# Patient Record
Sex: Male | Born: 1960 | Race: Black or African American | Hispanic: No | Marital: Married | Smoking: Never smoker
Health system: Southern US, Community
[De-identification: ages and names within clinical notes are randomized; demographics above are authoritative.]

## PROBLEM LIST (undated history)

## (undated) DIAGNOSIS — E739 Lactose intolerance, unspecified: Secondary | ICD-10-CM

## (undated) DIAGNOSIS — R079 Chest pain, unspecified: Secondary | ICD-10-CM

## (undated) DIAGNOSIS — S86019A Strain of unspecified Achilles tendon, initial encounter: Secondary | ICD-10-CM

## (undated) DIAGNOSIS — M25519 Pain in unspecified shoulder: Secondary | ICD-10-CM

## (undated) DIAGNOSIS — G5600 Carpal tunnel syndrome, unspecified upper limb: Secondary | ICD-10-CM

## (undated) DIAGNOSIS — R569 Unspecified convulsions: Secondary | ICD-10-CM

## (undated) DIAGNOSIS — K219 Gastro-esophageal reflux disease without esophagitis: Secondary | ICD-10-CM

## (undated) DIAGNOSIS — E669 Obesity, unspecified: Secondary | ICD-10-CM

## (undated) DIAGNOSIS — M2550 Pain in unspecified joint: Secondary | ICD-10-CM

## (undated) DIAGNOSIS — R42 Dizziness and giddiness: Secondary | ICD-10-CM

## (undated) DIAGNOSIS — M549 Dorsalgia, unspecified: Secondary | ICD-10-CM

## (undated) DIAGNOSIS — K5900 Constipation, unspecified: Secondary | ICD-10-CM

## (undated) DIAGNOSIS — R0602 Shortness of breath: Secondary | ICD-10-CM

## (undated) DIAGNOSIS — R200 Anesthesia of skin: Secondary | ICD-10-CM

## (undated) DIAGNOSIS — M722 Plantar fascial fibromatosis: Secondary | ICD-10-CM

## (undated) HISTORY — PX: VASECTOMY: SHX75

## (undated) HISTORY — DX: Shortness of breath: R06.02

## (undated) HISTORY — DX: Anesthesia of skin: R20.0

## (undated) HISTORY — DX: Obesity, unspecified: E66.9

## (undated) HISTORY — DX: Pain in unspecified joint: M25.50

## (undated) HISTORY — DX: Carpal tunnel syndrome, unspecified upper limb: G56.00

## (undated) HISTORY — DX: Pain in unspecified shoulder: M25.519

## (undated) HISTORY — DX: Gastro-esophageal reflux disease without esophagitis: K21.9

## (undated) HISTORY — DX: Unspecified convulsions: R56.9

## (undated) HISTORY — DX: Lactose intolerance, unspecified: E73.9

## (undated) HISTORY — PX: OTHER SURGICAL HISTORY: SHX169

## (undated) HISTORY — PX: HERNIA REPAIR: SHX51

## (undated) HISTORY — DX: Dizziness and giddiness: R42

## (undated) HISTORY — DX: Chest pain, unspecified: R07.9

## (undated) HISTORY — PX: ACHILLES TENDON REPAIR: SUR1153

## (undated) HISTORY — DX: Plantar fascial fibromatosis: M72.2

## (undated) HISTORY — DX: Strain of unspecified achilles tendon, initial encounter: S86.019A

## (undated) HISTORY — DX: Dorsalgia, unspecified: M54.9

## (undated) HISTORY — DX: Constipation, unspecified: K59.00

---

## 2001-12-29 ENCOUNTER — Encounter (INDEPENDENT_AMBULATORY_CARE_PROVIDER_SITE_OTHER): Payer: Self-pay | Admitting: Specialist

## 2001-12-29 ENCOUNTER — Ambulatory Visit (HOSPITAL_COMMUNITY): Admission: RE | Admit: 2001-12-29 | Discharge: 2001-12-29 | Payer: Self-pay | Admitting: Gastroenterology

## 2003-10-10 ENCOUNTER — Encounter: Admission: RE | Admit: 2003-10-10 | Discharge: 2004-01-08 | Payer: Self-pay | Admitting: Family Medicine

## 2004-12-03 ENCOUNTER — Encounter: Admission: RE | Admit: 2004-12-03 | Discharge: 2004-12-03 | Payer: Self-pay | Admitting: Family Medicine

## 2009-02-20 ENCOUNTER — Ambulatory Visit: Payer: Self-pay | Admitting: Family Medicine

## 2009-02-20 DIAGNOSIS — D492 Neoplasm of unspecified behavior of bone, soft tissue, and skin: Secondary | ICD-10-CM

## 2009-04-05 ENCOUNTER — Ambulatory Visit: Payer: Self-pay | Admitting: Family Medicine

## 2009-04-05 DIAGNOSIS — R599 Enlarged lymph nodes, unspecified: Secondary | ICD-10-CM | POA: Insufficient documentation

## 2009-04-08 ENCOUNTER — Telehealth (INDEPENDENT_AMBULATORY_CARE_PROVIDER_SITE_OTHER): Payer: Self-pay | Admitting: *Deleted

## 2009-04-08 LAB — CONVERTED CEMR LAB
ALT: 50 units/L (ref 0–53)
AST: 91 units/L — ABNORMAL HIGH (ref 0–37)
Albumin: 3.5 g/dL (ref 3.5–5.2)
Alkaline Phosphatase: 53 units/L (ref 39–117)
BUN: 14 mg/dL (ref 6–23)
Basophils Absolute: 0 10*3/uL (ref 0.0–0.1)
Basophils Relative: 0 % (ref 0.0–3.0)
Bilirubin, Direct: 0 mg/dL (ref 0.0–0.3)
CO2: 33 meq/L — ABNORMAL HIGH (ref 19–32)
Calcium: 8.9 mg/dL (ref 8.4–10.5)
Chloride: 102 meq/L (ref 96–112)
Cholesterol: 186 mg/dL (ref 0–200)
Creatinine, Ser: 1 mg/dL (ref 0.4–1.5)
Eosinophils Absolute: 0.2 10*3/uL (ref 0.0–0.7)
Eosinophils Relative: 2.6 % (ref 0.0–5.0)
GFR calc non Af Amer: 103.4 mL/min (ref >60)
Glucose, Bld: 80 mg/dL (ref 70–99)
HCT: 46.4 % (ref 39.0–52.0)
HDL: 38.7 mg/dL — ABNORMAL LOW (ref >39.00)
Hemoglobin: 15.6 g/dL (ref 13.0–17.0)
LDL Cholesterol: 134 mg/dL — ABNORMAL HIGH (ref 0–99)
Lymphocytes Relative: 28.9 % (ref 12.0–46.0)
Lymphs Abs: 1.8 10*3/uL (ref 0.7–4.0)
MCHC: 33.6 g/dL (ref 30.0–36.0)
MCV: 88.1 fL (ref 78.0–100.0)
Monocytes Absolute: 0.4 10*3/uL (ref 0.1–1.0)
Monocytes Relative: 6.8 % (ref 3.0–12.0)
Neutro Abs: 3.7 10*3/uL (ref 1.4–7.7)
Neutrophils Relative %: 61.7 % (ref 43.0–77.0)
Platelets: 224 10*3/uL (ref 150.0–400.0)
Potassium: 3.3 meq/L — ABNORMAL LOW (ref 3.5–5.1)
RBC: 5.27 M/uL (ref 4.22–5.81)
RDW: 12.4 % (ref 11.5–14.6)
Sodium: 141 meq/L (ref 135–145)
TSH: 1.23 microintl units/mL (ref 0.35–5.50)
Total Bilirubin: 1.1 mg/dL (ref 0.3–1.2)
Total CHOL/HDL Ratio: 5
Total Protein: 7.1 g/dL (ref 6.0–8.3)
Triglycerides: 67 mg/dL (ref 0.0–149.0)
VLDL: 13.4 mg/dL (ref 0.0–40.0)
WBC: 6.1 10*3/uL (ref 4.5–10.5)

## 2009-04-26 ENCOUNTER — Ambulatory Visit: Payer: Self-pay | Admitting: Family Medicine

## 2009-04-30 ENCOUNTER — Encounter (INDEPENDENT_AMBULATORY_CARE_PROVIDER_SITE_OTHER): Payer: Self-pay | Admitting: *Deleted

## 2009-04-30 LAB — CONVERTED CEMR LAB
ALT: 53 units/L (ref 0–53)
AST: 38 units/L — ABNORMAL HIGH (ref 0–37)
Albumin: 3.9 g/dL (ref 3.5–5.2)
Indirect Bilirubin: 0.4 mg/dL (ref 0.0–0.9)
Total Bilirubin: 0.5 mg/dL (ref 0.3–1.2)
Total Protein: 7.4 g/dL (ref 6.0–8.3)

## 2009-11-08 ENCOUNTER — Encounter: Payer: Self-pay | Admitting: Internal Medicine

## 2010-10-17 ENCOUNTER — Ambulatory Visit (HOSPITAL_BASED_OUTPATIENT_CLINIC_OR_DEPARTMENT_OTHER): Admission: RE | Admit: 2010-10-17 | Discharge: 2010-10-17 | Payer: Self-pay | Admitting: General Surgery

## 2011-03-11 LAB — DIFFERENTIAL
Basophils Absolute: 0 10*3/uL (ref 0.0–0.1)
Basophils Relative: 1 % (ref 0–1)
Eosinophils Absolute: 0.2 10*3/uL (ref 0.0–0.7)
Eosinophils Relative: 2 % (ref 0–5)
Lymphocytes Relative: 35 % (ref 12–46)
Lymphs Abs: 2.4 10*3/uL (ref 0.7–4.0)
Monocytes Absolute: 0.3 10*3/uL (ref 0.1–1.0)
Monocytes Relative: 5 % (ref 3–12)
Neutro Abs: 3.9 10*3/uL (ref 1.7–7.7)
Neutrophils Relative %: 57 % (ref 43–77)

## 2011-03-11 LAB — BASIC METABOLIC PANEL
BUN: 23 mg/dL (ref 6–23)
CO2: 26 mEq/L (ref 19–32)
Calcium: 8.7 mg/dL (ref 8.4–10.5)
Chloride: 102 mEq/L (ref 96–112)
Creatinine, Ser: 0.92 mg/dL (ref 0.4–1.5)
GFR calc Af Amer: >60 mL/min (ref >60)
GFR calc non Af Amer: >60 mL/min (ref >60)
Glucose, Bld: 89 mg/dL (ref 70–99)
Potassium: 3 mEq/L — ABNORMAL LOW (ref 3.5–5.1)
Sodium: 135 mEq/L (ref 135–145)

## 2011-03-11 LAB — CBC
HCT: 45.5 % (ref 39.0–52.0)
Hemoglobin: 15.4 g/dL (ref 13.0–17.0)
MCH: 28.9 pg (ref 26.0–34.0)
MCHC: 33.8 g/dL (ref 30.0–36.0)
MCV: 85.5 fL (ref 78.0–100.0)
Platelets: 243 10*3/uL (ref 150–400)
RBC: 5.32 MIL/uL (ref 4.22–5.81)
RDW: 12.8 % (ref 11.5–15.5)
WBC: 6.8 10*3/uL (ref 4.0–10.5)

## 2011-05-15 NOTE — Op Note (Signed)
[facility]  Patient:    Bryan Lee, Bryan Lee. Visit Number: [id number] MRN: [id number]          Service Type: Attending:  Everardo All. Madilyn Fireman, M.D. Dictated by:   Everardo All Madilyn Fireman, M.D. Proc. Date: 12/29/01   CC:         Leanne Chang, M.D.   Operative Report  PROCEDURE:  Colonoscopy with polypectomy.  INDICATIONS:  Polyps seen on flexible sigmoidoscopy.  DESCRIPTION OF PROCEDURE:  The patient was placed in the left lateral decubitus position and placed on the pulse monitor with continuous low flow oxygen delivered by nasal cannula. He was sedated with 40 mg IV Demerol and 5 mg IV Versed. The Olympus video colonoscope was inserted into the rectum and advanced to cecum, confirmed by transillumination of McBurneys point and visualization of the ileocecal valve and appendiceal orifice. The prep was good. The cecum, ascending, transverse, descending, and sigmoid colon all appeared normal with no masses, polyps, diverticula, or other mucosal abnormalities. Within the rectum was seen a 1.2 cm friable appearing polyp on a short stalk at approximately 10 cm. This was removed by snare in one piece and retrieved through the anus. No other abnormalities in the rectum were noted. The colonoscope was then withdrawn and the patient was returned to the recovery room in stable condition. He tolerated the procedure well and there were no immediate complications.  IMPRESSION:  Large rectal polyp, otherwise normal colonoscopy.  PLAN:  Await histology to rule out malignancy and to determine interval for future colon screening. Dictated by:   Everardo All Madilyn Fireman, M.D. Attending:  Everardo All. Madilyn Fireman, M.D. DD:  12/29/01 TD:  12/29/01 Job: [id number]

## 2011-06-20 ENCOUNTER — Emergency Department (HOSPITAL_COMMUNITY)
Admission: EM | Admit: 2011-06-20 | Discharge: 2011-06-20 | Disposition: A | Discharge status: Home / Self Care | Payer: PRIVATE HEALTH INSURANCE | Attending: Emergency Medicine | Admitting: Emergency Medicine

## 2011-06-20 ENCOUNTER — Emergency Department (HOSPITAL_COMMUNITY): Payer: PRIVATE HEALTH INSURANCE

## 2011-06-20 DIAGNOSIS — W1809XA Striking against other object with subsequent fall, initial encounter: Secondary | ICD-10-CM | POA: Insufficient documentation

## 2011-06-20 DIAGNOSIS — R079 Chest pain, unspecified: Secondary | ICD-10-CM | POA: Insufficient documentation

## 2011-06-20 DIAGNOSIS — M25519 Pain in unspecified shoulder: Secondary | ICD-10-CM | POA: Insufficient documentation

## 2011-06-20 DIAGNOSIS — IMO0002 Reserved for concepts with insufficient information to code with codable children: Secondary | ICD-10-CM | POA: Insufficient documentation

## 2011-06-20 DIAGNOSIS — S20219A Contusion of unspecified front wall of thorax, initial encounter: Secondary | ICD-10-CM | POA: Insufficient documentation

## 2011-06-20 DIAGNOSIS — Y9355 Activity, bike riding: Secondary | ICD-10-CM | POA: Insufficient documentation

## 2011-07-06 ENCOUNTER — Encounter: Payer: Self-pay | Admitting: Family Medicine

## 2011-07-06 ENCOUNTER — Ambulatory Visit (INDEPENDENT_AMBULATORY_CARE_PROVIDER_SITE_OTHER): Payer: PRIVATE HEALTH INSURANCE | Admitting: Family Medicine

## 2011-07-06 DIAGNOSIS — N644 Mastodynia: Secondary | ICD-10-CM | POA: Insufficient documentation

## 2011-07-06 DIAGNOSIS — R0789 Other chest pain: Secondary | ICD-10-CM

## 2011-07-06 DIAGNOSIS — R071 Chest pain on breathing: Secondary | ICD-10-CM

## 2011-07-06 DIAGNOSIS — T148XXA Other injury of unspecified body region, initial encounter: Secondary | ICD-10-CM

## 2011-07-06 MED ORDER — NAPROXEN 500 MG PO TABS: 500.0000 mg | ORAL_TABLET | Freq: Two times a day (BID) | ORAL | Status: AC

## 2011-07-06 NOTE — Patient Instructions (Signed)
We will call you with your ortho appt Apply heat to the lump on your bum Take the Naproxen twice a day for the next 10 days- w/ food to avoid upset stomach Use heat or ice on the shoulder Call with any questions or concerns Hang in there!!!
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
## 2011-07-06 NOTE — Assessment & Plan Note (Signed)
Not actual shoulder pain but more soft tissue, tendon pain.  ? Teres major/lat strain/partial tear.  Start scheduled NSAIDs.  Refer to ortho.  Reviewed supportive care and red flags that should prompt return.  Pt expressed understanding and is in agreement w/ plan.

## 2011-07-06 NOTE — Assessment & Plan Note (Signed)
Soft tissue mass in R superior buttock consistent w/ hematoma.  Start heat, scheduled NSAIDs. Offered [name], pt declined.  Reviewed supportive care and red flags that should prompt return.  Pt expressed understanding and is in agreement w/ plan.
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
## 2013-09-08 ENCOUNTER — Ambulatory Visit (INDEPENDENT_AMBULATORY_CARE_PROVIDER_SITE_OTHER): Payer: PRIVATE HEALTH INSURANCE | Admitting: Family Medicine

## 2013-09-08 ENCOUNTER — Encounter: Payer: Self-pay | Admitting: Family Medicine

## 2013-09-08 VITALS — BP 118/72 | HR 90 | Temp 99.1°F | Ht 66.0 in | Wt 283.2 lb

## 2013-09-08 DIAGNOSIS — Z0000 Encounter for general adult medical examination without abnormal findings: Secondary | ICD-10-CM

## 2013-09-08 DIAGNOSIS — Z1211 Encounter for screening for malignant neoplasm of colon: Secondary | ICD-10-CM

## 2013-09-08 LAB — CBC WITH DIFFERENTIAL/PLATELET
Basophils Absolute: 0 10*3/uL (ref 0.0–0.1)
Basophils Relative: 0 % (ref 0–1)
Eosinophils Absolute: 0.3 10*3/uL (ref 0.0–0.7)
Eosinophils Relative: 3 % (ref 0–5)
HCT: 43.2 % (ref 39.0–52.0)
Hemoglobin: 14.3 g/dL (ref 13.0–17.0)
Lymphocytes Relative: 30 % (ref 12–46)
Lymphs Abs: 2.8 10*3/uL (ref 0.7–4.0)
MCH: 27.6 pg (ref 26.0–34.0)
MCHC: 33.1 g/dL (ref 30.0–36.0)
MCV: 83.2 fL (ref 78.0–100.0)
Monocytes Absolute: 0.6 10*3/uL (ref 0.1–1.0)
Monocytes Relative: 6 % (ref 3–12)
Neutro Abs: 5.6 10*3/uL (ref 1.7–7.7)
Neutrophils Relative %: 61 % (ref 43–77)
Platelets: 305 10*3/uL (ref 150–400)
RBC: 5.19 MIL/uL (ref 4.22–5.81)
RDW: 14 % (ref 11.5–15.5)
WBC: 9.4 10*3/uL (ref 4.0–10.5)

## 2013-09-08 LAB — LIPID PANEL
Cholesterol: 223 mg/dL — ABNORMAL HIGH (ref 0–200)
HDL: 40 mg/dL (ref >39)
LDL Cholesterol: 142 mg/dL — ABNORMAL HIGH (ref 0–99)
Total CHOL/HDL Ratio: 5.6 Ratio
Triglycerides: 205 mg/dL — ABNORMAL HIGH (ref <150)
VLDL: 41 mg/dL — ABNORMAL HIGH (ref 0–40)

## 2013-09-08 LAB — BASIC METABOLIC PANEL
BUN: 16 mg/dL (ref 6–23)
CO2: 30 mEq/L (ref 19–32)
Calcium: 9.6 mg/dL (ref 8.4–10.5)
Chloride: 98 mEq/L (ref 96–112)
Creat: 0.89 mg/dL (ref 0.50–1.35)
Glucose, Bld: 95 mg/dL (ref 70–99)
Potassium: 3.5 mEq/L (ref 3.5–5.3)
Sodium: 136 mEq/L (ref 135–145)

## 2013-09-08 LAB — HEPATIC FUNCTION PANEL
ALT: 35 U/L (ref 0–53)
AST: 29 U/L (ref 0–37)
Albumin: 4 g/dL (ref 3.5–5.2)
Alkaline Phosphatase: 50 U/L (ref 39–117)
Bilirubin, Direct: 0.1 mg/dL (ref 0.0–0.3)
Indirect Bilirubin: 0.3 mg/dL (ref 0.0–0.9)
Total Bilirubin: 0.4 mg/dL (ref 0.3–1.2)
Total Protein: 7.4 g/dL (ref 6.0–8.3)

## 2013-09-08 MED ORDER — TRAMADOL HCL 50 MG PO TABS: 50.0000 mg | ORAL_TABLET | Freq: Three times a day (TID) | ORAL | Status: DC | PRN

## 2013-09-08 NOTE — Assessment & Plan Note (Signed)
Pt's PE WNL w/ exception of L shoulder pain and obesity.  Check labs.  Pt due for colonoscopy- will refer to GI.  EKG done- see document for interpretation.  Anticipatory guidance provided.
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
## 2013-09-08 NOTE — Patient Instructions (Addendum)
We'll notify you of your lab results and make any changes if needed Go see Ortho about your shoulder Use the Tramadol at night Call with any questions or concerns Hang in there!!

## 2013-09-09 LAB — PSA: PSA: 1.66 ng/mL (ref <=4.00)

## 2013-09-09 LAB — TSH: TSH: 0.769 u[IU]/mL (ref 0.350–4.500)

## 2013-09-13 ENCOUNTER — Encounter: Payer: Self-pay | Admitting: General Practice

## 2013-09-13 ENCOUNTER — Other Ambulatory Visit: Payer: Self-pay | Admitting: General Practice

## 2013-09-13 MED ORDER — ATORVASTATIN CALCIUM 20 MG PO TABS: 20.0000 mg | ORAL_TABLET | Freq: Every day | ORAL | Status: DC

## 2013-09-18 ENCOUNTER — Encounter: Payer: Self-pay | Admitting: Family Medicine

## 2013-10-09 ENCOUNTER — Other Ambulatory Visit: Payer: Self-pay | Admitting: Family Medicine

## 2013-10-09 NOTE — Telephone Encounter (Signed)
Last filled-09/08/2013  Last visit-09/08/2013  Please advise SW

## 2013-10-10 ENCOUNTER — Other Ambulatory Visit: Payer: Self-pay | Admitting: Family Medicine

## 2013-10-10 NOTE — Telephone Encounter (Signed)
This medication was faxed and called in on 10/13,

## 2013-11-02 ENCOUNTER — Other Ambulatory Visit: Payer: Self-pay

## 2014-03-20 ENCOUNTER — Encounter: Payer: Self-pay | Admitting: Family Medicine

## 2014-04-09 ENCOUNTER — Encounter: Payer: Self-pay | Admitting: Family Medicine

## 2014-04-09 ENCOUNTER — Ambulatory Visit (INDEPENDENT_AMBULATORY_CARE_PROVIDER_SITE_OTHER): Payer: PRIVATE HEALTH INSURANCE | Admitting: Family Medicine

## 2014-04-09 VITALS — BP 124/90 | HR 74 | Temp 98.1°F | Resp 16 | Wt 269.2 lb

## 2014-04-09 DIAGNOSIS — M25511 Pain in right shoulder: Secondary | ICD-10-CM

## 2014-04-09 DIAGNOSIS — M25519 Pain in unspecified shoulder: Secondary | ICD-10-CM

## 2014-04-09 DIAGNOSIS — M25512 Pain in left shoulder: Secondary | ICD-10-CM | POA: Insufficient documentation

## 2014-04-09 MED ORDER — TRAMADOL HCL 50 MG PO TABS: ORAL_TABLET | ORAL | Status: DC

## 2014-04-09 MED ORDER — MELOXICAM 15 MG PO TABS: 15.0000 mg | ORAL_TABLET | Freq: Every day | ORAL | Status: DC

## 2014-04-09 NOTE — Progress Notes (Signed)
Pre visit review using our clinic review tool, if applicable. No additional management support is needed unless otherwise documented below in the visit note. 

## 2014-04-09 NOTE — Assessment & Plan Note (Signed)
New.  Concern for shoulder arthropathy.  Start daily NSAIDs, tramadol for night pain.  Refer to ortho.  Reviewed supportive care and red flags that should prompt return.  Pt expressed understanding and is in agreement w/ plan.

## 2014-04-09 NOTE — Patient Instructions (Signed)
Follow up in 1-2 months to recheck cholesterol We'll call you with your ortho appt Start the Mobic daily for inflammation Use the Tramadol as needed for pain ICE! Call with any questions or concerns Hang in there!

## 2014-04-09 NOTE — Progress Notes (Signed)
   Subjective:    Patient ID: Bryan Lee, male    DOB: 05/17/1961, 53 y.o.   MRN: [id number]  HPI R shoulder pain- unable to sleep due to severity of pain.  sxs started worsening 1 month ago.  Pain doesn't worsen w/ motion it 'just hurts'.  Not taking any medication for pain.  No known injury.  R hand dominant.  No change in activity recently.  + numbness radiating down arm and into hand (known hx of carpal tunnel).  Pain is constant at a 5 but will worsen to 10 at night.   Review of Systems For ROS see HPI     Objective:   Physical Exam  Vitals reviewed. Constitutional: He appears well-developed and well-nourished. No distress.  Cardiovascular: Intact distal pulses.   Musculoskeletal: He exhibits no edema.  R shoulder pain w/o focal TTP over clavicle, scapula, head of humerus Normal ROM of R shoulder, mild pain w/ impingement signs but pt reports the pain is actually unchanged from baseline  Neurological: He has normal reflexes. No cranial nerve deficit. Coordination normal.          Assessment & Plan:

## 2014-04-10 ENCOUNTER — Encounter: Payer: Self-pay | Admitting: Family Medicine

## 2014-04-10 MED ORDER — NONFORMULARY OR COMPOUNDED ITEM: Status: DC

## 2015-11-02 ENCOUNTER — Encounter: Payer: Self-pay | Admitting: Family Medicine

## 2015-11-02 ENCOUNTER — Ambulatory Visit (INDEPENDENT_AMBULATORY_CARE_PROVIDER_SITE_OTHER): Payer: 59 | Admitting: Family Medicine

## 2015-11-02 VITALS — BP 120/64 | HR 74 | Temp 98.4°F | Ht 66.0 in | Wt 289.5 lb

## 2015-11-02 DIAGNOSIS — R202 Paresthesia of skin: Secondary | ICD-10-CM | POA: Diagnosis not present

## 2015-11-02 NOTE — Progress Notes (Signed)
Subjective:   Patient ID: Bryan Lee, male    DOB: 01/05/1961, 54 y.o.   MRN: [id number]  Bryan Lee is a pleasant 54 y.o. year old male pt pt of Dr. Birdie Riddle, new to me, who presents to weekend clinic today with Numbness  and several other complaints on 11/02/2015  HPI:  Right leg numbness- extends from lateral thigh down to his lower leg.  Has been ongoing for 6 or more months.  No known injury.  Nothing seems to make it better or worse.  Does not wake him up at night.  Feels like "pins and needles" and "hot and cold." No back pain. No dysuria.  No blood in stool.  Does not take any medication regularly other than flomax for BPH (followed by [facility]).  Obesity- says he wants to see his PCP and he loves her but since she is changing offices, he cannot get in with her until 2017.  Interested in discussing weight loss options.     Current Outpatient Prescriptions on File Prior to Visit  Medication Sig Dispense Refill  . NONFORMULARY OR COMPOUNDED ITEM Therapeutic Massage as needed for neck pain 1 each 5   No current facility-administered medications on file prior to visit.    No Known Allergies  Past Medical History  Diagnosis Date  . Lipoma     Past Surgical History  Procedure Laterality Date  . Hernia repair    . Achilles tendon repair      left  . Vasectomy      Family History  Problem Relation Age of Onset  . Coronary artery disease Father   . Heart failure Father   . Diabetes Mother   . Diabetes      grandmother  . Hypertension Father     Social History   Social History  . Marital Status: Married    Spouse Name: N/A  . Number of Children: N/A  . Years of Education: N/A   Occupational History  . Not on file.   Social History Main Topics  . Smoking status: Never Smoker   . Smokeless tobacco: Not on file  . Alcohol Use: Yes     Comment: social  . Drug Use: No  . Sexual Activity: Not on file   Other Topics Concern  . Not on file     Social History Narrative   The PMH, PSH, Social History, Family History, Medications, and allergies have been reviewed in [facility], and have been updated if relevant.   Review of Systems  Constitutional: Negative.   HENT: Negative.   Respiratory: Negative.   Cardiovascular: Negative.   Gastrointestinal: Negative.   Genitourinary: Negative.   Musculoskeletal: Negative for back pain and gait problem.  Neurological: Positive for numbness. Negative for dizziness, tremors, seizures, syncope, facial asymmetry, speech difficulty, weakness, light-headedness and headaches.  Hematological: Negative.   Psychiatric/Behavioral: Negative.   All other systems reviewed and are negative.      Objective:    BP 120/64 mmHg  Pulse 74  Temp(Src) 98.4 F (36.9 C) (Oral)  Ht 5\' 6"  (1.676 m)  Wt 289 lb 8 oz (131.316 kg)  BMI 46.75 kg/m2  SpO2 93%   Physical Exam  Constitutional: He is oriented to person, place, and time. He appears well-developed and well-nourished. No distress.  obese  HENT:  Head: Normocephalic.  Eyes: Conjunctivae are normal.  Cardiovascular: Normal rate.   Pulmonary/Chest: Effort normal.  Musculoskeletal:  SLR positive right, mildly positive Faber's right Sensation of  right leg and thigh in tact Reflexes symmetrical No TTP over spine   Neurological: He is alert and oriented to person, place, and time. No cranial nerve deficit.  Skin: Skin is warm and dry.  Psychiatric: He has a normal mood and affect. His behavior is normal. Judgment and thought content normal.  Nursing note and vitals reviewed.         Assessment & Plan:   Paresthesia of right leg  Morbid obesity, unspecified obesity type ([place]) No Follow-up on file.

## 2015-11-02 NOTE — Patient Instructions (Signed)
Great to see you. Please call my office on Monday- [phone] and let them know that I said you could establish with me this week.

## 2015-11-02 NOTE — Assessment & Plan Note (Signed)
Chronic issue-  >25 minutes spent in face to face time with patient, >50% spent in counselling or coordination of care discussing paresthesias and weight loss. Pinched/inflammed nerve likely coming from lower LS spine.  Exam reassuring otherwise and he is aware that his weight is playing a roll. Explained that I will not prescribe weight loss drugs since I am not his PCP.  He asked if he could transfer care to me, which needs to be cleared by his PCP first.   See AVS.  He does not want imaging or rx (prednsione, elavil) for his parethesia at this time. I did discuss red flag symptoms.

## 2015-11-07 ENCOUNTER — Telehealth: Payer: Self-pay | Admitting: *Deleted

## 2015-11-07 ENCOUNTER — Other Ambulatory Visit: Payer: Self-pay | Admitting: General Practice

## 2015-11-07 ENCOUNTER — Ambulatory Visit (INDEPENDENT_AMBULATORY_CARE_PROVIDER_SITE_OTHER): Payer: 59 | Admitting: Family Medicine

## 2015-11-07 ENCOUNTER — Encounter: Payer: Self-pay | Admitting: Family Medicine

## 2015-11-07 DIAGNOSIS — R202 Paresthesia of skin: Secondary | ICD-10-CM

## 2015-11-07 DIAGNOSIS — E876 Hypokalemia: Secondary | ICD-10-CM

## 2015-11-07 LAB — CBC WITH DIFFERENTIAL/PLATELET
BASOS ABS: 0 10*3/uL (ref 0.0–0.1)
Basophils Relative: 0.2 % (ref 0.0–3.0)
EOS ABS: 0.2 10*3/uL (ref 0.0–0.7)
Eosinophils Relative: 2.7 % (ref 0.0–5.0)
HCT: 46.1 % (ref 39.0–52.0)
HEMOGLOBIN: 15.3 g/dL (ref 13.0–17.0)
LYMPHS ABS: 1.9 10*3/uL (ref 0.7–4.0)
Lymphocytes Relative: 21.1 % (ref 12.0–46.0)
MCHC: 33.1 g/dL (ref 30.0–36.0)
MCV: 83.3 fl (ref 78.0–100.0)
MONO ABS: 0.5 10*3/uL (ref 0.1–1.0)
Monocytes Relative: 5.2 % (ref 3.0–12.0)
NEUTROS PCT: 70.8 % (ref 43.0–77.0)
Neutro Abs: 6.2 10*3/uL (ref 1.4–7.7)
Platelets: 329 10*3/uL (ref 150.0–400.0)
RBC: 5.54 Mil/uL (ref 4.22–5.81)
RDW: 14 % (ref 11.5–15.5)
WBC: 8.8 10*3/uL (ref 4.0–10.5)

## 2015-11-07 LAB — LIPID PANEL
CHOL/HDL RATIO: 6
CHOLESTEROL: 219 mg/dL — AB (ref 0–200)
HDL: 34.5 mg/dL — AB (ref >39.00)
LDL Cholesterol: 148 mg/dL — ABNORMAL HIGH (ref 0–99)
NonHDL: 184.53
TRIGLYCERIDES: 183 mg/dL — AB (ref 0.0–149.0)
VLDL: 36.6 mg/dL (ref 0.0–40.0)

## 2015-11-07 LAB — HEPATIC FUNCTION PANEL
ALBUMIN: 3.8 g/dL (ref 3.5–5.2)
ALK PHOS: 54 U/L (ref 39–117)
ALT: 31 U/L (ref 0–53)
AST: 27 U/L (ref 0–37)
Bilirubin, Direct: 0.1 mg/dL (ref 0.0–0.3)
TOTAL PROTEIN: 7.8 g/dL (ref 6.0–8.3)
Total Bilirubin: 0.5 mg/dL (ref 0.2–1.2)

## 2015-11-07 LAB — BASIC METABOLIC PANEL
BUN: 10 mg/dL (ref 6–23)
CHLORIDE: 95 meq/L — AB (ref 96–112)
CO2: 35 mEq/L — ABNORMAL HIGH (ref 19–32)
CREATININE: 0.88 mg/dL (ref 0.40–1.50)
Calcium: 9.7 mg/dL (ref 8.4–10.5)
GFR: 116.63 mL/min (ref >60.00)
GLUCOSE: 123 mg/dL — AB (ref 70–99)
POTASSIUM: 2.7 meq/L — AB (ref 3.5–5.1)
Sodium: 139 mEq/L (ref 135–145)

## 2015-11-07 LAB — TSH: TSH: 0.96 u[IU]/mL (ref 0.35–4.50)

## 2015-11-07 LAB — HEMOGLOBIN A1C: Hgb A1c MFr Bld: 5.8 % (ref 4.6–6.5)

## 2015-11-07 MED ORDER — PHENTERMINE-TOPIRAMATE ER 3.75-23 MG PO CP24: ORAL_CAPSULE | ORAL | Status: DC

## 2015-11-07 MED ORDER — POTASSIUM CHLORIDE CRYS ER 20 MEQ PO TBCR: 20.0000 meq | EXTENDED_RELEASE_TABLET | Freq: Every day | ORAL | Status: DC

## 2015-11-07 NOTE — Telephone Encounter (Signed)
elam lab reporting critical -- potassium @ 2.7

## 2015-11-07 NOTE — Progress Notes (Signed)
   Subjective:    Patient ID: Bryan Lee, male    DOB: Aug 16, 1961, 54 y.o.   MRN: [id number]  HPI Leg pain/numbness- pt was seen on 11/5 at Saturday clinic for R leg numbness.  Pt was having intermittent leg pain/numbness while undergoing PT and PT recommended he see MD.  Was told that he needs to lose weight to take pressure off L spine.  No interest in pred taper, gabapentin.  'i know what I need to do, I need to lose weight'.  Obesity- chronic problem, pt has done research into weight loss options and is asking for medication to assist w/ this.  Pt is particularly interested in Qsymia.  Not interested in bariatric surgery.  Pt is having difficulty exercising due to weight.  Pt is not interested in nutrition at this time.  Denies CP, SOB, HAs, visual changes, edema.   Review of Systems For ROS see HPI     Objective:   Physical Exam  Constitutional: He is oriented to person, place, and time. He appears well-developed and well-nourished. No distress.  obese  HENT:  Head: Normocephalic and atraumatic.  Eyes: Conjunctivae and EOM are normal. Pupils are equal, round, and reactive to light.  Neck: Normal range of motion. Neck supple. No thyromegaly present.  Cardiovascular: Normal rate, regular rhythm, normal heart sounds and intact distal pulses.   Pulmonary/Chest: Effort normal and breath sounds normal. No respiratory distress. He has no wheezes. He has no rales.  Lymphadenopathy:    He has no cervical adenopathy.  Neurological: He is alert and oriented to person, place, and time. He has normal reflexes. No cranial nerve deficit. Coordination normal.  Skin: Skin is warm and dry.  Psychiatric: He has a normal mood and affect. His behavior is normal. Thought content normal.  Vitals reviewed.         Assessment & Plan:

## 2015-11-07 NOTE — Progress Notes (Signed)
Pre visit review using our clinic review tool, if applicable. No additional management support is needed unless otherwise documented below in the visit note. 

## 2015-11-07 NOTE — Patient Instructions (Signed)
Follow up in 6-8 weeks to recheck weight loss progress We'll notify you of your lab results and make any changes if needed Start the Qsymia as directed- 1 capsule daily x2 weeks and then increase to 2 capsules daily Continue to work on healthy diet and regular exercise Call with any questions or concerns If you want to join [name] at the new [place] office, any scheduled appointments will automatically transfer and we will see you at [address] [name] [address] Bryan Lee [place], [place] [address]  Happy Holidays!!!

## 2015-11-10 NOTE — Assessment & Plan Note (Signed)
New to provider, ongoing for pt.  He is not interested in Prednisone or gabapentin.  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  Reviewed supportive care and red flags that should prompt return.  Pt expressed understanding and is in agreement w/ plan.

## 2015-11-10 NOTE — Assessment & Plan Note (Signed)
Ongoing struggle for pt.  He had previously lost quite a bit of weight but has regained most of it.  Check labs to risk stratify.  Discussed medication options- he wants to try Qsymia.  Prescription given.  Offered nutrition referral- pt not interested at this time.  Stressed need for healthy diet and regular exercise.  Will continue to follow.

## 2016-06-01 ENCOUNTER — Encounter: Payer: Self-pay | Admitting: Family Medicine

## 2016-07-08 ENCOUNTER — Telehealth: Payer: Self-pay | Admitting: General Practice

## 2016-07-08 MED ORDER — PHENTERMINE-TOPIRAMATE ER 3.75-23 MG PO CP24: ORAL_CAPSULE | ORAL | Status: DC

## 2016-07-08 NOTE — Telephone Encounter (Signed)
[place] for #30, needs OV

## 2016-07-08 NOTE — Telephone Encounter (Signed)
Medication filled to pharmacy as requested.   

## 2016-07-08 NOTE — Telephone Encounter (Signed)
Last OV 11/07/15 qsymia 11/07/15 #60 with 3  No follow up from first round of weight loss medications

## 2016-09-03 ENCOUNTER — Other Ambulatory Visit: Payer: Self-pay | Admitting: Family Medicine

## 2016-09-03 MED ORDER — PHENTERMINE-TOPIRAMATE ER 3.75-23 MG PO CP24: ORAL_CAPSULE | ORAL | 0 refills | Status: DC

## 2016-09-03 NOTE — Telephone Encounter (Signed)
[place] for #60, no refill w/o appt

## 2016-09-03 NOTE — Telephone Encounter (Signed)
Last OV 11/07/15 (no upcoming appts and pt was not seen for 6-8 week follow up) Qsymia last filled 07/08/16 #60 with 0   Not on med to make appt for weight loss follow up.   Please advise.

## 2017-01-21 ENCOUNTER — Other Ambulatory Visit: Payer: Self-pay | Admitting: Family Medicine

## 2017-02-01 ENCOUNTER — Ambulatory Visit (INDEPENDENT_AMBULATORY_CARE_PROVIDER_SITE_OTHER): Payer: Managed Care, Other (non HMO) | Admitting: Physician Assistant

## 2017-02-01 VITALS — BP 124/82 | HR 75 | Temp 98.6°F | Resp 18 | Ht 66.0 in | Wt 303.0 lb

## 2017-02-01 DIAGNOSIS — N61 Mastitis without abscess: Secondary | ICD-10-CM | POA: Diagnosis not present

## 2017-02-01 MED ORDER — AMOXICILLIN-POT CLAVULANATE 875-125 MG PO TABS: 1.0000 | ORAL_TABLET | Freq: Two times a day (BID) | ORAL | 0 refills | Status: DC
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
## 2017-02-01 NOTE — Patient Instructions (Addendum)
Take the antibiotic with food to reduce the risk of GI upset.  Contact your insurance plan to see what the criteria are to qualify for their coverage of breast reduction surgery.    IF you received an x-ray today, you will receive an invoice from [facility]. Please contact [facility] at [phone] with questions or concerns regarding your invoice.   IF you received labwork today, you will receive an invoice from [facility]. Please contact [facility] at [phone] with questions or concerns regarding your invoice.   Our billing staff will not be able to assist you with questions regarding bills from these companies.  You will be contacted with the lab results as soon as they are available. The fastest way to get your results is to activate your My Chart account. Instructions are located on the last page of this paperwork. If you have not heard from [name] regarding the results in 2 weeks, please contact this office.

## 2017-02-02 ENCOUNTER — Encounter: Payer: Self-pay | Admitting: Physician Assistant

## 2017-02-12 ENCOUNTER — Ambulatory Visit (INDEPENDENT_AMBULATORY_CARE_PROVIDER_SITE_OTHER): Payer: Managed Care, Other (non HMO) | Admitting: Family Medicine

## 2017-02-12 VITALS — BP 112/70 | HR 73 | Temp 98.7°F | Resp 16 | Ht 66.0 in | Wt 302.0 lb

## 2017-02-12 DIAGNOSIS — H6123 Impacted cerumen, bilateral: Secondary | ICD-10-CM | POA: Diagnosis not present

## 2017-02-12 NOTE — Patient Instructions (Addendum)
Return for care as needed.   IF you received an x-ray today, you will receive an invoice from [facility]. Please contact [facility] at [phone] with questions or concerns regarding your invoice.   IF you received labwork today, you will receive an invoice from [place] [facility]. Please contact [facility] at [phone] with questions or concerns regarding your invoice.   Our billing staff will not be able to assist you with questions regarding bills from these companies.  You will be contacted with the lab results as soon as they are available. The fastest way to get your results is to activate your My Chart account. Instructions are located on the last page of this paperwork. If you have not heard from [name] regarding the results in 2 weeks, please contact this office.      Earwax Buildup Your ears make a substance called earwax. It may also be called cerumen. Sometimes, too much earwax builds up in your ear canal. This can cause ear pain and make it harder for you to hear. CAUSES This condition is caused by too much earwax production or buildup. RISK FACTORS The following factors may make you more likely to develop this condition:  Cleaning your ears often with swabs.  Having narrow ear canals.  Having earwax that is overly thick or sticky.  Having eczema.  Being dehydrated. SYMPTOMS Symptoms of this condition include:  Reduced hearing.  Ear drainage.  Ear pain.  Ear itch.  A feeling of fullness in the ear or feeling that the ear is plugged.  Ringing in the ear.  Coughing. DIAGNOSIS Your health care provider can diagnose this condition based on your symptoms and medical history. Your health care provider will also do an ear exam to look inside your ear with a scope (otoscope). You may also have a hearing test. TREATMENT Treatment for this condition includes:  Over-the-counter or prescription ear drops to soften the earwax.  Earwax removal by a health  care provider. This may be done:  By flushing the ear with body-temperature water.  With a medical instrument that has a loop at the end (earwax curette).  With a suction device. HOME CARE INSTRUCTIONS  Take over-the-counter and prescription medicines only as told by your health care provider.  Do not put any objects, including an ear swab, into your ear. You can clean the opening of your ear canal with a washcloth.  Drink enough water to keep your urine clear or pale yellow.  If you have frequent earwax buildup or you use hearing aids, consider seeing your health care provider every 6-12 months for routine preventive ear cleanings. Keep all follow-up visits as told by your health care provider. SEEK MEDICAL CARE IF:  You have ear pain.  Your condition does not improve with treatment.  You have hearing loss.  You have blood, pus, or other fluid coming from your ear. This information is not intended to replace advice given to you by your health care provider. Make sure you discuss any questions you have with your health care provider. Document Released: 01/21/2005 Document Revised: 04/06/2016 Document Reviewed: 07/31/2015 Elsevier Interactive Patient Education  2017 [employer].

## 2017-02-12 NOTE — Progress Notes (Signed)
Patient ID: Bryan Lee, male    DOB: Jul 29, 1961, 56 y.o.   MRN: [id number]  PCP: Annye Asa, MD  Chief Complaint  Patient presents with  . Ear Fullness    Bilateral     Subjective:  HPI  56 year old male presents for evaluation of bilateral ear impaction. He reports diminished hearing bilateral ears. Left ear feels more occluded compared to right. Reports previous episodes of ear impactions. Denies dizziness or ringing in the ear. He has tried peroxide otic drops without improvement of ear wax removal.  Social History   Social History  . Marital status: Married    Spouse name: Rosemarie Ax  . Number of children: 2  . Years of education: Master's Degree   Occupational History  . VP Operations     [employer]   Social History Main Topics  . Smoking status: Never Smoker  . Smokeless tobacco: Never Used  . Alcohol use Yes     Comment: social  . Drug use: No  . Sexual activity: Not on file   Other Topics Concern  . Not on file   Social History Narrative   Lives with his wife and children.    Family History  Problem Relation Age of Onset  . Coronary artery disease Father   . Heart failure Father   . Hypertension Father   . Arthritis Mother   . Diabetes      grandmother  . Cancer Sister     GYN-type cancer  . GER disease Daughter   . GER disease Son   . Diabetes Maternal Grandmother   . Cancer Maternal Grandmother     ovarian  . Cancer Paternal Grandmother     lung cancer, + tobacco  . Seizures Sister    Review of Systems See HPI Patient Active Problem List   Diagnosis Date Noted  . Paresthesia of right leg 11/02/2015  . Morbid obesity ([place]) 11/02/2015  . Shoulder pain, right 04/09/2014  . Routine general medical examination at a health care facility 09/08/2013  . Hematoma 07/06/2011  . Chest wall pain 07/06/2011  . LYMPH NODE-ENLARGED 04/05/2009  . NEOPLASMS UNSPEC NATURE BONE SOFT TISSUE&SKIN 02/20/2009    No Known Allergies  Prior  to Admission medications   Medication Sig Start Date End Date Taking? Authorizing Provider  amoxicillin-clavulanate (AUGMENTIN) 875-125 MG tablet Take 1 tablet by mouth 2 (two) times daily. 02/01/17  Yes Chelle Jeffery, PA-C  KLOR-CON M20 20 MEQ tablet TAKE ONE TABLET BY MOUTH ONCE DAILY Patient not taking: Reported on 02/12/2017 01/21/17   Midge Minium, MD  Phentermine-Topiramate 3.75-23 MG CP24 1 capsule daily x2 weeks and then increase to 2 capsules daily Patient not taking: Reported on 02/12/2017 09/03/16   Midge Minium, MD  tamsulosin (FLOMAX) 0.4 MG CAPS capsule Take 0.4 mg by mouth.    Historical Provider, MD    Past Medical, Surgical Family and Social History reviewed and updated.    Objective:   Today's Vitals   02/12/17 1208  BP: 112/70  Pulse: 73  Resp: 16  Temp: 98.7 F (37.1 C)  TempSrc: Oral  SpO2: 94%  Weight: (!) 302 lb (137 kg)  Height: 5\' 6"  (1.676 m)    Wt Readings from Last 3 Encounters:  02/12/17 (!) 302 lb (137 kg)  02/01/17 (!) 303 lb (137.4 kg)  11/07/15 289 lb 6 oz (131.3 kg)    Physical Exam  Constitutional: He is oriented to person, place, and time. He appears  well-developed and well-nourished.  HENT:  Head: Normocephalic and atraumatic.  Bilateral thick brownish/yellowish cerumen in ears.  Eyes: Conjunctivae and EOM are normal. Pupils are equal, round, and reactive to light.  Neck: Normal range of motion.  Cardiovascular: Normal rate.   Pulmonary/Chest: Effort normal.  Musculoskeletal: Normal range of motion.  Neurological: He is alert and oriented to person, place, and time.  Skin: Skin is warm and dry.  Psychiatric: He has a normal mood and affect. His behavior is normal. Judgment and thought content normal.      Assessment & Plan:  1. Hearing loss of both ears due to cerumen impaction -Patient tolerated with improvement of hearing bilateral ears.    Carroll Sage. Kenton Kingfisher, MSN, FNP-C [facility] at [place]

## 2017-02-18 ENCOUNTER — Encounter (INDEPENDENT_AMBULATORY_CARE_PROVIDER_SITE_OTHER): Payer: Managed Care, Other (non HMO) | Admitting: Family Medicine

## 2017-02-19 ENCOUNTER — Ambulatory Visit (INDEPENDENT_AMBULATORY_CARE_PROVIDER_SITE_OTHER): Payer: Managed Care, Other (non HMO) | Admitting: Family Medicine

## 2017-02-19 VITALS — BP 120/78 | HR 74 | Temp 98.8°F | Resp 16 | Ht 65.0 in | Wt 298.9 lb

## 2017-02-19 DIAGNOSIS — Z1212 Encounter for screening for malignant neoplasm of rectum: Secondary | ICD-10-CM

## 2017-02-19 DIAGNOSIS — Z23 Encounter for immunization: Secondary | ICD-10-CM

## 2017-02-19 DIAGNOSIS — Z1211 Encounter for screening for malignant neoplasm of colon: Secondary | ICD-10-CM | POA: Diagnosis not present

## 2017-02-19 DIAGNOSIS — Z113 Encounter for screening for infections with a predominantly sexual mode of transmission: Secondary | ICD-10-CM | POA: Diagnosis not present

## 2017-02-19 NOTE — Progress Notes (Signed)
Subjective:  By signing my name below, I, [name], attest that this documentation has been prepared under the direction and in the presence of Delman Cheadle, MD Electronically Signed: Ladene Artist, ED Scribe 02/19/2017 at 4:29 PM.   Patient ID: Bryan Lee, male    DOB: [birth year], 56 y.o.   MRN: [id number]  Chief Complaint  Patient presents with  . Bloodwork   HPI Bryan Lee is a 56 y.o. male who presents to Primary Care at [facility] for blood work. Pt states that he logged into MyChart which showed that he was overdue for a tetanus and hep screen. He is unsure when his last tetanus was. Pt's Health Maintenance also shows that he is overdue for a colonoscopy but he believes he had one before the age of 59. Per chart review, pt last had a colonoscopy done in 2003 that showed a rectal polyp. He is not fasting at this visit but has an upcoming appointment next week.   PCP: Annye Asa, MD  Past Medical History:  Diagnosis Date  . Lipoma    Current Outpatient Prescriptions on File Prior to Visit  Medication Sig Dispense Refill  . KLOR-CON M20 20 MEQ tablet TAKE ONE TABLET BY MOUTH ONCE DAILY (Patient not taking: Reported on 02/12/2017) 30 tablet 0  . Phentermine-Topiramate 3.75-23 MG CP24 1 capsule daily x2 weeks and then increase to 2 capsules daily (Patient not taking: Reported on 02/12/2017) 60 capsule 0  . tamsulosin (FLOMAX) 0.4 MG CAPS capsule Take 0.4 mg by mouth.     No current facility-administered medications on file prior to visit.    No Known Allergies  Past Surgical History:  Procedure Laterality Date  . ACHILLES TENDON REPAIR     left  . HERNIA REPAIR    . lump removal    . VASECTOMY     Family History  Problem Relation Age of Onset  . Coronary artery disease Father   . Heart failure Father   . Hypertension Father   . Sleep apnea Father   . Heart disease Father   . Arthritis Mother   . Obesity Mother   . Diabetes      grandmother  . Cancer  Sister     GYN-type cancer  . GER disease Daughter   . GER disease Son   . Diabetes Maternal Grandmother   . Cancer Maternal Grandmother     ovarian  . Cancer Paternal Grandmother     lung cancer, + tobacco  . Seizures Sister    Social History   Social History  . Marital status: Married    Spouse name: Rosemarie Ax  . Number of children: 2  . Years of education: Master's Degree   Occupational History  . VP Operations     [employer]   Social History Main Topics  . Smoking status: Never Smoker  . Smokeless tobacco: Never Used  . Alcohol use Yes     Comment: social  . Drug use: No  . Sexual activity: Yes    Partners: Female    Birth control/ protection: Surgical   Other Topics Concern  . None   Social History Narrative   Lives with his wife and children.    Review of Systems  Constitutional: Negative for activity change, appetite change, chills, diaphoresis, fatigue, fever and unexpected weight change.  Gastrointestinal: Negative for abdominal distention, abdominal pain, anal bleeding, blood in stool, constipation, diarrhea, nausea, rectal pain and vomiting.  Skin: Negative for color change,  rash and wound.  Allergic/Immunologic: Negative for immunocompromised state.  Hematological: Negative for adenopathy.      Objective:   Physical Exam  Constitutional: He is oriented to person, place, and time. He appears well-developed and well-nourished. No distress.  HENT:  Head: Normocephalic and atraumatic.  Eyes: Conjunctivae and EOM are normal.  Neck: Neck supple. No tracheal deviation present.  Cardiovascular: Normal rate.   Pulmonary/Chest: Effort normal. No respiratory distress.  Musculoskeletal: Normal range of motion.  Neurological: He is alert and oriented to person, place, and time.  Skin: Skin is warm and dry.  Psychiatric: He has a normal mood and affect. His behavior is normal.  Nursing note and vitals reviewed.  BP 120/78   Pulse 74   Temp 98.8 F (37.1  C) (Oral)   Resp 16   Ht 5\' 5"  (1.651 m)   Wt 298 lb 14.4 oz (135.6 kg)   SpO2 94%   BMI 49.74 kg/m     Assessment & Plan:   1. Need for diphtheria-tetanus-pertussis (Tdap) vaccine   2. Screening for colorectal cancer   3. Routine screening for STI (sexually transmitted infection)   Pt received one of the routine updates over MyChart that he had outstanding health maintenance items and so is here today to get those completed.  Orders Placed This Encounter  Procedures  . Tdap vaccine greater than or equal to 7yo IM  . HIV antibody  . HCV Ab w/Rflx to Verification  . Interpretation:  . Ambulatory referral to Gastroenterology    Referral Priority:   Routine    Referral Type:   Consultation    Referral Reason:   Specialty Services Required    Number of Visits Requested:   1  . Care order/instruction:    Scheduling Instructions:     Complete orders, AVS and go.     I personally performed the services described in this documentation, which was scribed in my presence. The recorded information has been reviewed and considered, and addended by me as needed.   Delman Cheadle, M.D.  Primary Care at [facility] [address] [place], [place] [address] [phone] phone [phone] fax  03/25/17 11:04 PM

## 2017-02-19 NOTE — Patient Instructions (Signed)
     IF you received an x-ray today, you will receive an invoice from [facility]. Please contact [facility] at [phone] with questions or concerns regarding your invoice.   IF you received labwork today, you will receive an invoice from [facility]. Please contact [facility] at [phone] with questions or concerns regarding your invoice.   Our billing staff will not be able to assist you with questions regarding bills from these companies.  You will be contacted with the lab results as soon as they are available. The fastest way to get your results is to activate your My Chart account. Instructions are located on the last page of this paperwork. If you have not heard from us regarding the results in 2 weeks, please contact this office.     

## 2017-02-20 LAB — HIV ANTIBODY (ROUTINE TESTING W REFLEX): HIV Screen 4th Generation wRfx: NONREACTIVE

## 2017-02-20 LAB — HCV AB W/RFLX TO VERIFICATION: HCV Ab: <0.1 s/co ratio (ref 0.0–0.9)

## 2017-02-20 LAB — HCV INTERPRETATION

## 2017-02-24 ENCOUNTER — Encounter (INDEPENDENT_AMBULATORY_CARE_PROVIDER_SITE_OTHER): Payer: Self-pay | Admitting: Family Medicine

## 2017-03-02 NOTE — Telephone Encounter (Signed)
Please help.

## 2017-03-04 ENCOUNTER — Encounter (INDEPENDENT_AMBULATORY_CARE_PROVIDER_SITE_OTHER): Payer: Self-pay | Admitting: Family Medicine

## 2017-03-04 ENCOUNTER — Ambulatory Visit (INDEPENDENT_AMBULATORY_CARE_PROVIDER_SITE_OTHER): Payer: Managed Care, Other (non HMO) | Admitting: Family Medicine

## 2017-03-04 VITALS — BP 117/73 | HR 71 | Temp 98.6°F | Resp 18 | Ht 65.0 in | Wt 294.0 lb

## 2017-03-04 DIAGNOSIS — R0602 Shortness of breath: Secondary | ICD-10-CM

## 2017-03-04 DIAGNOSIS — R5383 Other fatigue: Secondary | ICD-10-CM

## 2017-03-04 DIAGNOSIS — E876 Hypokalemia: Secondary | ICD-10-CM | POA: Diagnosis not present

## 2017-03-04 DIAGNOSIS — Z1389 Encounter for screening for other disorder: Secondary | ICD-10-CM | POA: Diagnosis not present

## 2017-03-04 DIAGNOSIS — Z9189 Other specified personal risk factors, not elsewhere classified: Secondary | ICD-10-CM | POA: Diagnosis not present

## 2017-03-04 DIAGNOSIS — Z1331 Encounter for screening for depression: Secondary | ICD-10-CM

## 2017-03-04 DIAGNOSIS — Z0289 Encounter for other administrative examinations: Secondary | ICD-10-CM

## 2017-03-04 NOTE — Progress Notes (Signed)
Office: [phone]  /  Fax: [phone]   HPI:   Chief Complaint: OBESITY  Bryan Lee (MR# [id number]) is a 56 y.o. male who presents on 03/04/2017 for obesity evaluation and treatment. Current BMI is Body mass index is 48.92 kg/m.Marland Kitchen Bryan Lee has struggled with obesity for years and has been unsuccessful in either losing weight or maintaining long term weight loss. Bryan Lee attended our information session and states he is currently in the action stage of change and ready to dedicate time achieving and maintaining a healthier weight.  Bryan Lee states his family eats meals together he thinks his family will eat healthier with  him he struggles with family and or coworkers weight loss sabotage his desired weight loss is 103 lbs he started gaining weight at age 46 his heaviest weight ever was 300 lbs. he frequently makes poor food choices he frequently eats larger portions than normal  he has binge eating behaviors   Fatigue Bryan Lee feels his energy is lower than it should be. This has worsened with weight gain and has not worsened recently. Bryan Lee admits to daytime somnolence and  admits to waking up still tired. Patient is at risk for obstructive sleep apnea. Patent has a history of symptoms of daytime fatigue and Epworth sleepiness scale. Patient generally gets 5 hours of sleep per night, and states they generally have restless. Snoring is present. Apneic episodes are present. Epworth Sleepiness Score is 10  Dyspnea on exertion Bryan Lee notes increasing shortness of breath with exercising and seems to be worsening over time with weight gain. He notes getting out of breath sooner with activity than he used to. This has not gotten worse recently. Bryan Lee denies orthopnea.  Hypokalemia Bryan Lee is on potassium currently and has been 2.7 in the past. He has no current muscle cramping.  At risk for cardiovascular disease Bryan Lee is at a higher than average risk for cardiovascular disease  due to obesity and hypokalemia. He currently denies any chest pain.  Depression Screen Bryan Lee Food and Mood (modified PHQ-9) score was  Depression screen PHQ 2/9 03/04/2017  Decreased Interest 2  Down, Depressed, Hopeless 1  PHQ - 2 Score 3  Altered sleeping 3  Tired, decreased energy 3  Change in appetite 0  Feeling bad or failure about yourself  0  Trouble concentrating 0  Moving slowly or fidgety/restless 0  Suicidal thoughts 0  PHQ-9 Score 9    ALLERGIES: Allergies  Allergen Reactions  . Lactose Intolerance (Gi) Other (See Comments)    Cramping and gas pain    MEDICATIONS: No current outpatient prescriptions on file prior to visit.   No current facility-administered medications on file prior to visit.     PAST MEDICAL HISTORY: Past Medical History:  Diagnosis Date  . Achilles rupture   . Acid reflux   . Back pain   . Carpal tunnel syndrome   . Chest pain   . Constipation   . Dizzy spells   . Joint pain   . Lactose intolerance   . Lipoma   . Numbness    legs, hands  . Obesity   . Plantar fasciitis   . Shoulder pain    rotator cuff  . SOB (shortness of breath) on exertion     PAST SURGICAL HISTORY: Past Surgical History:  Procedure Laterality Date  . ACHILLES TENDON REPAIR     left  . HERNIA REPAIR    . lump removal    . VASECTOMY      SOCIAL HISTORY:  Social History  Substance Use Topics  . Smoking status: Never Smoker  . Smokeless tobacco: Never Used  . Alcohol use Yes     Comment: social    FAMILY HISTORY: Family History  Problem Relation Age of Onset  . Coronary artery disease Father   . Heart failure Father   . Hypertension Father   . Sleep apnea Father   . Heart disease Father   . Arthritis Mother   . Obesity Mother   . Diabetes      grandmother  . Cancer Sister     GYN-type cancer  . GER disease Daughter   . GER disease Son   . Diabetes Maternal Grandmother   . Cancer Maternal Grandmother     ovarian  . Cancer  Paternal Grandmother     lung cancer, + tobacco  . Seizures Sister     ROS: Review of Systems  Constitutional: Positive for malaise/fatigue.  Respiratory: Positive for shortness of breath (on exertion).   Cardiovascular: Negative for chest pain, orthopnea and claudication.    PHYSICAL EXAM: Blood pressure 117/73, pulse 71, temperature 98.6 F (37 C), temperature source Oral, resp. rate 18, height 5' 5"  (1.651 m), weight 294 lb (133.4 kg), SpO2 93 %. Body mass index is 48.92 kg/m. Physical Exam  Constitutional: He is oriented to person, place, and time. He appears well-developed and well-nourished.  Cardiovascular: Normal rate.   Pulmonary/Chest: Effort normal.  Musculoskeletal: Normal range of motion.  Neurological: He is oriented to person, place, and time.  Skin: Skin is warm and dry.  Psychiatric: He has a normal mood and affect. His behavior is normal.  Vitals reviewed.   RECENT LABS AND TESTS: BMET    Component Value Date/Time   NA 139 11/07/2015 0901   K 2.7 (LL) 11/07/2015 0901   CL 95 (L) 11/07/2015 0901   CO2 35 (H) 11/07/2015 0901   GLUCOSE 123 (H) 11/07/2015 0901   BUN 10 11/07/2015 0901   CREATININE 0.88 11/07/2015 0901   CREATININE 0.89 09/08/2013 1631   CALCIUM 9.7 11/07/2015 0901   GFRNONAA >60 10/17/2010 1312   GFRAA  10/17/2010 1312    >60        The eGFR has been calculated using the MDRD equation. This calculation has not been validated in all clinical situations. eGFR's persistently <60 mL/min signify possible Chronic Kidney Disease.   Lab Results  Component Value Date   HGBA1C 5.8 11/07/2015   No results found for: INSULIN CBC    Component Value Date/Time   WBC 8.8 11/07/2015 0901   RBC 5.54 11/07/2015 0901   HGB 15.3 11/07/2015 0901   HCT 46.1 11/07/2015 0901   PLT 329.0 11/07/2015 0901   MCV 83.3 11/07/2015 0901   MCH 27.6 09/08/2013 1631   MCHC 33.1 11/07/2015 0901   RDW 14.0 11/07/2015 0901   LYMPHSABS 1.9 11/07/2015 0901     MONOABS 0.5 11/07/2015 0901   EOSABS 0.2 11/07/2015 0901   BASOSABS 0.0 11/07/2015 0901   Iron/TIBC/Ferritin/ %Sat No results found for: IRON, TIBC, FERRITIN, IRONPCTSAT Lipid Panel     Component Value Date/Time   CHOL 219 (H) 11/07/2015 0901   TRIG 183.0 (H) 11/07/2015 0901   HDL 34.50 (L) 11/07/2015 0901   CHOLHDL 6 11/07/2015 0901   VLDL 36.6 11/07/2015 0901   LDLCALC 148 (H) 11/07/2015 0901   Hepatic Function Panel     Component Value Date/Time   PROT 7.8 11/07/2015 0901   ALBUMIN 3.8 11/07/2015 0901  AST 27 11/07/2015 0901   ALT 31 11/07/2015 0901   ALKPHOS 54 11/07/2015 0901   BILITOT 0.5 11/07/2015 0901   BILIDIR 0.1 11/07/2015 0901   IBILI 0.3 09/08/2013 1631      Component Value Date/Time   TSH 0.96 11/07/2015 0901   TSH 0.769 09/08/2013 1631   TSH 1.23 04/05/2009 0831    ECG  shows NSR with a rate of 69 BPM INDIRECT CALORIMETER done today shows a VO2 of 220 and a REE of 1530.    ASSESSMENT AND PLAN: Other fatigue - Plan: EKG 12-Lead, Comprehensive metabolic panel, CBC with Differential/Platelet, Hemoglobin A1c, Insulin, random, Lipid Panel With LDL/HDL Ratio, VITAMIN D 25 Hydroxy (Vit-D Deficiency, Fractures), Vitamin B12, Folate, TSH, T4, free, T3  SOB (shortness of breath) on exertion  Hypokalemia  Depression screening  At risk for heart disease  Morbid obesity ([place])  PLAN:  Fatigue Bryan Lee was informed that his fatigue may be related to obesity, depression or many other causes. Labs will be ordered, and in the meanwhile Bryan Lee has agreed to work on diet, exercise and weight loss to help with fatigue. Proper sleep hygiene was discussed including the need for 7-8 hours of quality sleep each night. A sleep study was not ordered based on symptoms and Epworth score.  Dyspnea on exertion Hyder's shortness of breath appears to be obesity related and exercise induced. He has agreed to work on weight loss and gradually increase exercise to treat  his exercise induced shortness of breath. If Bryan Lee follows our instructions and loses weight without improvement of his shortness of breath, we will plan to refer to pulmonology. We will monitor this condition regularly. Bryan Lee agrees to this plan.  Hypokalemia We will check labs and Bryan Lee agrees to follow up in our office in 2 weeks.  Cardiovascular risk counselling Bryan Lee was given extended (at least 30 minutes) coronary artery disease prevention counseling today. He is 56 y.o. male and has risk factors for heart disease including obesity. We discussed intensive lifestyle modifications today with an emphasis on specific weight loss instructions and strategies. Pt was also informed of the importance of increasing exercise and decreasing saturated fats to help prevent heart disease.  Depression Screen Bryan Lee had a mildly positive depression screening. Depression is commonly associated with obesity and often results in emotional eating behaviors. We will monitor this closely and work on CBT to help improve the non-hunger eating patterns. Referral to Psychology may be required if no improvement is seen as he continues in our clinic.  Obesity Bryan Lee is currently in the action stage of change and his goal is to continue with weight loss efforts He has agreed to follow the Category 3 plan Bryan Lee has been instructed to work up to a goal of 150 minutes of combined cardio and strengthening exercise per week for weight loss and overall health benefits. We discussed the following Behavioral Modification Stratagies today: increasing lean protein intake, decreasing simple carbohydrates  and increasing vegetables  Bryan Lee has agreed to follow up with our clinic in 2 weeks. He was informed of the importance of frequent follow up visits to maximize his success with intensive lifestyle modifications for his multiple health conditions. He was informed we would discuss his lab results at his next visit  unless there is a critical issue that needs to be addressed sooner. Bryan Lee agreed to keep his next visit at the agreed upon time to discuss these results.  Corey Skains, am acting as scribe for Dennard Nip, MD  I have reviewed the above documentation for accuracy and completeness, and I agree with the above. -Dennard Nip, MD

## 2017-03-05 LAB — COMPREHENSIVE METABOLIC PANEL
ALBUMIN: 4 g/dL (ref 3.5–5.5)
ALT: 34 IU/L (ref 0–44)
AST: 27 IU/L (ref 0–40)
Albumin/Globulin Ratio: 1.1 — ABNORMAL LOW (ref 1.2–2.2)
Alkaline Phosphatase: 57 IU/L (ref 39–117)
BUN / CREAT RATIO: 12 (ref 9–20)
BUN: 10 mg/dL (ref 6–24)
Bilirubin Total: 0.4 mg/dL (ref 0.0–1.2)
CALCIUM: 9.5 mg/dL (ref 8.7–10.2)
CO2: 33 mmol/L — AB (ref 18–29)
CREATININE: 0.84 mg/dL (ref 0.76–1.27)
Chloride: 93 mmol/L — ABNORMAL LOW (ref 96–106)
GFR calc non Af Amer: 99 mL/min/{1.73_m2} (ref >59)
GFR, EST AFRICAN AMERICAN: 115 mL/min/{1.73_m2} (ref >59)
GLUCOSE: 89 mg/dL (ref 65–99)
Globulin, Total: 3.7 g/dL (ref 1.5–4.5)
Potassium: 3.5 mmol/L (ref 3.5–5.2)
Sodium: 139 mmol/L (ref 134–144)
TOTAL PROTEIN: 7.7 g/dL (ref 6.0–8.5)

## 2017-03-05 LAB — CBC WITH DIFFERENTIAL/PLATELET
Basophils Absolute: 0 10*3/uL (ref 0.0–0.2)
Basos: 0 %
EOS (ABSOLUTE): 0.2 10*3/uL (ref 0.0–0.4)
EOS: 3 %
HEMATOCRIT: 42.9 % (ref 37.5–51.0)
HEMOGLOBIN: 14.3 g/dL (ref 13.0–17.7)
Immature Grans (Abs): 0 10*3/uL (ref 0.0–0.1)
Immature Granulocytes: 0 %
LYMPHS ABS: 2.4 10*3/uL (ref 0.7–3.1)
Lymphs: 28 %
MCH: 27.6 pg (ref 26.6–33.0)
MCHC: 33.3 g/dL (ref 31.5–35.7)
MCV: 83 fL (ref 79–97)
MONOCYTES: 5 %
Monocytes Absolute: 0.4 10*3/uL (ref 0.1–0.9)
Neutrophils Absolute: 5.5 10*3/uL (ref 1.4–7.0)
Neutrophils: 64 %
Platelets: 348 10*3/uL (ref 150–379)
RBC: 5.18 x10E6/uL (ref 4.14–5.80)
RDW: 14.1 % (ref 12.3–15.4)
WBC: 8.5 10*3/uL (ref 3.4–10.8)

## 2017-03-05 LAB — LIPID PANEL WITH LDL/HDL RATIO
Cholesterol, Total: 224 mg/dL — ABNORMAL HIGH (ref 100–199)
HDL: 32 mg/dL — ABNORMAL LOW (ref >39)
LDL Calculated: 166 mg/dL — ABNORMAL HIGH (ref 0–99)
LDL/HDL RATIO: 5.2 ratio — AB (ref 0.0–3.6)
TRIGLYCERIDES: 131 mg/dL (ref 0–149)
VLDL Cholesterol Cal: 26 mg/dL (ref 5–40)

## 2017-03-05 LAB — HEMOGLOBIN A1C
ESTIMATED AVERAGE GLUCOSE: 123 mg/dL
Hgb A1c MFr Bld: 5.9 % — ABNORMAL HIGH (ref 4.8–5.6)

## 2017-03-05 LAB — VITAMIN D 25 HYDROXY (VIT D DEFICIENCY, FRACTURES): Vit D, 25-Hydroxy: 14.6 ng/mL — ABNORMAL LOW (ref 30.0–100.0)

## 2017-03-05 LAB — TSH: TSH: 0.735 u[IU]/mL (ref 0.450–4.500)

## 2017-03-05 LAB — T4, FREE: FREE T4: 1.41 ng/dL (ref 0.82–1.77)

## 2017-03-05 LAB — VITAMIN B12: Vitamin B-12: 732 pg/mL (ref 232–1245)

## 2017-03-05 LAB — INSULIN, RANDOM: INSULIN: 16.2 u[IU]/mL (ref 2.6–24.9)

## 2017-03-05 LAB — T3: T3 TOTAL: 100 ng/dL (ref 71–180)

## 2017-03-05 LAB — FOLATE: Folate: 10 ng/mL (ref >3.0)

## 2017-03-16 ENCOUNTER — Encounter (INDEPENDENT_AMBULATORY_CARE_PROVIDER_SITE_OTHER): Payer: Self-pay | Admitting: Family Medicine

## 2017-03-16 NOTE — Telephone Encounter (Signed)
Please advise 

## 2017-03-18 ENCOUNTER — Encounter (INDEPENDENT_AMBULATORY_CARE_PROVIDER_SITE_OTHER): Payer: Managed Care, Other (non HMO) | Admitting: Family Medicine

## 2017-03-18 ENCOUNTER — Ambulatory Visit (INDEPENDENT_AMBULATORY_CARE_PROVIDER_SITE_OTHER): Payer: Managed Care, Other (non HMO) | Admitting: Family Medicine

## 2017-03-18 VITALS — BP 111/72 | HR 74 | Temp 98.6°F | Ht 65.0 in | Wt 286.0 lb

## 2017-03-18 DIAGNOSIS — E559 Vitamin D deficiency, unspecified: Secondary | ICD-10-CM | POA: Insufficient documentation

## 2017-03-18 DIAGNOSIS — R7303 Prediabetes: Secondary | ICD-10-CM | POA: Diagnosis not present

## 2017-03-18 DIAGNOSIS — Z9189 Other specified personal risk factors, not elsewhere classified: Secondary | ICD-10-CM | POA: Diagnosis not present

## 2017-03-18 MED ORDER — METFORMIN HCL 500 MG PO TABS: 500.0000 mg | ORAL_TABLET | Freq: Every day | ORAL | 0 refills | Status: DC

## 2017-03-18 MED ORDER — VITAMIN D (ERGOCALCIFEROL) 1.25 MG (50000 UNIT) PO CAPS: 50000.0000 [IU] | ORAL_CAPSULE | ORAL | 0 refills | Status: DC

## 2017-03-18 MED FILL — VIT D2 1.25 MG (50,000 UNIT: 1.25 MG | 28 days supply | Qty: 4 | Fill #0

## 2017-03-18 MED FILL — metFORMIN HCL 500 MG TABS: 500 | 30 days supply | Qty: 30 | Fill #0

## 2017-03-18 NOTE — Progress Notes (Signed)
Office: [phone]  /  Fax: [phone]   HPI:   Chief Complaint: OBESITY Bryan Lee is here to discuss his progress with his obesity treatment plan. He is following his eating plan approximately 100 % of the time and states he is exercising 0 minutes 0 times per week. Bryan Lee did well with weight loss but noted significant in-between meals. He missed using sauces His weight is 286 lb (129.7 kg) today and has had a weight loss of 8 pounds over a period of 2 weeks since his last visit. He has lost 8 lbs since starting treatment with [name].  Vitamin D deficiency Bryan Lee has a diagnosis of vitamin D deficiency. He is not currently taking vit D and denies nausea, vomiting or muscle weakness.  Pre-Diabetes Bryan Lee has a diagnosis of prediabetes based on his elevated Hgb A1c at 5.8 and was informed this puts him at greater risk of developing diabetes. He is not taking metformin currently and continues to work on diet and exercise to decrease risk of diabetes. He denies nausea or hypoglycemia.  At risk for diabetes Bryan Lee is at higher than average risk for developing diabetes due to his obesity. He currently denies polyuria or polydipsia.   Wt Readings from Last 500 Encounters:  03/18/17 286 lb (129.7 kg)  03/04/17 294 lb (133.4 kg)  02/19/17 298 lb 14.4 oz (135.6 kg)  02/12/17 (!) 302 lb (137 kg)  02/01/17 (!) 303 lb (137.4 kg)  11/07/15 289 lb 6 oz (131.3 kg)  11/02/15 289 lb 8 oz (131.3 kg)  04/09/14 269 lb 4 oz (122.1 kg)  09/08/13 283 lb 3.2 oz (128.5 kg)  07/06/11 244 lb 6.4 oz (110.9 kg)  11/08/09 (!) 226 lb (102.5 kg)  04/05/09 204 lb 12.8 oz (92.9 kg)  02/20/09 206 lb 12.8 oz (93.8 kg)     ALLERGIES: Allergies  Allergen Reactions  . Lactose Intolerance (Gi) Other (See Comments)    Cramping and gas pain    MEDICATIONS: Current Outpatient Prescriptions on File Prior to Visit  Medication Sig Dispense Refill  . Naproxen Sodium (ALEVE) 220 MG CAPS Take by mouth as  needed.    . potassium chloride SA (K-DUR,KLOR-CON) 20 MEQ tablet Take 20 mEq by mouth daily.     No current facility-administered medications on file prior to visit.     PAST MEDICAL HISTORY: Past Medical History:  Diagnosis Date  . Achilles rupture   . Acid reflux   . Back pain   . Carpal tunnel syndrome   . Chest pain   . Constipation   . Dizzy spells   . Joint pain   . Lactose intolerance   . Lipoma   . Numbness    legs, hands  . Obesity   . Plantar fasciitis   . Shoulder pain    rotator cuff  . SOB (shortness of breath) on exertion     PAST SURGICAL HISTORY: Past Surgical History:  Procedure Laterality Date  . ACHILLES TENDON REPAIR     left  . HERNIA REPAIR    . lump removal    . VASECTOMY      SOCIAL HISTORY: Social History  Substance Use Topics  . Smoking status: Never Smoker  . Smokeless tobacco: Never Used  . Alcohol use Yes     Comment: social    FAMILY HISTORY: Family History  Problem Relation Age of Onset  . Coronary artery disease Father   . Heart failure Father   . Hypertension Father   .  Sleep apnea Father   . Heart disease Father   . Arthritis Mother   . Obesity Mother   . Diabetes      grandmother  . Cancer Sister     GYN-type cancer  . GER disease Daughter   . GER disease Son   . Diabetes Maternal Grandmother   . Cancer Maternal Grandmother     ovarian  . Cancer Paternal Grandmother     lung cancer, + tobacco  . Seizures Sister     ROS: Review of Systems  Constitutional: Positive for malaise/fatigue.  Gastrointestinal: Negative for nausea and vomiting.  Genitourinary: Negative for frequency.  Musculoskeletal:       Negative muscle weakness  Endo/Heme/Allergies: Negative for polydipsia.       Negative hypoglycemia    PHYSICAL EXAM: Blood pressure 111/72, pulse 74, temperature 98.6 F (37 C), temperature source Oral, height 5\' 5"  (1.651 m), weight 286 lb (129.7 kg), SpO2 92 %. Body mass index is 47.59  kg/m. Physical Exam  Constitutional: He is oriented to person, place, and time. He appears well-developed and well-nourished.  Cardiovascular: Normal rate.   Pulmonary/Chest: Effort normal.  Musculoskeletal: Normal range of motion.  Neurological: He is oriented to person, place, and time.  Skin: Skin is warm and dry.  Psychiatric: He has a normal mood and affect. His behavior is normal.  Vitals reviewed.   RECENT LABS AND TESTS: BMET    Component Value Date/Time   NA 139 03/04/2017 1145   K 3.5 03/04/2017 1145   CL 93 (L) 03/04/2017 1145   CO2 33 (H) 03/04/2017 1145   GLUCOSE 89 03/04/2017 1145   GLUCOSE 123 (H) 11/07/2015 0901   BUN 10 03/04/2017 1145   CREATININE 0.84 03/04/2017 1145   CREATININE 0.89 09/08/2013 1631   CALCIUM 9.5 03/04/2017 1145   GFRNONAA 99 03/04/2017 1145   GFRAA 115 03/04/2017 1145   Lab Results  Component Value Date   HGBA1C 5.9 (H) 03/04/2017   HGBA1C 5.8 11/07/2015   Lab Results  Component Value Date   INSULIN 16.2 03/04/2017   CBC    Component Value Date/Time   WBC 8.5 03/04/2017 1145   WBC 8.8 11/07/2015 0901   RBC 5.18 03/04/2017 1145   RBC 5.54 11/07/2015 0901   HGB 15.3 11/07/2015 0901   HCT 42.9 03/04/2017 1145   PLT 348 03/04/2017 1145   MCV 83 03/04/2017 1145   MCH 27.6 03/04/2017 1145   MCH 27.6 09/08/2013 1631   MCHC 33.3 03/04/2017 1145   MCHC 33.1 11/07/2015 0901   RDW 14.1 03/04/2017 1145   LYMPHSABS 2.4 03/04/2017 1145   MONOABS 0.5 11/07/2015 0901   EOSABS 0.2 03/04/2017 1145   BASOSABS 0.0 03/04/2017 1145   Iron/TIBC/Ferritin/ %Sat No results found for: IRON, TIBC, FERRITIN, IRONPCTSAT Lipid Panel     Component Value Date/Time   CHOL 224 (H) 03/04/2017 1145   TRIG 131 03/04/2017 1145   HDL 32 (L) 03/04/2017 1145   CHOLHDL 6 11/07/2015 0901   VLDL 36.6 11/07/2015 0901   LDLCALC 166 (H) 03/04/2017 1145   Hepatic Function Panel     Component Value Date/Time   PROT 7.7 03/04/2017 1145   ALBUMIN 4.0  03/04/2017 1145   AST 27 03/04/2017 1145   ALT 34 03/04/2017 1145   ALKPHOS 57 03/04/2017 1145   BILITOT 0.4 03/04/2017 1145   BILIDIR 0.1 11/07/2015 0901   IBILI 0.3 09/08/2013 1631      Component Value Date/Time   TSH 0.735  03/04/2017 1145   TSH 0.96 11/07/2015 0901   TSH 0.769 09/08/2013 1631    ASSESSMENT AND PLAN: Vitamin D deficiency - Plan: Vitamin D, Ergocalciferol, (DRISDOL) 50000 units CAPS capsule  Prediabetes - Plan: metFORMIN (GLUCOPHAGE) 500 MG tablet  At risk for diabetes mellitus  Morbid obesity ([place])  PLAN:  Vitamin D Deficiency Bryan Lee was informed that low vitamin D levels contributes to fatigue and are associated with obesity, breast, and colon cancer. He agrees to start to take prescription Vit D @50 ,000 IU every week #4 with no refills and will follow up for routine testing of vitamin D, at least 2-3 times per year. He was informed of the risk of over-replacement of vitamin D and agrees to not increase his dose unless he discusses this with [name] first. Bryan Lee agrees to follow up with our clinic in 2 weeks.  Pre-Diabetes Bryan Lee will continue to work on weight loss, exercise, and decreasing simple carbohydrates in his diet to help decrease the risk of diabetes. We dicussed metformin including benefits and risks. He was informed that eating too many simple carbohydrates or too many calories at one sitting increases the likelihood of GI side effects. Bryan Lee requested metformin for now and a prescription was written today for Metformin 500 mg every morning #30 with no refills. Bryan Lee agreed to follow up with [name] as directed to monitor his progress.  Diabetes risk counselling Bryan Lee was given extended (at least 15 minutes) diabetes prevention counseling today. He is 56 y.o. male and has risk factors for diabetes including obesity. We discussed intensive lifestyle modifications today with an emphasis on weight loss as well as increasing exercise and decreasing simple  carbohydrates in his diet.  Obesity Bryan Lee is currently in the action stage of change. As such, his goal is to continue with weight loss efforts He has agreed to follow the Category 3 plan Bryan Lee has been instructed to work up to a goal of 150 minutes of combined cardio and strengthening exercise per week for weight loss and overall health benefits. We discussed the following Behavioral Modification Stratagies today: increasing lean protein intake, decreasing simple carbohydrates , increasing vegetables, decrease eating out and decrease junk food  Bryan Lee has agreed to follow up with our clinic in 2 weeks. He was informed of the importance of frequent follow up visits to maximize his success with intensive lifestyle modifications for his multiple health conditions.  I, Doreene Nest, am acting as scribe for Dennard Nip, MD  I have reviewed the above documentation for accuracy and completeness, and I agree with the above. -Dennard Nip, MD

## 2017-03-19 ENCOUNTER — Encounter: Payer: Self-pay | Admitting: Family Medicine

## 2017-03-19 ENCOUNTER — Encounter (INDEPENDENT_AMBULATORY_CARE_PROVIDER_SITE_OTHER): Payer: Self-pay | Admitting: Family Medicine

## 2017-03-19 NOTE — Telephone Encounter (Signed)
Referral states that [facility] has LM for pt to call and schedule appt. Called pt and he states he never received a call from them. I gave the pt their phone number to reach out to them for scheduling.

## 2017-04-01 ENCOUNTER — Ambulatory Visit (INDEPENDENT_AMBULATORY_CARE_PROVIDER_SITE_OTHER): Payer: Managed Care, Other (non HMO) | Admitting: Family Medicine

## 2017-04-01 VITALS — BP 138/84 | HR 64 | Temp 98.7°F | Ht 65.0 in | Wt 281.0 lb

## 2017-04-01 DIAGNOSIS — R7303 Prediabetes: Secondary | ICD-10-CM | POA: Diagnosis not present

## 2017-04-01 DIAGNOSIS — E559 Vitamin D deficiency, unspecified: Secondary | ICD-10-CM | POA: Diagnosis not present

## 2017-04-01 MED ORDER — VITAMIN D (ERGOCALCIFEROL) 1.25 MG (50000 UNIT) PO CAPS: 50000.0000 [IU] | ORAL_CAPSULE | ORAL | 0 refills | Status: DC

## 2017-04-01 MED ORDER — METFORMIN HCL 500 MG PO TABS: 500.0000 mg | ORAL_TABLET | Freq: Every day | ORAL | 0 refills | Status: DC

## 2017-04-01 NOTE — Progress Notes (Signed)
Office: [phone]  /  Fax: [phone]   HPI:   Chief Complaint: OBESITY Bryan Lee is here to discuss his progress with his obesity treatment plan. He is following his eating plan approximately 100 % of the time and states he is exercising 0 minutes 0 times per week. Bryan Lee continues to do well with weight loss, he is getting bored with breakfast and would like additional options. He would like to start lifting weights. His weight is 281 lb (127.5 kg) today and has had a weight loss of 5 pounds over a period of 2 weeks since his last visit. He has lost 13 lbs since starting treatment with [name].  Pre-Diabetes Bryan Lee has a diagnosis of prediabetes based on his elevated HgA1c and was informed this puts him at greater risk of developing diabetes. He started  taking metformin and had GI upset but no longer. He continues to work on diet and exercise to decrease risk of diabetes. He denies nausea or hypoglycemia.  Vitamin D deficiency Bryan Lee has a diagnosis of vitamin D deficiency. He is currently stable on vit D and denies nausea, vomiting or muscle weakness.  Wt Readings from Last 500 Encounters:  04/01/17 281 lb (127.5 kg)  03/18/17 286 lb (129.7 kg)  03/04/17 294 lb (133.4 kg)  02/19/17 298 lb 14.4 oz (135.6 kg)  02/12/17 (!) 302 lb (137 kg)  02/01/17 (!) 303 lb (137.4 kg)  11/07/15 289 lb 6 oz (131.3 kg)  11/02/15 289 lb 8 oz (131.3 kg)  04/09/14 269 lb 4 oz (122.1 kg)  09/08/13 283 lb 3.2 oz (128.5 kg)  07/06/11 244 lb 6.4 oz (110.9 kg)  11/08/09 (!) 226 lb (102.5 kg)  04/05/09 204 lb 12.8 oz (92.9 kg)  02/20/09 206 lb 12.8 oz (93.8 kg)     ALLERGIES: Allergies  Allergen Reactions  . Lactose Intolerance (Gi) Other (See Comments)    Cramping and gas pain    MEDICATIONS: Current Outpatient Prescriptions on File Prior to Visit  Medication Sig Dispense Refill  . metFORMIN (GLUCOPHAGE) 500 MG tablet Take 1 tablet (500 mg total) by mouth daily with breakfast. 30 tablet 0    . Naproxen Sodium (ALEVE) 220 MG CAPS Take by mouth as needed.    . Vitamin D, Ergocalciferol, (DRISDOL) 50000 units CAPS capsule Take 1 capsule (50,000 Units total) by mouth every 7 (seven) days. 4 capsule 0   No current facility-administered medications on file prior to visit.     PAST MEDICAL HISTORY: Past Medical History:  Diagnosis Date  . Achilles rupture   . Acid reflux   . Back pain   . Carpal tunnel syndrome   . Chest pain   . Constipation   . Dizzy spells   . Joint pain   . Lactose intolerance   . Lipoma   . Numbness    legs, hands  . Obesity   . Plantar fasciitis   . Shoulder pain    rotator cuff  . SOB (shortness of breath) on exertion     PAST SURGICAL HISTORY: Past Surgical History:  Procedure Laterality Date  . ACHILLES TENDON REPAIR     left  . HERNIA REPAIR    . lump removal    . VASECTOMY      SOCIAL HISTORY: Social History  Substance Use Topics  . Smoking status: Never Smoker  . Smokeless tobacco: Never Used  . Alcohol use Yes     Comment: social    FAMILY HISTORY: Family History  Problem  Relation Age of Onset  . Coronary artery disease Father   . Heart failure Father   . Hypertension Father   . Sleep apnea Father   . Heart disease Father   . Arthritis Mother   . Obesity Mother   . Diabetes      grandmother  . Cancer Sister     GYN-type cancer  . GER disease Daughter   . GER disease Son   . Diabetes Maternal Grandmother   . Cancer Maternal Grandmother     ovarian  . Cancer Paternal Grandmother     lung cancer, + tobacco  . Seizures Sister     ROS: Review of Systems  Constitutional: Positive for weight loss.  Gastrointestinal: Negative for diarrhea, nausea and vomiting.  Musculoskeletal:       Negative muscle weakness  Endo/Heme/Allergies:       Negative hypoglycemia    PHYSICAL EXAM: Blood pressure 138/84, pulse 64, temperature 98.7 F (37.1 C), temperature source Oral, height 5\' 5"  (1.651 m), weight 281 lb (127.5  kg), SpO2 95 %. Body mass index is 46.76 kg/m. Physical Exam  Constitutional: He is oriented to person, place, and time. He appears well-developed and well-nourished.  Cardiovascular: Normal rate.   Pulmonary/Chest: Effort normal.  Musculoskeletal: Normal range of motion.  Neurological: He is oriented to person, place, and time.  Skin: Skin is warm and dry.  Psychiatric: He has a normal mood and affect. His behavior is normal.  Vitals reviewed.   RECENT LABS AND TESTS: BMET    Component Value Date/Time   NA 139 03/04/2017 1145   K 3.5 03/04/2017 1145   CL 93 (L) 03/04/2017 1145   CO2 33 (H) 03/04/2017 1145   GLUCOSE 89 03/04/2017 1145   GLUCOSE 123 (H) 11/07/2015 0901   BUN 10 03/04/2017 1145   CREATININE 0.84 03/04/2017 1145   CREATININE 0.89 09/08/2013 1631   CALCIUM 9.5 03/04/2017 1145   GFRNONAA 99 03/04/2017 1145   GFRAA 115 03/04/2017 1145   Lab Results  Component Value Date   HGBA1C 5.9 (H) 03/04/2017   HGBA1C 5.8 11/07/2015   Lab Results  Component Value Date   INSULIN 16.2 03/04/2017   CBC    Component Value Date/Time   WBC 8.5 03/04/2017 1145   WBC 8.8 11/07/2015 0901   RBC 5.18 03/04/2017 1145   RBC 5.54 11/07/2015 0901   HGB 15.3 11/07/2015 0901   HCT 42.9 03/04/2017 1145   PLT 348 03/04/2017 1145   MCV 83 03/04/2017 1145   MCH 27.6 03/04/2017 1145   MCH 27.6 09/08/2013 1631   MCHC 33.3 03/04/2017 1145   MCHC 33.1 11/07/2015 0901   RDW 14.1 03/04/2017 1145   LYMPHSABS 2.4 03/04/2017 1145   MONOABS 0.5 11/07/2015 0901   EOSABS 0.2 03/04/2017 1145   BASOSABS 0.0 03/04/2017 1145   Iron/TIBC/Ferritin/ %Sat No results found for: IRON, TIBC, FERRITIN, IRONPCTSAT Lipid Panel     Component Value Date/Time   CHOL 224 (H) 03/04/2017 1145   TRIG 131 03/04/2017 1145   HDL 32 (L) 03/04/2017 1145   CHOLHDL 6 11/07/2015 0901   VLDL 36.6 11/07/2015 0901   LDLCALC 166 (H) 03/04/2017 1145   Hepatic Function Panel     Component Value Date/Time    PROT 7.7 03/04/2017 1145   ALBUMIN 4.0 03/04/2017 1145   AST 27 03/04/2017 1145   ALT 34 03/04/2017 1145   ALKPHOS 57 03/04/2017 1145   BILITOT 0.4 03/04/2017 1145   BILIDIR 0.1 11/07/2015 0901  IBILI 0.3 09/08/2013 1631      Component Value Date/Time   TSH 0.735 03/04/2017 1145   TSH 0.96 11/07/2015 0901   TSH 0.769 09/08/2013 1631    ASSESSMENT AND PLAN: Prediabetes - Plan: metFORMIN (GLUCOPHAGE) 500 MG tablet  Vitamin D deficiency - Plan: Vitamin D, Ergocalciferol, (DRISDOL) 50000 units CAPS capsule  Morbid obesity ([place])  PLAN:  Pre-Diabetes Dempsey will continue to work on weight loss, exercise, and decreasing simple carbohydrates in his diet to help decrease the risk of diabetes. We dicussed metformin including benefits and risks. He was informed that eating too many simple carbohydrates or too many calories at one sitting increases the likelihood of GI side effects. Dalonte agrees to continue metformin for now and a prescription was written today for 1 month refill. Rishard agreed to follow up with [name] as directed to monitor his progress.  Vitamin D Deficiency Keyon was informed that low vitamin D levels contributes to fatigue and are associated with obesity, breast, and colon cancer. He agrees to continue to take prescription Vit D @50 ,000 IU every week, we will refill for 1 month and will follow up for routine testing of vitamin D, at least 2-3 times per year. He was informed of the risk of over-replacement of vitamin D and agrees to not increase his dose unless he discusses this with [name] first. Altair agrees to follow up with our clinic in 2 to 3 weeks.  Obesity Jiyan is currently in the action stage of change. As such, his goal is to continue with weight loss efforts He has agreed to keep a food journal with 300 to 450 calories and 30+ grams of protein daily and follow the Category 3 plan Tywone has been instructed to work up to a goal of 150 minutes of combined  cardio and strengthening exercise per week for weight loss and overall health benefits. We discussed the following Behavioral Modification Stratagies today: increasing lean protein intake and work on meal planning and easy cooking plans  Chael has agreed to follow up with our clinic in 2 to 3 weeks. He was informed of the importance of frequent follow up visits to maximize his success with intensive lifestyle modifications for his multiple health conditions.  I, Doreene Nest, am acting as scribe for Dennard Nip, MD  I have reviewed the above documentation for accuracy and completeness, and I agree with the above. -Dennard Nip, MD

## 2017-04-06 ENCOUNTER — Encounter (INDEPENDENT_AMBULATORY_CARE_PROVIDER_SITE_OTHER): Payer: Self-pay | Admitting: Family Medicine

## 2017-04-08 ENCOUNTER — Ambulatory Visit (INDEPENDENT_AMBULATORY_CARE_PROVIDER_SITE_OTHER): Payer: Managed Care, Other (non HMO) | Admitting: Physician Assistant

## 2017-04-08 ENCOUNTER — Encounter: Payer: Self-pay | Admitting: Gastroenterology

## 2017-04-08 VITALS — BP 114/73 | HR 75 | Temp 98.9°F | Resp 16 | Ht 65.0 in | Wt 280.8 lb

## 2017-04-08 DIAGNOSIS — Q309 Congenital malformation of nose, unspecified: Secondary | ICD-10-CM

## 2017-04-08 MED ORDER — DOXYCYCLINE HYCLATE 100 MG PO CAPS: 100.0000 mg | ORAL_CAPSULE | Freq: Two times a day (BID) | ORAL | 0 refills | Status: AC

## 2017-04-08 NOTE — Patient Instructions (Signed)
     IF you received an x-ray today, you will receive an invoice from [facility]. Please contact [facility] at [phone] with questions or concerns regarding your invoice.   IF you received labwork today, you will receive an invoice from [facility]. Please contact [facility] at [phone] with questions or concerns regarding your invoice.   Our billing staff will not be able to assist you with questions regarding bills from these companies.  You will be contacted with the lab results as soon as they are available. The fastest way to get your results is to activate your My Chart account. Instructions are located on the last page of this paperwork. If you have not heard from us regarding the results in 2 weeks, please contact this office.     

## 2017-04-08 NOTE — Progress Notes (Signed)
04/08/2017 10:40 AM   DOB: July 05, 1961 / MRN: [id number]  SUBJECTIVE:  Bryan Lee is a 56 y.o. male presenting for nasal congestion and nasal pain (about the septum) that started 5 days ago. Says it hurts to touch the bridge of the nose. Tells me he also has maxillary tenderness and pressure. Denies any palpable masses. He has sneezing right now but tells me this is normal for him. He has not tried any medication.   He is allergic to lactose intolerance (gi).   He  has a past medical history of Achilles rupture; Acid reflux; Back pain; Carpal tunnel syndrome; Chest pain; Constipation; Dizzy spells; Joint pain; Lactose intolerance; Lipoma; Numbness; Obesity; Plantar fasciitis; Shoulder pain; and SOB (shortness of breath) on exertion.    He  reports that he has never smoked. He has never used smokeless tobacco. He reports that he drinks alcohol. He reports that he does not use drugs. He  reports that he currently engages in sexual activity and has had male partners. He reports using the following method of birth control/protection: Surgical. The patient  has a past surgical history that includes Hernia repair; Achilles tendon repair; Vasectomy; and lump removal.  His family history includes Arthritis in his mother; Cancer in his maternal grandmother, paternal grandmother, and sister; Coronary artery disease in his father; Diabetes in his maternal grandmother; GER disease in his daughter and son; Heart disease in his father; Heart failure in his father; Hypertension in his father; Obesity in his mother; Seizures in his sister; Sleep apnea in his father.  Review of Systems  Constitutional: Negative for chills, diaphoresis and fever.  HENT: Negative for sore throat.   Cardiovascular: Negative for chest pain, orthopnea and leg swelling.  Gastrointestinal: Negative for nausea.  Skin: Negative for rash.  Neurological: Negative for dizziness.    The problem list and medications were reviewed and  updated by myself where necessary and exist elsewhere in the encounter.   OBJECTIVE:  BP 114/73   Pulse 75   Temp 98.9 F (37.2 C) (Oral)   Resp 16   Ht 5\' 5"  (1.651 m)   Wt 280 lb 12.8 oz (127.4 kg)   SpO2 95%   BMI 46.73 kg/m   Physical Exam  Constitutional: He appears well-developed. He is active and cooperative.  Non-toxic appearance.  HENT:  Head:    Right Ear: Hearing, tympanic membrane, external ear and ear canal normal.  Left Ear: Hearing, tympanic membrane, external ear and ear canal normal.  Nose: No mucosal edema, rhinorrhea, nose lacerations, sinus tenderness, nasal deformity, septal deviation or nasal septal hematoma. No epistaxis.  No foreign bodies. Right sinus exhibits maxillary sinus tenderness. Right sinus exhibits no frontal sinus tenderness. Left sinus exhibits no maxillary sinus tenderness and no frontal sinus tenderness.  Mouth/Throat: Uvula is midline, oropharynx is clear and moist and mucous membranes are normal. No oropharyngeal exudate, posterior oropharyngeal edema or tonsillar abscesses.  Eyes: Conjunctivae are normal. Pupils are equal, round, and reactive to light.  Cardiovascular: Normal rate.   Pulmonary/Chest: Effort normal. No tachypnea.  Lymphadenopathy:       Head (right side): No submandibular and no tonsillar adenopathy present.       Head (left side): No submandibular and no tonsillar adenopathy present.    He has no cervical adenopathy.  Neurological: He is alert.  Skin: Skin is warm and dry. He is not diaphoretic. No pallor.  Vitals reviewed.   No results found for this or any previous visit (from  the past 72 hour(s)).  No results found.  ASSESSMENT AND PLAN:  [place] was seen today for nose.  Diagnoses and all orders for this visit:  Abnormal nasal septum:  Comments: He has tendernes but no other sympomts.  This may be an early abscess.  Will try doxy.  If this fails will order a CT outpaitient to get more inromation.   Orders: -     doxycycline (VIBRAMYCIN) 100 MG capsule; Take 1 capsule (100 mg total) by mouth 2 (two) times daily.    The patient is advised to call or return to clinic if he does not see an improvement in symptoms, or to seek the care of the closest emergency department if he worsens with the above plan.   Philis Fendt, MHS, PA-C [facility] 04/08/2017 10:40 AM

## 2017-04-15 ENCOUNTER — Other Ambulatory Visit (INDEPENDENT_AMBULATORY_CARE_PROVIDER_SITE_OTHER): Payer: Self-pay | Admitting: Family Medicine

## 2017-04-15 DIAGNOSIS — R7303 Prediabetes: Secondary | ICD-10-CM

## 2017-04-15 DIAGNOSIS — E559 Vitamin D deficiency, unspecified: Secondary | ICD-10-CM

## 2017-04-15 MED FILL — metFORMIN HCL 500 MG TABS: 500 | 30 days supply | Qty: 30 | Fill #0

## 2017-04-15 MED FILL — VIT D2 1.25 MG (50,000 UNIT: 1.25 MG | 28 days supply | Qty: 4 | Fill #0

## 2017-04-19 ENCOUNTER — Ambulatory Visit (INDEPENDENT_AMBULATORY_CARE_PROVIDER_SITE_OTHER): Payer: Managed Care, Other (non HMO) | Admitting: Family Medicine

## 2017-04-20 ENCOUNTER — Ambulatory Visit (INDEPENDENT_AMBULATORY_CARE_PROVIDER_SITE_OTHER): Payer: Managed Care, Other (non HMO) | Admitting: Family Medicine

## 2017-04-20 VITALS — BP 102/67 | HR 67 | Temp 98.8°F | Ht 65.0 in | Wt 272.0 lb

## 2017-04-20 DIAGNOSIS — E559 Vitamin D deficiency, unspecified: Secondary | ICD-10-CM | POA: Diagnosis not present

## 2017-04-20 DIAGNOSIS — R7303 Prediabetes: Secondary | ICD-10-CM | POA: Diagnosis not present

## 2017-04-20 DIAGNOSIS — Z9189 Other specified personal risk factors, not elsewhere classified: Secondary | ICD-10-CM

## 2017-04-20 MED ORDER — METFORMIN HCL 500 MG PO TABS: 500.0000 mg | ORAL_TABLET | Freq: Every day | ORAL | 0 refills | Status: DC

## 2017-04-20 MED ORDER — VITAMIN D (ERGOCALCIFEROL) 1.25 MG (50000 UNIT) PO CAPS: 50000.0000 [IU] | ORAL_CAPSULE | ORAL | 0 refills | Status: DC

## 2017-04-20 NOTE — Progress Notes (Signed)
Office: [phone]  /  Fax: [phone]   HPI:   Chief Complaint: OBESITY Bryan Lee is here to discuss his progress with his obesity treatment plan. He is following his eating plan approximately 100 % of the time and states he is exercising 0 minutes 0 times per week. Bryan Lee continues to do well with weight loss. He did struggle to follow his plan at conferences. He tends to eat out on the weekends. His weight is 272 lb (123.4 kg) today and has had a weight loss of 9 pounds over a period of 3 weeks since his last visit. He has lost 22 lbs since starting treatment with [name].  Vitamin D deficiency Bryan Lee has a diagnosis of vitamin D deficiency. He is currently stable on vit D, fatigue is improved and denies nausea, vomiting or muscle weakness.  Pre-Diabetes Bryan Lee has a diagnosis of prediabetes based on his elevated Hgb A1c and was informed this puts him at greater risk of developing diabetes. He is doing well with diet and metformin. He had nausea in the beginning but not any longer. He continues to work on diet and exercise to decrease risk of diabetes. He denies hypoglycemia.  At risk for diabetes Bryan Lee is at higher than average risk for developing diabetes due to his obesity and pre-diabetes. He currently denies polyuria or polydipsia.   Wt Readings from Last 500 Encounters:  04/20/17 272 lb (123.4 kg)  04/08/17 280 lb 12.8 oz (127.4 kg)  04/01/17 281 lb (127.5 kg)  03/18/17 286 lb (129.7 kg)  03/04/17 294 lb (133.4 kg)  02/19/17 298 lb 14.4 oz (135.6 kg)  02/12/17 (!) 302 lb (137 kg)  02/01/17 (!) 303 lb (137.4 kg)  11/07/15 289 lb 6 oz (131.3 kg)  11/02/15 289 lb 8 oz (131.3 kg)  04/09/14 269 lb 4 oz (122.1 kg)  09/08/13 283 lb 3.2 oz (128.5 kg)  07/06/11 244 lb 6.4 oz (110.9 kg)  11/08/09 (!) 226 lb (102.5 kg)  04/05/09 204 lb 12.8 oz (92.9 kg)  02/20/09 206 lb 12.8 oz (93.8 kg)     ALLERGIES: Allergies  Allergen Reactions  . Lactose Intolerance (Gi) Other (See  Comments)    Cramping and gas pain    MEDICATIONS: Current Outpatient Prescriptions on File Prior to Visit  Medication Sig Dispense Refill  . Naproxen Sodium (ALEVE) 220 MG CAPS Take by mouth as needed.     No current facility-administered medications on file prior to visit.     PAST MEDICAL HISTORY: Past Medical History:  Diagnosis Date  . Achilles rupture   . Acid reflux   . Back pain   . Carpal tunnel syndrome   . Chest pain   . Constipation   . Dizzy spells   . Joint pain   . Lactose intolerance   . Lipoma   . Numbness    legs, hands  . Obesity   . Plantar fasciitis   . Shoulder pain    rotator cuff  . SOB (shortness of breath) on exertion     PAST SURGICAL HISTORY: Past Surgical History:  Procedure Laterality Date  . ACHILLES TENDON REPAIR     left  . HERNIA REPAIR    . lump removal    . VASECTOMY      SOCIAL HISTORY: Social History  Substance Use Topics  . Smoking status: Never Smoker  . Smokeless tobacco: Never Used  . Alcohol use Yes     Comment: social    FAMILY HISTORY: Family History  Problem Relation Age of Onset  . Coronary artery disease Father   . Heart failure Father   . Hypertension Father   . Sleep apnea Father   . Heart disease Father   . Arthritis Mother   . Obesity Mother   . Diabetes      grandmother  . Cancer Sister     GYN-type cancer  . GER disease Daughter   . GER disease Son   . Diabetes Maternal Grandmother   . Cancer Maternal Grandmother     ovarian  . Cancer Paternal Grandmother     lung cancer, + tobacco  . Seizures Sister     ROS: Review of Systems  Constitutional: Positive for malaise/fatigue and weight loss.  Gastrointestinal: Negative for nausea and vomiting.  Genitourinary: Negative for frequency.  Musculoskeletal:       Negative muscle weakness  Endo/Heme/Allergies: Negative for polydipsia.       Negative hypoglycemia    PHYSICAL EXAM: Blood pressure 102/67, pulse 67, temperature 98.8 F  (37.1 C), temperature source Oral, height 5\' 5"  (1.651 m), weight 272 lb (123.4 kg), SpO2 96 %. Body mass index is 45.26 kg/m. Physical Exam  Constitutional: He is oriented to person, place, and time. He appears well-developed and well-nourished.  Cardiovascular: Normal rate.   Pulmonary/Chest: Effort normal.  Musculoskeletal: Normal range of motion.  Neurological: He is oriented to person, place, and time.  Skin: Skin is warm and dry.  Psychiatric: He has a normal mood and affect. His behavior is normal.  Vitals reviewed.   RECENT LABS AND TESTS: BMET    Component Value Date/Time   NA 139 03/04/2017 1145   K 3.5 03/04/2017 1145   CL 93 (L) 03/04/2017 1145   CO2 33 (H) 03/04/2017 1145   GLUCOSE 89 03/04/2017 1145   GLUCOSE 123 (H) 11/07/2015 0901   BUN 10 03/04/2017 1145   CREATININE 0.84 03/04/2017 1145   CREATININE 0.89 09/08/2013 1631   CALCIUM 9.5 03/04/2017 1145   GFRNONAA 99 03/04/2017 1145   GFRAA 115 03/04/2017 1145   Lab Results  Component Value Date   HGBA1C 5.9 (H) 03/04/2017   HGBA1C 5.8 11/07/2015   Lab Results  Component Value Date   INSULIN 16.2 03/04/2017   CBC    Component Value Date/Time   WBC 8.5 03/04/2017 1145   WBC 8.8 11/07/2015 0901   RBC 5.18 03/04/2017 1145   RBC 5.54 11/07/2015 0901   HGB 15.3 11/07/2015 0901   HCT 42.9 03/04/2017 1145   PLT 348 03/04/2017 1145   MCV 83 03/04/2017 1145   MCH 27.6 03/04/2017 1145   MCH 27.6 09/08/2013 1631   MCHC 33.3 03/04/2017 1145   MCHC 33.1 11/07/2015 0901   RDW 14.1 03/04/2017 1145   LYMPHSABS 2.4 03/04/2017 1145   MONOABS 0.5 11/07/2015 0901   EOSABS 0.2 03/04/2017 1145   BASOSABS 0.0 03/04/2017 1145   Iron/TIBC/Ferritin/ %Sat No results found for: IRON, TIBC, FERRITIN, IRONPCTSAT Lipid Panel     Component Value Date/Time   CHOL 224 (H) 03/04/2017 1145   TRIG 131 03/04/2017 1145   HDL 32 (L) 03/04/2017 1145   CHOLHDL 6 11/07/2015 0901   VLDL 36.6 11/07/2015 0901   LDLCALC 166 (H)  03/04/2017 1145   Hepatic Function Panel     Component Value Date/Time   PROT 7.7 03/04/2017 1145   ALBUMIN 4.0 03/04/2017 1145   AST 27 03/04/2017 1145   ALT 34 03/04/2017 1145   ALKPHOS 57 03/04/2017 1145  BILITOT 0.4 03/04/2017 1145   BILIDIR 0.1 11/07/2015 0901   IBILI 0.3 09/08/2013 1631      Component Value Date/Time   TSH 0.735 03/04/2017 1145   TSH 0.96 11/07/2015 0901   TSH 0.769 09/08/2013 1631    ASSESSMENT AND PLAN: Prediabetes - Plan: metFORMIN (GLUCOPHAGE) 500 MG tablet  Vitamin D deficiency - Plan: Vitamin D, Ergocalciferol, (DRISDOL) 50000 units CAPS capsule  At risk for diabetes mellitus  Morbid obesity ([place])  PLAN:  Vitamin D Deficiency Erland was informed that low vitamin D levels contributes to fatigue and are associated with obesity, breast, and colon cancer. He agrees to continue to take prescription Vit D @50 ,000 IU every week, we will refill for 1 month and will follow up for routine testing of vitamin D, at least 2-3 times per year. He was informed of the risk of over-replacement of vitamin D and agrees to not increase his dose unless he discusses this with [name] first. Dam agrees to follow up with our clinic in 2 weeks.  Pre-Diabetes Isai will continue to work on weight loss, exercise, and decreasing simple carbohydrates in his diet to help decrease the risk of diabetes. We dicussed metformin including benefits and risks. He was informed that eating too many simple carbohydrates or too many calories at one sitting increases the likelihood of GI side effects. [place] requested metformin for now and a prescription was written today for 1 month refill. Yandel agreed to follow up with [name] as directed to monitor his progress.  Diabetes risk counselling Armanie was given extended (at least 15 minutes) diabetes prevention counseling today. He is 56 y.o. male and has risk factors for diabetes including obesity and pre-diabetes. We discussed intensive  lifestyle modifications today with an emphasis on weight loss as well as increasing exercise and decreasing simple carbohydrates in his diet.  Obesity Kolsen is currently in the action stage of change. As such, his goal is to continue with weight loss efforts He has agreed to keep a food journal with 300 to 450 calories and 35+ grams of protein at breakfast daily and follow the Category 3 plan [place] has been instructed to work up to a goal of 150 minutes of combined cardio and strengthening exercise per week for weight loss and overall health benefits. We discussed the following Behavioral Modification Stratagies today: increasing lean protein intake, decreasing simple carbohydrates  and increasing vegetables  Muaaz has agreed to follow up with our clinic in 2 to 3 weeks. He was informed of the importance of frequent follow up visits to maximize his success with intensive lifestyle modifications for his multiple health conditions.  I, Doreene Nest, am acting as scribe for Dennard Nip, MD  I have reviewed the above documentation for accuracy and completeness, and I agree with the above. -Dennard Nip, MD

## 2017-05-06 ENCOUNTER — Encounter (INDEPENDENT_AMBULATORY_CARE_PROVIDER_SITE_OTHER): Payer: Self-pay | Admitting: Family Medicine

## 2017-05-06 NOTE — Telephone Encounter (Signed)
For you -

## 2017-05-11 ENCOUNTER — Ambulatory Visit (INDEPENDENT_AMBULATORY_CARE_PROVIDER_SITE_OTHER): Payer: Managed Care, Other (non HMO) | Admitting: Family Medicine

## 2017-05-11 VITALS — BP 116/71 | HR 70 | Temp 99.0°F | Ht 65.0 in | Wt 264.0 lb

## 2017-05-11 DIAGNOSIS — E559 Vitamin D deficiency, unspecified: Secondary | ICD-10-CM | POA: Diagnosis not present

## 2017-05-11 DIAGNOSIS — Z9189 Other specified personal risk factors, not elsewhere classified: Secondary | ICD-10-CM

## 2017-05-11 DIAGNOSIS — R7303 Prediabetes: Secondary | ICD-10-CM

## 2017-05-11 MED ORDER — METFORMIN HCL 500 MG PO TABS: 500.0000 mg | ORAL_TABLET | Freq: Every day | ORAL | 0 refills | Status: DC

## 2017-05-11 MED ORDER — VITAMIN D (ERGOCALCIFEROL) 1.25 MG (50000 UNIT) PO CAPS: 50000.0000 [IU] | ORAL_CAPSULE | ORAL | 0 refills | Status: DC

## 2017-05-11 NOTE — Progress Notes (Signed)
Office: [phone]  /  Fax: [phone]   HPI:   Chief Complaint: OBESITY Bryan Lee is here to discuss his progress with his obesity treatment plan. He is on the  keep a food journal with 300 to 450 calories and 35+ grams of protein  and follow the Category 3 plan and is following his eating plan approximately 100 % of the time. He states he is exercising 0 minutes 0 times per week. Bryan Lee continues to lose weight, mostly following the category 3 plan and has started journaling for breakfast sometimes. His weight is 264 lb (119.7 kg) today and has had a weight loss of 8 pounds over a period of 3 weeks since his last visit. He has lost 30 lbs since starting treatment with [name].  Vitamin D deficiency Bryan Lee has a diagnosis of vitamin D deficiency. He is currently stable on vit D and denies nausea, vomiting or muscle weakness.  Pre-Diabetes Bryan Lee has a diagnosis of pre-diabetes based on his elevated Hgb A1c and was informed this puts him at greater risk of developing diabetes. He is tolerating metformin well and he continues to work on diet and exercise to decrease risk of diabetes. He denies nausea, vomiting or hypoglycemia.  At risk for diabetes Bryan Lee is at higher than average risk for developing diabetes due to his obesity and pre-diabetes. He currently denies polyuria or polydipsia.   ALLERGIES: Allergies  Allergen Reactions  . Lactose Intolerance (Gi) Other (See Comments)    Cramping and gas pain    MEDICATIONS: Current Outpatient Prescriptions on File Prior to Visit  Medication Sig Dispense Refill  . Naproxen Sodium (ALEVE) 220 MG CAPS Take by mouth as needed.     No current facility-administered medications on file prior to visit.     PAST MEDICAL HISTORY: Past Medical History:  Diagnosis Date  . Achilles rupture   . Acid reflux   . Back pain   . Carpal tunnel syndrome   . Chest pain   . Constipation   . Dizzy spells   . Joint pain   . Lactose intolerance     . Lipoma   . Numbness    legs, hands  . Obesity   . Plantar fasciitis   . Shoulder pain    rotator cuff  . SOB (shortness of breath) on exertion     PAST SURGICAL HISTORY: Past Surgical History:  Procedure Laterality Date  . ACHILLES TENDON REPAIR     left  . HERNIA REPAIR    . lump removal    . VASECTOMY      SOCIAL HISTORY: Social History  Substance Use Topics  . Smoking status: Never Smoker  . Smokeless tobacco: Never Used  . Alcohol use Yes     Comment: social    FAMILY HISTORY: Family History  Problem Relation Age of Onset  . Coronary artery disease Father   . Heart failure Father   . Hypertension Father   . Sleep apnea Father   . Heart disease Father   . Arthritis Mother   . Obesity Mother   . Diabetes Unknown        grandmother  . Cancer Sister        GYN-type cancer  . GER disease Daughter   . GER disease Son   . Diabetes Maternal Grandmother   . Cancer Maternal Grandmother        ovarian  . Cancer Paternal Grandmother        lung cancer, + tobacco  .  Seizures Sister     ROS: Review of Systems  Gastrointestinal: Negative for nausea and vomiting.  Genitourinary: Negative for frequency.  Musculoskeletal:       Negative muscle weakness   Endo/Heme/Allergies: Negative for polydipsia.       Negative hypoglycemia    PHYSICAL EXAM: Blood pressure 116/71, pulse 70, temperature 99 F (37.2 C), temperature source Oral, height 5\' 5"  (1.651 m), weight 264 lb (119.7 kg), SpO2 96 %. Body mass index is 43.93 kg/m. Physical Exam  RECENT LABS AND TESTS: BMET    Component Value Date/Time   NA 139 03/04/2017 1145   K 3.5 03/04/2017 1145   CL 93 (L) 03/04/2017 1145   CO2 33 (H) 03/04/2017 1145   GLUCOSE 89 03/04/2017 1145   GLUCOSE 123 (H) 11/07/2015 0901   BUN 10 03/04/2017 1145   CREATININE 0.84 03/04/2017 1145   CREATININE 0.89 09/08/2013 1631   CALCIUM 9.5 03/04/2017 1145   GFRNONAA 99 03/04/2017 1145   GFRAA 115 03/04/2017 1145   Lab  Results  Component Value Date   HGBA1C 5.9 (H) 03/04/2017   HGBA1C 5.8 11/07/2015   Lab Results  Component Value Date   INSULIN 16.2 03/04/2017   CBC    Component Value Date/Time   WBC 8.5 03/04/2017 1145   WBC 8.8 11/07/2015 0901   RBC 5.18 03/04/2017 1145   RBC 5.54 11/07/2015 0901   HGB 15.3 11/07/2015 0901   HCT 42.9 03/04/2017 1145   PLT 348 03/04/2017 1145   MCV 83 03/04/2017 1145   MCH 27.6 03/04/2017 1145   MCH 27.6 09/08/2013 1631   MCHC 33.3 03/04/2017 1145   MCHC 33.1 11/07/2015 0901   RDW 14.1 03/04/2017 1145   LYMPHSABS 2.4 03/04/2017 1145   MONOABS 0.5 11/07/2015 0901   EOSABS 0.2 03/04/2017 1145   BASOSABS 0.0 03/04/2017 1145   Iron/TIBC/Ferritin/ %Sat No results found for: IRON, TIBC, FERRITIN, IRONPCTSAT Lipid Panel     Component Value Date/Time   CHOL 224 (H) 03/04/2017 1145   TRIG 131 03/04/2017 1145   HDL 32 (L) 03/04/2017 1145   CHOLHDL 6 11/07/2015 0901   VLDL 36.6 11/07/2015 0901   LDLCALC 166 (H) 03/04/2017 1145   Hepatic Function Panel     Component Value Date/Time   PROT 7.7 03/04/2017 1145   ALBUMIN 4.0 03/04/2017 1145   AST 27 03/04/2017 1145   ALT 34 03/04/2017 1145   ALKPHOS 57 03/04/2017 1145   BILITOT 0.4 03/04/2017 1145   BILIDIR 0.1 11/07/2015 0901   IBILI 0.3 09/08/2013 1631      Component Value Date/Time   TSH 0.735 03/04/2017 1145   TSH 0.96 11/07/2015 0901   TSH 0.769 09/08/2013 1631    ASSESSMENT AND PLAN: Prediabetes - Plan: metFORMIN (GLUCOPHAGE) 500 MG tablet  Vitamin D deficiency - Plan: Vitamin D, Ergocalciferol, (DRISDOL) 50000 units CAPS capsule  At risk for diabetes mellitus  Morbid obesity ([place]) - BMI 44  PLAN:  Vitamin D Deficiency Bryan Lee was informed that low vitamin D levels contributes to fatigue and are associated with obesity, breast, and colon cancer. He agrees to continue to take prescription Vit D @50 ,000 IU every week, we will refill for 1 month and will follow up for routine testing of  vitamin D, at least 2-3 times per year. He was informed of the risk of over-replacement of vitamin D and agrees to not increase his dose unless he discusses this with [name] first. Bryan Lee agrees to follow up with our clinic in  3 weeks.  Pre-Diabetes Bryan Lee will continue to work on weight loss, exercise, and decreasing simple carbohydrates in his diet to help decrease the risk of diabetes. We dicussed metformin including benefits and risks. He was informed that eating too many simple carbohydrates or too many calories at one sitting increases the likelihood of GI side effects. Bryan Lee agrees to continue to take metformin for now and a prescription was written today for 1 month refill. Bryan Lee agreed to follow up with [name] as directed to monitor his progress.  Diabetes risk counselling Bryan Lee was given extended (at least 15 minutes) diabetes prevention counseling today. He is 56 y.o. male and has risk factors for diabetes including obesity and pre-diabetes. We discussed intensive lifestyle modifications today with an emphasis on weight loss as well as increasing exercise and decreasing simple carbohydrates in his diet.  Obesity Bryan Lee is currently in the action stage of change. As such, his goal is to continue with weight loss efforts He has agreed to keep a food journal with 300 to 450 calories and 35 grams of protein at breakfast daily and follow the Category 3 plan Bryan Lee has been instructed to work up to a goal of 150 minutes of combined cardio and strengthening exercise per week or start strengthening exercise 2 times per week for 30 to 60 minutes for weight loss and overall health benefits. We discussed the following Behavioral Modification Stratagies today: increasing lean protein intake, decreasing simple carbohydrates , work on meal planning and easy cooking plans and dealing with family or coworker sabotage  Bryan Lee has agreed to follow up with our clinic in 3 weeks. He was informed of the  importance of frequent follow up visits to maximize his success with intensive lifestyle modifications for his multiple health conditions.  I, Bryan Lee, am acting as scribe for Dennard Nip, MD  I have reviewed the above documentation for accuracy and completeness, and I agree with the above. -Dennard Nip, MD

## 2017-05-20 ENCOUNTER — Encounter: Payer: Managed Care, Other (non HMO) | Admitting: Family Medicine

## 2017-05-28 ENCOUNTER — Ambulatory Visit (AMBULATORY_SURGERY_CENTER): Payer: Self-pay

## 2017-05-28 VITALS — Ht 66.0 in | Wt 269.0 lb

## 2017-05-28 DIAGNOSIS — Z1211 Encounter for screening for malignant neoplasm of colon: Secondary | ICD-10-CM

## 2017-05-28 MED ORDER — SUPREP BOWEL PREP KIT 17.5-3.13-1.6 GM/177ML PO SOLN: 1.0000 | Freq: Once | ORAL | 0 refills | Status: AC

## 2017-05-28 NOTE — Progress Notes (Signed)
No allergies to eggs or soy No diet meds No home oxygen No problems with anesthesia  Registered emmi

## 2017-06-01 ENCOUNTER — Ambulatory Visit (INDEPENDENT_AMBULATORY_CARE_PROVIDER_SITE_OTHER): Payer: Managed Care, Other (non HMO) | Admitting: Family Medicine

## 2017-06-01 VITALS — BP 107/70 | HR 66 | Temp 98.5°F | Ht 66.0 in | Wt 258.0 lb

## 2017-06-01 DIAGNOSIS — R7303 Prediabetes: Secondary | ICD-10-CM

## 2017-06-01 DIAGNOSIS — E559 Vitamin D deficiency, unspecified: Secondary | ICD-10-CM

## 2017-06-01 DIAGNOSIS — Z9189 Other specified personal risk factors, not elsewhere classified: Secondary | ICD-10-CM | POA: Diagnosis not present

## 2017-06-01 MED ORDER — VITAMIN D (ERGOCALCIFEROL) 1.25 MG (50000 UNIT) PO CAPS: 50000.0000 [IU] | ORAL_CAPSULE | ORAL | 0 refills | Status: DC

## 2017-06-01 NOTE — Progress Notes (Signed)
Office: [phone]  /  Fax: [phone]   HPI:   Chief Complaint: OBESITY Bryan Lee is here to discuss his progress with his obesity treatment plan. He is on the  keep a food journal with 300 to 450 calories and 35 grams of protein  and follow the Category 3 plan and is following his eating plan approximately 100 % of the time. He states he is exercising 0 minutes 0 times per week. Bryan Lee continues to do well with weight loss. He has not been exercising due to recent fall and knee pain. He would like more variety for other meals. His weight is 258 lb (117 kg) today and has had a weight loss of 6 pounds over a period of 3 weeks since his last visit. He has lost 36 lbs since starting treatment with [name].  Vitamin D deficiency Bryan Lee has a diagnosis of vitamin D deficiency. He is currently taking vit D and denies nausea, vomiting or muscle weakness.  Pre-Diabetes Bryan Lee has a diagnosis of pre-diabetes based on his elevated Hgb A1c and was informed this puts him at greater risk of developing diabetes. He is taking metformin currently and continues to work on diet and exercise to decrease risk of diabetes. He denies nausea, polyphagia or hypoglycemia.  At risk for diabetes Bryan Lee is at higher than average risk for developing diabetes due to his obesity and pre-diabetes. He currently denies polyuria or polydipsia.   ALLERGIES: Allergies  Allergen Reactions  . Lactose Intolerance (Gi) Other (See Comments)    Cramping and gas pain    MEDICATIONS: Current Outpatient Prescriptions on File Prior to Visit  Medication Sig Dispense Refill  . metFORMIN (GLUCOPHAGE) 500 MG tablet Take 1 tablet (500 mg total) by mouth daily with breakfast. 30 tablet 0  . Naproxen Sodium (ALEVE) 220 MG CAPS Take by mouth as needed.     No current facility-administered medications on file prior to visit.     PAST MEDICAL HISTORY: Past Medical History:  Diagnosis Date  . Achilles rupture   . Acid reflux     . Back pain   . Carpal tunnel syndrome   . Chest pain   . Constipation   . Dizzy spells   . Joint pain   . Lactose intolerance   . Lipoma   . Numbness    legs, hands  . Obesity   . Plantar fasciitis   . Shoulder pain    rotator cuff  . SOB (shortness of breath) on exertion     PAST SURGICAL HISTORY: Past Surgical History:  Procedure Laterality Date  . ACHILLES TENDON REPAIR     left  . HERNIA REPAIR    . lump removal    . VASECTOMY      SOCIAL HISTORY: Social History  Substance Use Topics  . Smoking status: Never Smoker  . Smokeless tobacco: Never Used  . Alcohol use Yes     Comment: social    FAMILY HISTORY: Family History  Problem Relation Age of Onset  . Coronary artery disease Father   . Heart failure Father   . Hypertension Father   . Sleep apnea Father   . Heart disease Father   . Arthritis Mother   . Obesity Mother   . Diabetes Unknown        grandmother  . Cancer Sister        GYN-type cancer  . GER disease Daughter   . GER disease Son   . Diabetes Maternal Grandmother   .  Cancer Maternal Grandmother        ovarian  . Cancer Paternal Grandmother        lung cancer, + tobacco and colon  . Seizures Sister     ROS: Review of Systems  Constitutional: Positive for weight loss.  Gastrointestinal: Negative for nausea and vomiting.  Genitourinary: Negative for frequency.  Musculoskeletal:       Negative muscle weakness  Endo/Heme/Allergies: Negative for polydipsia.       Negative polyphagia Negative hypoglycemia    PHYSICAL EXAM: Blood pressure 107/70, pulse 66, temperature 98.5 F (36.9 C), temperature source Oral, height 5\' 6"  (1.676 m), weight 258 lb (117 kg), SpO2 96 %. Body mass index is 41.64 kg/m. Physical Exam  RECENT LABS AND TESTS: BMET    Component Value Date/Time   NA 139 03/04/2017 1145   K 3.5 03/04/2017 1145   CL 93 (L) 03/04/2017 1145   CO2 33 (H) 03/04/2017 1145   GLUCOSE 89 03/04/2017 1145   GLUCOSE 123 (H)  11/07/2015 0901   BUN 10 03/04/2017 1145   CREATININE 0.84 03/04/2017 1145   CREATININE 0.89 09/08/2013 1631   CALCIUM 9.5 03/04/2017 1145   GFRNONAA 99 03/04/2017 1145   GFRAA 115 03/04/2017 1145   Lab Results  Component Value Date   HGBA1C 5.9 (H) 03/04/2017   HGBA1C 5.8 11/07/2015   Lab Results  Component Value Date   INSULIN 16.2 03/04/2017   CBC    Component Value Date/Time   WBC 8.5 03/04/2017 1145   WBC 8.8 11/07/2015 0901   RBC 5.18 03/04/2017 1145   RBC 5.54 11/07/2015 0901   HGB 15.3 11/07/2015 0901   HCT 42.9 03/04/2017 1145   PLT 348 03/04/2017 1145   MCV 83 03/04/2017 1145   MCH 27.6 03/04/2017 1145   MCH 27.6 09/08/2013 1631   MCHC 33.3 03/04/2017 1145   MCHC 33.1 11/07/2015 0901   RDW 14.1 03/04/2017 1145   LYMPHSABS 2.4 03/04/2017 1145   MONOABS 0.5 11/07/2015 0901   EOSABS 0.2 03/04/2017 1145   BASOSABS 0.0 03/04/2017 1145   Iron/TIBC/Ferritin/ %Sat No results found for: IRON, TIBC, FERRITIN, IRONPCTSAT Lipid Panel     Component Value Date/Time   CHOL 224 (H) 03/04/2017 1145   TRIG 131 03/04/2017 1145   HDL 32 (L) 03/04/2017 1145   CHOLHDL 6 11/07/2015 0901   VLDL 36.6 11/07/2015 0901   LDLCALC 166 (H) 03/04/2017 1145   Hepatic Function Panel     Component Value Date/Time   PROT 7.7 03/04/2017 1145   ALBUMIN 4.0 03/04/2017 1145   AST 27 03/04/2017 1145   ALT 34 03/04/2017 1145   ALKPHOS 57 03/04/2017 1145   BILITOT 0.4 03/04/2017 1145   BILIDIR 0.1 11/07/2015 0901   IBILI 0.3 09/08/2013 1631      Component Value Date/Time   TSH 0.735 03/04/2017 1145   TSH 0.96 11/07/2015 0901   TSH 0.769 09/08/2013 1631    ASSESSMENT AND PLAN: Vitamin D deficiency - Plan: Vitamin D, Ergocalciferol, (DRISDOL) 50000 units CAPS capsule  Prediabetes  At risk for diabetes mellitus  Morbid obesity ([place])  PLAN:  Vitamin D Deficiency Bryan Lee was informed that low vitamin D levels contributes to fatigue and are associated with obesity, breast,  and colon cancer. He agrees to continue to take prescription Vit D @50 ,000 IU every week, we will refill for 1 month and will follow up for routine testing of vitamin D, at least 2-3 times per year. He was informed of the risk  of over-replacement of vitamin D and agrees to not increase his dose unless he discusses this with [name] first.  Pre-Diabetes Bryan Lee will continue to work on weight loss, exercise, and decreasing simple carbohydrates in his diet to help decrease the risk of diabetes. We dicussed metformin including benefits and risks. He was informed that eating too many simple carbohydrates or too many calories at one sitting increases the likelihood of GI side effects. Bryan Lee agrees to continue metformin for now and a prescription was not written today. Bryan Lee agreed to follow up with [name] as directed to monitor his progress.  Diabetes risk counselling Bryan Lee was given extended (at least 15 minutes) diabetes prevention counseling today. He is 56 y.o. male and has risk factors for diabetes including obesity and pre-diabetes. We discussed intensive lifestyle modifications today with an emphasis on weight loss as well as increasing exercise and decreasing simple carbohydrates in his diet.  Obesity Bryan Lee is currently in the action stage of change. As such, his goal is to continue with weight loss efforts He has agreed to keep a food journal with 1450 to 1550 calories and 80+ grams of protein  Bryan Lee has been instructed to work up to a goal of 150 minutes of combined cardio and strengthening exercise per week for weight loss and overall health benefits. We discussed the following Behavioral Modification Strategies today: increase H2O and increasing lean protein intake  Bryan Lee has agreed to follow up with our clinic in 2 weeks. He was informed of the importance of frequent follow up visits to maximize his success with intensive lifestyle modifications for his multiple health conditions.  I,  Doreene Nest, am acting as scribe for Dennard Nip, MD  I have reviewed the above documentation for accuracy and completeness, and I agree with the above. -Dennard Nip, MD  OBESITY BEHAVIORAL INTERVENTION VISIT  Today's visit was # 6 out of 22.  Starting weight: 294 lbs Starting date: 03/04/17 Today's weight : 258 lbs Today's date: 06/01/2017 Total lbs lost to date: 53 (Patients must lose 7 lbs in the first 6 months to continue with counseling)   ASK: We discussed the diagnosis of obesity with Bryan Lee today and Bryan Lee agreed to give [name] permission to discuss obesity behavioral modification therapy today.  ASSESS: Bryan Lee has the diagnosis of obesity and his BMI today is 40.7 Bryan Lee is in the action stage of change   ADVISE: Bryan Lee was educated on the multiple health risks of obesity as well as the benefit of weight loss to improve his health. He was advised of the need for long term treatment and the importance of lifestyle modifications.  AGREE: Multiple dietary modification options and treatment options were discussed and  Bryan Lee agreed to keep a food journal with 1450 to 1550 calories and 80+ grams of protein  We discussed the following Behavioral Modification Strategies today:increase H2O and increasing lean protein intake

## 2017-06-04 ENCOUNTER — Encounter: Payer: Self-pay | Admitting: Gastroenterology

## 2017-06-14 ENCOUNTER — Encounter: Payer: 59 | Admitting: Gastroenterology

## 2017-06-17 ENCOUNTER — Ambulatory Visit (INDEPENDENT_AMBULATORY_CARE_PROVIDER_SITE_OTHER): Payer: Managed Care, Other (non HMO) | Admitting: Family Medicine

## 2017-06-17 VITALS — BP 109/74 | HR 61 | Temp 97.8°F | Ht 66.0 in | Wt 253.0 lb

## 2017-06-17 DIAGNOSIS — Z9189 Other specified personal risk factors, not elsewhere classified: Secondary | ICD-10-CM | POA: Diagnosis not present

## 2017-06-17 DIAGNOSIS — E782 Mixed hyperlipidemia: Secondary | ICD-10-CM | POA: Diagnosis not present

## 2017-06-17 DIAGNOSIS — R7303 Prediabetes: Secondary | ICD-10-CM

## 2017-06-17 DIAGNOSIS — E559 Vitamin D deficiency, unspecified: Secondary | ICD-10-CM | POA: Diagnosis not present

## 2017-06-17 MED ORDER — VITAMIN D (ERGOCALCIFEROL) 1.25 MG (50000 UNIT) PO CAPS: 50000.0000 [IU] | ORAL_CAPSULE | ORAL | 0 refills | Status: DC

## 2017-06-17 MED ORDER — METFORMIN HCL 500 MG PO TABS: 500.0000 mg | ORAL_TABLET | Freq: Every day | ORAL | 0 refills | Status: DC

## 2017-06-17 NOTE — Progress Notes (Signed)
Office: [phone]  /  Fax: [phone]   HPI:   Chief Complaint: OBESITY Bryan Lee is here to discuss his progress with his obesity treatment plan. He is on the  keep a food journal with 1450 to 1550 calories and 80+ grams of protein daily and is following his eating plan approximately 100 % of the time. He states he is exercising 0 minutes 0 times per week. Bryan Lee continues to do well with weight loss. He is keeping strict journal and getting protein in. His weight is 253 lb (114.8 kg) today and has had a weight loss of 5 pounds over a period of 2 weeks since his last visit. He has lost 41 lbs since starting treatment with [name].  Vitamin D deficiency Bryan Lee has a diagnosis of vitamin D deficiency. He is currently taking vit D and denies nausea, vomiting or muscle weakness.  Pre-Diabetes Bryan Lee has a diagnosis of pre-diabetes based on his elevated Hgb A1c and was informed this puts him at greater risk of developing diabetes. He is taking metformin currently and continues to work on diet and exercise to decrease risk of diabetes. He denies nausea, polyphagia or hypoglycemia.  At risk for diabetes Bryan Lee is at higher than average risk for developing diabetes due to his obesity and pre-diabetes. He currently denies polyuria or polydipsia.  ALLERGIES: Allergies  Allergen Reactions  . Lactose Intolerance (Gi) Other (See Comments)    Cramping and gas pain    MEDICATIONS: Current Outpatient Prescriptions on File Prior to Visit  Medication Sig Dispense Refill  . metFORMIN (GLUCOPHAGE) 500 MG tablet Take 1 tablet (500 mg total) by mouth daily with breakfast. 30 tablet 0  . Naproxen Sodium (ALEVE) 220 MG CAPS Take by mouth as needed.    . Vitamin D, Ergocalciferol, (DRISDOL) 50000 units CAPS capsule Take 1 capsule (50,000 Units total) by mouth every 7 (seven) days. 4 capsule 0   No current facility-administered medications on file prior to visit.     PAST MEDICAL HISTORY: Past  Medical History:  Diagnosis Date  . Achilles rupture   . Acid reflux   . Back pain   . Carpal tunnel syndrome   . Chest pain   . Constipation   . Dizzy spells   . Joint pain   . Lactose intolerance   . Lipoma   . Numbness    legs, hands  . Obesity   . Plantar fasciitis   . Shoulder pain    rotator cuff  . SOB (shortness of breath) on exertion     PAST SURGICAL HISTORY: Past Surgical History:  Procedure Laterality Date  . ACHILLES TENDON REPAIR     left  . HERNIA REPAIR    . lump removal    . VASECTOMY      SOCIAL HISTORY: Social History  Substance Use Topics  . Smoking status: Never Smoker  . Smokeless tobacco: Never Used  . Alcohol use Yes     Comment: social    FAMILY HISTORY: Family History  Problem Relation Age of Onset  . Coronary artery disease Father   . Heart failure Father   . Hypertension Father   . Sleep apnea Father   . Heart disease Father   . Arthritis Mother   . Obesity Mother   . Diabetes Unknown        grandmother  . Cancer Sister        GYN-type cancer  . GER disease Daughter   . GER disease Son   .  Diabetes Maternal Grandmother   . Cancer Maternal Grandmother        ovarian  . Cancer Paternal Grandmother        lung cancer, + tobacco and colon  . Seizures Sister     ROS: Review of Systems  Constitutional: Positive for weight loss.  Gastrointestinal: Negative for nausea and vomiting.  Genitourinary: Negative for frequency.  Musculoskeletal:       Negative muscle weakness  Endo/Heme/Allergies: Negative for polydipsia.       Negative polyphagia Negative hypoglycemia    PHYSICAL EXAM: Blood pressure 109/74, pulse 61, temperature 97.8 F (36.6 C), temperature source Oral, height 5\' 6"  (1.676 m), weight 253 lb (114.8 kg), SpO2 95 %. Body mass index is 40.84 kg/m. Physical Exam  Constitutional: He is oriented to person, place, and time. He appears well-developed and well-nourished.  Cardiovascular: Normal rate.     Pulmonary/Chest: Effort normal.  Musculoskeletal: Normal range of motion.  Neurological: He is oriented to person, place, and time.  Skin: Skin is warm and dry.  Psychiatric: He has a normal mood and affect. His behavior is normal.  Vitals reviewed.   RECENT LABS AND TESTS: BMET    Component Value Date/Time   NA 139 03/04/2017 1145   K 3.5 03/04/2017 1145   CL 93 (L) 03/04/2017 1145   CO2 33 (H) 03/04/2017 1145   GLUCOSE 89 03/04/2017 1145   GLUCOSE 123 (H) 11/07/2015 0901   BUN 10 03/04/2017 1145   CREATININE 0.84 03/04/2017 1145   CREATININE 0.89 09/08/2013 1631   CALCIUM 9.5 03/04/2017 1145   GFRNONAA 99 03/04/2017 1145   GFRAA 115 03/04/2017 1145   Lab Results  Component Value Date   HGBA1C 5.9 (H) 03/04/2017   HGBA1C 5.8 11/07/2015   Lab Results  Component Value Date   INSULIN 16.2 03/04/2017   CBC    Component Value Date/Time   WBC 8.5 03/04/2017 1145   WBC 8.8 11/07/2015 0901   RBC 5.18 03/04/2017 1145   RBC 5.54 11/07/2015 0901   HGB 14.3 03/04/2017 1145   HCT 42.9 03/04/2017 1145   PLT 348 03/04/2017 1145   MCV 83 03/04/2017 1145   MCH 27.6 03/04/2017 1145   MCH 27.6 09/08/2013 1631   MCHC 33.3 03/04/2017 1145   MCHC 33.1 11/07/2015 0901   RDW 14.1 03/04/2017 1145   LYMPHSABS 2.4 03/04/2017 1145   MONOABS 0.5 11/07/2015 0901   EOSABS 0.2 03/04/2017 1145   BASOSABS 0.0 03/04/2017 1145   Iron/TIBC/Ferritin/ %Sat No results found for: IRON, TIBC, FERRITIN, IRONPCTSAT Lipid Panel     Component Value Date/Time   CHOL 224 (H) 03/04/2017 1145   TRIG 131 03/04/2017 1145   HDL 32 (L) 03/04/2017 1145   CHOLHDL 6 11/07/2015 0901   VLDL 36.6 11/07/2015 0901   LDLCALC 166 (H) 03/04/2017 1145   Hepatic Function Panel     Component Value Date/Time   PROT 7.7 03/04/2017 1145   ALBUMIN 4.0 03/04/2017 1145   AST 27 03/04/2017 1145   ALT 34 03/04/2017 1145   ALKPHOS 57 03/04/2017 1145   BILITOT 0.4 03/04/2017 1145   BILIDIR 0.1 11/07/2015 0901    IBILI 0.3 09/08/2013 1631      Component Value Date/Time   TSH 0.735 03/04/2017 1145   TSH 0.96 11/07/2015 0901   TSH 0.769 09/08/2013 1631    ASSESSMENT AND PLAN: Prediabetes - Plan: Comprehensive metabolic panel, Hemoglobin A1c, Insulin, random, metFORMIN (GLUCOPHAGE) 500 MG tablet  Mixed hyperlipidemia - Plan: Lipid  Panel With LDL/HDL Ratio  Vitamin D deficiency - Plan: VITAMIN D 25 Hydroxy (Vit-D Deficiency, Fractures), Vitamin D, Ergocalciferol, (DRISDOL) 50000 units CAPS capsule  At risk for diabetes mellitus  Morbid obesity ([place])  PLAN:  Vitamin D Deficiency Bryan Lee was informed that low vitamin D levels contributes to fatigue and are associated with obesity, breast, and colon cancer. He agrees to continue to take prescription Vit D @50 ,000 IU every week, we will refill for 1 month and will check labs and will follow up for routine testing of vitamin D, at least 2-3 times per year. He was informed of the risk of over-replacement of vitamin D and agrees to not increase his dose unless he discusses this with [name] first. Bryan Lee agrees to follow up with our clinic in 3 weeks.  Pre-Diabetes Bryan Lee will continue to work on weight loss, exercise, and decreasing simple carbohydrates in his diet to help decrease the risk of diabetes. We dicussed metformin including benefits and risks. He was informed that eating too many simple carbohydrates or too many calories at one sitting increases the likelihood of GI side effects. Bryan Lee agrees to continue metformin for now and a prescription was written today for 1 month refill. We will check labs and Bryan Lee agreed to follow up with [name] as directed to monitor his progress.  Diabetes risk counselling Bryan Lee was given extended (at least 15 minutes) diabetes prevention counseling today. He is 56 y.o. male and has risk factors for diabetes including obesity and pre-diabetes. We discussed intensive lifestyle modifications today with an emphasis on  weight loss as well as increasing exercise and decreasing simple carbohydrates in his diet.  Obesity Bryan Lee is currently in the action stage of change. As such, his goal is to continue with weight loss efforts He has agreed to keep a food journal with 1450 to 1550 calories and 80+ grams of protein  Bryan Lee has been instructed to work up to a goal of 150 minutes of combined cardio and strengthening exercise per week for weight loss and overall health benefits. We discussed the following Behavioral Modification Strategies today: increasing lean protein intake and travel eating strategies  Bryan Lee has agreed to follow up with our clinic in 3 weeks. He was informed of the importance of frequent follow up visits to maximize his success with intensive lifestyle modifications for his multiple health conditions.  I, Doreene Nest, am acting as transcriptionist for Dennard Nip, MD  I have reviewed the above documentation for accuracy and completeness, and I agree with the above. -Dennard Nip, MD  OBESITY BEHAVIORAL INTERVENTION VISIT  Today's visit was # 7 out of 22.  Starting weight: 294 lbs Starting date: 03/04/17 Today's weight : 253 lbs Today's date: 06/17/2017 Total lbs lost to date: 72 (Patients must lose 7 lbs in the first 6 months to continue with counseling)   ASK: We discussed the diagnosis of obesity with Bryan Lee today and Bryan Lee agreed to give [name] permission to discuss obesity behavioral modification therapy today.  ASSESS: Bryan Lee has the diagnosis of obesity and his BMI today is 83.9 Bryan Lee is in the action stage of change   ADVISE: Bryan Lee was educated on the multiple health risks of obesity as well as the benefit of weight loss to improve his health. He was advised of the need for long term treatment and the importance of lifestyle modifications.  AGREE: Multiple dietary modification options and treatment options were discussed and  Bryan Lee agreed to keep a  food journal with 1450  to 1550 calories and 80+ grams of protein daily We discussed the following Behavioral Modification Strategies today: increasing lean protein intake and travel eating strategies

## 2017-06-18 LAB — INSULIN, RANDOM: INSULIN: 9.7 u[IU]/mL (ref 2.6–24.9)

## 2017-06-18 LAB — COMPREHENSIVE METABOLIC PANEL
ALK PHOS: 69 IU/L (ref 39–117)
ALT: 47 IU/L — AB (ref 0–44)
AST: 45 IU/L — AB (ref 0–40)
Albumin/Globulin Ratio: 1.1 — ABNORMAL LOW (ref 1.2–2.2)
Albumin: 3.9 g/dL (ref 3.5–5.5)
BILIRUBIN TOTAL: 0.5 mg/dL (ref 0.0–1.2)
BUN / CREAT RATIO: 14 (ref 9–20)
BUN: 11 mg/dL (ref 6–24)
CHLORIDE: 96 mmol/L (ref 96–106)
CO2: 29 mmol/L (ref 20–29)
Calcium: 9.4 mg/dL (ref 8.7–10.2)
Creatinine, Ser: 0.76 mg/dL (ref 0.76–1.27)
GFR calc Af Amer: 120 mL/min/{1.73_m2} (ref >59)
GFR calc non Af Amer: 103 mL/min/{1.73_m2} (ref >59)
Globulin, Total: 3.4 g/dL (ref 1.5–4.5)
Glucose: 87 mg/dL (ref 65–99)
Potassium: 3.3 mmol/L — ABNORMAL LOW (ref 3.5–5.2)
Sodium: 141 mmol/L (ref 134–144)
Total Protein: 7.3 g/dL (ref 6.0–8.5)

## 2017-06-18 LAB — LIPID PANEL WITH LDL/HDL RATIO
CHOLESTEROL TOTAL: 188 mg/dL (ref 100–199)
HDL: 35 mg/dL — ABNORMAL LOW (ref >39)
LDL Calculated: 134 mg/dL — ABNORMAL HIGH (ref 0–99)
LDl/HDL Ratio: 3.8 ratio — ABNORMAL HIGH (ref 0.0–3.6)
Triglycerides: 96 mg/dL (ref 0–149)
VLDL Cholesterol Cal: 19 mg/dL (ref 5–40)

## 2017-06-18 LAB — VITAMIN D 25 HYDROXY (VIT D DEFICIENCY, FRACTURES): Vit D, 25-Hydroxy: 34.7 ng/mL (ref 30.0–100.0)

## 2017-06-18 LAB — HEMOGLOBIN A1C
ESTIMATED AVERAGE GLUCOSE: 105 mg/dL
HEMOGLOBIN A1C: 5.3 % (ref 4.8–5.6)

## 2017-07-08 ENCOUNTER — Ambulatory Visit (INDEPENDENT_AMBULATORY_CARE_PROVIDER_SITE_OTHER): Payer: Managed Care, Other (non HMO) | Admitting: Family Medicine

## 2017-07-08 VITALS — BP 109/70 | HR 62 | Temp 98.6°F | Ht 66.0 in | Wt 254.0 lb

## 2017-07-08 DIAGNOSIS — Z6841 Body Mass Index (BMI) 40.0 and over, adult: Secondary | ICD-10-CM

## 2017-07-08 DIAGNOSIS — IMO0001 Reserved for inherently not codable concepts without codable children: Secondary | ICD-10-CM

## 2017-07-08 DIAGNOSIS — E669 Obesity, unspecified: Secondary | ICD-10-CM | POA: Diagnosis not present

## 2017-07-08 DIAGNOSIS — R7303 Prediabetes: Secondary | ICD-10-CM

## 2017-07-08 DIAGNOSIS — E559 Vitamin D deficiency, unspecified: Secondary | ICD-10-CM

## 2017-07-08 MED ORDER — VITAMIN D (ERGOCALCIFEROL) 1.25 MG (50000 UNIT) PO CAPS: 50000.0000 [IU] | ORAL_CAPSULE | ORAL | 0 refills | Status: DC

## 2017-07-08 MED ORDER — METFORMIN HCL 500 MG PO TABS: 500.0000 mg | ORAL_TABLET | Freq: Every day | ORAL | 0 refills | Status: DC

## 2017-07-08 NOTE — Progress Notes (Signed)
Office: [phone]  /  Fax: [phone]   HPI:   Chief Complaint: OBESITY Bryan Lee is here to discuss his progress with his obesity treatment plan. He is on the  keep a food journal with 1450 to 1550 calories and 80+ grams of protein  and is following his eating plan approximately 100 % of the time. He states he is walking 65miles, 10 miles 2 to 3 times per week. Bryan Lee and did very well not gaining weight. He is retaining some fluid today though. He is getting bored with journaling and would like to discuss different options. His weight is 254 lb (115.2 kg) today and has had a weight gain of 1 pound over a period of 3 weeks since his last visit. He has lost 40 lbs since starting treatment with [name].  Vitamin D deficiency Bryan Lee has a diagnosis of vitamin D deficiency. He is currently stable on vit D and denies nausea, vomiting or muscle weakness.  Pre-Diabetes Bryan Lee has a diagnosis of pre-diabetes based on his elevated Hgb A1c and was informed this puts him at greater risk of developing diabetes. Last A1c improved to 5.3 He is taking metformin currently and has decreased polyphagia on metformin. He continues to work on diet and exercise to decrease risk of diabetes. He denies nausea or hypoglycemia.   ALLERGIES: Allergies  Allergen Reactions  . Lactose Intolerance (Gi) Other (See Comments)    Cramping and gas pain    MEDICATIONS: Current Outpatient Prescriptions on File Prior to Visit  Medication Sig Dispense Refill  . Naproxen Sodium (ALEVE) 220 MG CAPS Take by mouth as needed.     No current facility-administered medications on file prior to visit.     PAST MEDICAL HISTORY: Past Medical History:  Diagnosis Date  . Achilles rupture   . Acid reflux   . Back pain   . Carpal tunnel syndrome   . Chest pain   . Constipation   . Dizzy spells   . Joint pain   . Lactose intolerance   . Lipoma   . Numbness    legs, hands  . Obesity   . Plantar fasciitis     . Shoulder pain    rotator cuff  . SOB (shortness of breath) on exertion     PAST SURGICAL HISTORY: Past Surgical History:  Procedure Laterality Date  . ACHILLES TENDON REPAIR     left  . HERNIA REPAIR    . lump removal    . VASECTOMY      SOCIAL HISTORY: Social History  Substance Use Topics  . Smoking status: Never Smoker  . Smokeless tobacco: Never Used  . Alcohol use Yes     Comment: social    FAMILY HISTORY: Family History  Problem Relation Age of Onset  . Coronary artery disease Father   . Heart failure Father   . Hypertension Father   . Sleep apnea Father   . Heart disease Father   . Arthritis Mother   . Obesity Mother   . Diabetes Unknown        grandmother  . Cancer Sister        GYN-type cancer  . GER disease Daughter   . GER disease Son   . Diabetes Maternal Grandmother   . Cancer Maternal Grandmother        ovarian  . Cancer Paternal Grandmother        lung cancer, + tobacco and colon  . Seizures Sister  ROS: Review of Systems  Constitutional: Negative for weight loss.  Gastrointestinal: Negative for nausea and vomiting.  Musculoskeletal:       Negative muscle weakness  Endo/Heme/Allergies:       Polyphagia Negative hypoglycemia     PHYSICAL EXAM: Blood pressure 109/70, pulse 62, temperature 98.6 F (37 C), temperature source Oral, height 5\' 6"  (1.676 m), weight 254 lb (115.2 kg), SpO2 95 %. Body mass index is 41 kg/m. Physical Exam  Constitutional: He is oriented to person, place, and time. He appears well-developed and well-nourished.  Cardiovascular: Normal rate.   Pulmonary/Chest: Effort normal.  Musculoskeletal: Normal range of motion.  Neurological: He is oriented to person, place, and time.  Skin: Skin is warm and dry.  Psychiatric: He has a normal mood and affect. His behavior is normal.  Vitals reviewed.   RECENT LABS AND TESTS: BMET    Component Value Date/Time   NA 141 06/17/2017 0900   K 3.3 (L) 06/17/2017  0900   CL 96 06/17/2017 0900   CO2 29 06/17/2017 0900   GLUCOSE 87 06/17/2017 0900   GLUCOSE 123 (H) 11/07/2015 0901   BUN 11 06/17/2017 0900   CREATININE 0.76 06/17/2017 0900   CREATININE 0.89 09/08/2013 1631   CALCIUM 9.4 06/17/2017 0900   GFRNONAA 103 06/17/2017 0900   GFRAA 120 06/17/2017 0900   Lab Results  Component Value Date   HGBA1C 5.3 06/17/2017   HGBA1C 5.9 (H) 03/04/2017   HGBA1C 5.8 11/07/2015   Lab Results  Component Value Date   INSULIN 9.7 06/17/2017   INSULIN 16.2 03/04/2017   CBC    Component Value Date/Time   WBC 8.5 03/04/2017 1145   WBC 8.8 11/07/2015 0901   RBC 5.18 03/04/2017 1145   RBC 5.54 11/07/2015 0901   HGB 14.3 03/04/2017 1145   HCT 42.9 03/04/2017 1145   PLT 348 03/04/2017 1145   MCV 83 03/04/2017 1145   MCH 27.6 03/04/2017 1145   MCH 27.6 09/08/2013 1631   MCHC 33.3 03/04/2017 1145   MCHC 33.1 11/07/2015 0901   RDW 14.1 03/04/2017 1145   LYMPHSABS 2.4 03/04/2017 1145   MONOABS 0.5 11/07/2015 0901   EOSABS 0.2 03/04/2017 1145   BASOSABS 0.0 03/04/2017 1145   Iron/TIBC/Ferritin/ %Sat No results found for: IRON, TIBC, FERRITIN, IRONPCTSAT Lipid Panel     Component Value Date/Time   CHOL 188 06/17/2017 0900   TRIG 96 06/17/2017 0900   HDL 35 (L) 06/17/2017 0900   CHOLHDL 6 11/07/2015 0901   VLDL 36.6 11/07/2015 0901   LDLCALC 134 (H) 06/17/2017 0900   Hepatic Function Panel     Component Value Date/Time   PROT 7.3 06/17/2017 0900   ALBUMIN 3.9 06/17/2017 0900   AST 45 (H) 06/17/2017 0900   ALT 47 (H) 06/17/2017 0900   ALKPHOS 69 06/17/2017 0900   BILITOT 0.5 06/17/2017 0900   BILIDIR 0.1 11/07/2015 0901   IBILI 0.3 09/08/2013 1631      Component Value Date/Time   TSH 0.735 03/04/2017 1145   TSH 0.96 11/07/2015 0901   TSH 0.769 09/08/2013 1631    ASSESSMENT AND PLAN: Prediabetes - Plan: metFORMIN (GLUCOPHAGE) 500 MG tablet  Vitamin D deficiency - Plan: Vitamin D, Ergocalciferol, (DRISDOL) 50000 units CAPS  capsule  Class 3 obesity with serious comorbidity and body mass index (BMI) of 40.0 to 44.9 in adult, unspecified obesity type ([place])  PLAN:  Vitamin D Deficiency Bryan Lee was informed that low vitamin D levels contributes to fatigue and are  associated with obesity, breast, and colon cancer. He agrees to continue to take prescription Vit D @50 ,000 IU every week, we will refill for 1 month and will follow up for routine testing of vitamin D, at least 2-3 times per year. He was informed of the risk of over-replacement of vitamin D and agrees to not increase his dose unless he discusses this with [name] first. Bryan Lee agrees to follow up with our clinic in 2 weeks.  Pre-Diabetes Bryan Lee will continue to work on weight loss, exercise, and decreasing simple carbohydrates in his diet to help decrease the risk of diabetes. We dicussed metformin including benefits and risks. He was informed that eating too many simple carbohydrates or too many calories at one sitting increases the likelihood of GI side effects. Bryan Lee requested metformin for now and a prescription was written today for 1 month refill. Bryan Lee agreed to follow up with [name] as directed to monitor his progress.  Obesity Bryan Lee. As such, his goal is to continue with weight loss efforts He has agreed to Lee to follow a lower carbohydrate, vegetable and lean protein rich diet plan Bryan Lee to work up to a goal of 150 minutes of combined cardio and strengthening exercise per week for weight loss and overall health benefits. We discussed the following Behavioral Modification Strategies today: increase H2O intake, increasing lean protein intake, decreasing simple carbohydrates  and decrease eating out  Bryan Lee has agreed to follow up with our clinic in 2 weeks. He was informed of the importance of frequent follow up visits to maximize his success with intensive lifestyle modifications for  his multiple health conditions.  I, Bryan Lee, am acting as transcriptionist for Dennard Nip, MD  I have reviewed the above documentation for accuracy and completeness, and I agree with the above. -Dennard Nip, MD  OBESITY BEHAVIORAL INTERVENTION VISIT  Today's visit was # 8 out of 22.  Starting weight: 294 lbs Starting date: 03/04/17 Today's weight : 254 lbs Today's date: 07/08/2017 Total lbs lost to date: 108 (Patients must lose 7 lbs in the first 6 months to continue with counseling)   ASK: We discussed the diagnosis of obesity with Bryan Lee today and Bryan Lee agreed to give [name] permission to discuss obesity behavioral modification therapy today.  ASSESS: Bryan Lee has the diagnosis of obesity and his BMI today is 41.1 Bryan Lee is in the action stage of Lee   ADVISE: Bryan Lee was educated on the multiple health risks of obesity as well as the benefit of weight loss to improve his health. He was advised of the need for long term treatment and the importance of lifestyle modifications.  AGREE: Multiple dietary modification options and treatment options were discussed and  Bryan Lee agreed to Lee to follow a lower carbohydrate, vegetable and lean protein rich diet plan We discussed the following Behavioral Modification Strategies today: increase H2O intake, increasing lean protein intake, decreasing simple carbohydrates  and decrease eating out

## 2017-07-22 ENCOUNTER — Ambulatory Visit (INDEPENDENT_AMBULATORY_CARE_PROVIDER_SITE_OTHER): Payer: Managed Care, Other (non HMO) | Admitting: Physician Assistant

## 2017-07-28 ENCOUNTER — Ambulatory Visit (INDEPENDENT_AMBULATORY_CARE_PROVIDER_SITE_OTHER): Payer: Managed Care, Other (non HMO) | Admitting: Family Medicine

## 2017-07-28 ENCOUNTER — Encounter (INDEPENDENT_AMBULATORY_CARE_PROVIDER_SITE_OTHER): Payer: Self-pay | Admitting: Family Medicine

## 2017-07-28 VITALS — BP 110/72 | HR 61 | Temp 98.0°F | Ht 66.0 in | Wt 245.0 lb

## 2017-07-28 DIAGNOSIS — Z6839 Body mass index (BMI) 39.0-39.9, adult: Secondary | ICD-10-CM

## 2017-07-28 DIAGNOSIS — Z9189 Other specified personal risk factors, not elsewhere classified: Secondary | ICD-10-CM

## 2017-07-28 DIAGNOSIS — E559 Vitamin D deficiency, unspecified: Secondary | ICD-10-CM

## 2017-07-28 DIAGNOSIS — R7303 Prediabetes: Secondary | ICD-10-CM | POA: Diagnosis not present

## 2017-07-28 DIAGNOSIS — E669 Obesity, unspecified: Secondary | ICD-10-CM

## 2017-07-28 DIAGNOSIS — IMO0001 Reserved for inherently not codable concepts without codable children: Secondary | ICD-10-CM

## 2017-07-28 MED ORDER — VITAMIN D (ERGOCALCIFEROL) 1.25 MG (50000 UNIT) PO CAPS: 50000.0000 [IU] | ORAL_CAPSULE | ORAL | 0 refills | Status: DC

## 2017-07-28 NOTE — Progress Notes (Signed)
Office: [phone]  /  Fax: [phone]   HPI:   Chief Complaint: OBESITY Bryan Lee is here to discuss his progress with his obesity treatment plan. He is on the lower carbohydrate, vegetable and lean protein rich diet plan and is following his eating plan approximately 100 % of the time. He states he is lifting weights for 45 minutes 3 times per week. [name] tolerated the low carbohydrate plan well and he is motivated about weight loss efforts. [name] would like more variety at lunch. His weight is 245 lb (111.1 kg) today and has had a weight loss of 9 pounds over a period of 3 weeks since his last visit. He has lost 49 lbs since starting treatment with [name].  Vitamin D deficiency Howell has a diagnosis of vitamin D deficiency. He is currently taking vit D and denies nausea, vomiting or muscle weakness.  Pre-Diabetes [name] has a diagnosis of pre-diabetes based on his elevated Hgb A1c and was informed this puts him at greater risk of developing diabetes. His A1c has improved to 5.3 He is taking metformin currently and continues to work on diet and exercise to decrease risk of diabetes. He denies nausea or hypoglycemia.   At risk for diabetes [name] is at higher than average risk for developing diabetes due to his obesity and pre-diabetes. He currently denies polyuria or polydipsia.   ALLERGIES: Allergies  Allergen Reactions  . Lactose Intolerance (Gi) Other (See Comments)    Cramping and gas pain    MEDICATIONS: Current Outpatient Prescriptions on File Prior to Visit  Medication Sig Dispense Refill  . metFORMIN (GLUCOPHAGE) 500 MG tablet Take 1 tablet (500 mg total) by mouth daily with breakfast. 30 tablet 0  . Naproxen Sodium (ALEVE) 220 MG CAPS Take by mouth as needed.     No current facility-administered medications on file prior to visit.     PAST MEDICAL HISTORY: Past Medical History:  Diagnosis Date  . Achilles rupture   . Acid reflux   . Back pain   . Carpal  tunnel syndrome   . Chest pain   . Constipation   . Dizzy spells   . Joint pain   . Lactose intolerance   . Lipoma   . Numbness    legs, hands  . Obesity   . Plantar fasciitis   . Shoulder pain    rotator cuff  . SOB (shortness of breath) on exertion     PAST SURGICAL HISTORY: Past Surgical History:  Procedure Laterality Date  . ACHILLES TENDON REPAIR     left  . HERNIA REPAIR    . lump removal    . VASECTOMY      SOCIAL HISTORY: Social History  Substance Use Topics  . Smoking status: Never Smoker  . Smokeless tobacco: Never Used  . Alcohol use Yes     Comment: social    FAMILY HISTORY: Family History  Problem Relation Age of Onset  . Coronary artery disease Father   . Heart failure Father   . Hypertension Father   . Sleep apnea Father   . Heart disease Father   . Arthritis Mother   . Obesity Mother   . Diabetes Unknown        grandmother  . Cancer Sister        GYN-type cancer  . GER disease Daughter   . GER disease Son   . Diabetes Maternal Grandmother   . Cancer Maternal Grandmother        ovarian  .  Cancer Paternal Grandmother        lung cancer, + tobacco and colon  . Seizures Sister     ROS: Review of Systems  Constitutional: Positive for weight loss.  Gastrointestinal: Negative for nausea and vomiting.  Genitourinary: Negative for frequency.  Musculoskeletal:       Negative muscle weakness  Endo/Heme/Allergies: Negative for polydipsia.       Negative hypoglycemia    PHYSICAL EXAM: Blood pressure 110/72, pulse 61, temperature 98 F (36.7 C), temperature source Oral, height 5\' 6"  (1.676 m), weight 245 lb (111.1 kg), SpO2 99 %. Body mass index is 39.54 kg/m. Physical Exam  Constitutional: He is oriented to person, place, and time. He appears well-developed and well-nourished.  Cardiovascular: Normal rate.   Pulmonary/Chest: Effort normal.  Musculoskeletal: Normal range of motion.  Neurological: He is oriented to person, place, and  time.  Skin: Skin is warm and dry.  Psychiatric: He has a normal mood and affect. His behavior is normal.  Vitals reviewed.   RECENT LABS AND TESTS: BMET    Component Value Date/Time   NA 141 06/17/2017 0900   K 3.3 (L) 06/17/2017 0900   CL 96 06/17/2017 0900   CO2 29 06/17/2017 0900   GLUCOSE 87 06/17/2017 0900   GLUCOSE 123 (H) 11/07/2015 0901   BUN 11 06/17/2017 0900   CREATININE 0.76 06/17/2017 0900   CREATININE 0.89 09/08/2013 1631   CALCIUM 9.4 06/17/2017 0900   GFRNONAA 103 06/17/2017 0900   GFRAA 120 06/17/2017 0900   Lab Results  Component Value Date   HGBA1C 5.3 06/17/2017   HGBA1C 5.9 (H) 03/04/2017   HGBA1C 5.8 11/07/2015   Lab Results  Component Value Date   INSULIN 9.7 06/17/2017   INSULIN 16.2 03/04/2017   CBC    Component Value Date/Time   WBC 8.5 03/04/2017 1145   WBC 8.8 11/07/2015 0901   RBC 5.18 03/04/2017 1145   RBC 5.54 11/07/2015 0901   HGB 14.3 03/04/2017 1145   HCT 42.9 03/04/2017 1145   PLT 348 03/04/2017 1145   MCV 83 03/04/2017 1145   MCH 27.6 03/04/2017 1145   MCH 27.6 09/08/2013 1631   MCHC 33.3 03/04/2017 1145   MCHC 33.1 11/07/2015 0901   RDW 14.1 03/04/2017 1145   LYMPHSABS 2.4 03/04/2017 1145   MONOABS 0.5 11/07/2015 0901   EOSABS 0.2 03/04/2017 1145   BASOSABS 0.0 03/04/2017 1145   Iron/TIBC/Ferritin/ %Sat No results found for: IRON, TIBC, FERRITIN, IRONPCTSAT Lipid Panel     Component Value Date/Time   CHOL 188 06/17/2017 0900   TRIG 96 06/17/2017 0900   HDL 35 (L) 06/17/2017 0900   CHOLHDL 6 11/07/2015 0901   VLDL 36.6 11/07/2015 0901   LDLCALC 134 (H) 06/17/2017 0900   Hepatic Function Panel     Component Value Date/Time   PROT 7.3 06/17/2017 0900   ALBUMIN 3.9 06/17/2017 0900   AST 45 (H) 06/17/2017 0900   ALT 47 (H) 06/17/2017 0900   ALKPHOS 69 06/17/2017 0900   BILITOT 0.5 06/17/2017 0900   BILIDIR 0.1 11/07/2015 0901   IBILI 0.3 09/08/2013 1631      Component Value Date/Time   TSH 0.735  03/04/2017 1145   TSH 0.96 11/07/2015 0901   TSH 0.769 09/08/2013 1631    ASSESSMENT AND PLAN: Vitamin D deficiency - Plan: Vitamin D, Ergocalciferol, (DRISDOL) 50000 units CAPS capsule  Prediabetes  At risk for diabetes mellitus  Class 2 obesity with serious comorbidity and body mass index (BMI)  of 39.0 to 39.9 in adult, unspecified obesity type  PLAN:  Vitamin D Deficiency Verlin was informed that low vitamin D levels contributes to fatigue and are associated with obesity, breast, and colon cancer. He agrees to continue to take prescription Vit D @50 ,000 IU every week, we will refill for 1 month and will follow up for routine testing of vitamin D, at least 2-3 times per year. He was informed of the risk of over-replacement of vitamin D and agrees to not increase his dose unless he discusses this with [name] first. Deadrick agrees to follow up with our clinic in 3 weeks.  Pre-Diabetes Joseangel will continue to work on weight loss, exercise, and decreasing simple carbohydrates in his diet to help decrease the risk of diabetes. We dicussed metformin including benefits and risks. He was informed that eating too many simple carbohydrates or too many calories at one sitting increases the likelihood of GI side effects. Ivory agrees to continue  metformin for now and a prescription was not written today. Jeffery agreed to follow up with [name] as directed to monitor his progress.  Diabetes risk counselling Edna was given extended (15 minutes) diabetes prevention counseling today. He is 56 y.o. male and has risk factors for diabetes including obesity and pre-diabetes. We discussed intensive lifestyle modifications today with an emphasis on weight loss as well as increasing exercise and decreasing simple carbohydrates in his diet.  Obesity Effrey is currently in the action stage of change. As such, his goal is to continue with weight loss efforts He has agreed to keep a food journal with 500  calories and 35+ grams of protein at lunch and follow the Category 3 plan Khamani has been instructed to work up to a goal of 150 minutes of combined cardio and strengthening exercise per week for weight loss and overall health benefits. We discussed the following Behavioral Modification Strategies today: increasing lean protein intake and keep a strict food journal  Kairo has agreed to follow up with our clinic in 3 weeks. He was informed of the importance of frequent follow up visits to maximize his success with intensive lifestyle modifications for his multiple health conditions.  I, Doreene Nest, am acting as transcriptionist for Lacy Duverney, PA-C  I have reviewed the above documentation for accuracy and completeness, and I agree with the above. -Lacy Duverney, PA-C  I have reviewed the above note and agree with the plan. -Dennard Nip, MD   OBESITY BEHAVIORAL INTERVENTION VISIT  Today's visit was # 9 out of 22.  Starting weight: 294 lbs Starting date: 03/04/17 Today's weight : 245 lbs Today's date: 07/28/2017 Total lbs lost to date: 32 (Patients must lose 7 lbs in the first 6 months to continue with counseling)   ASK: We discussed the diagnosis of obesity with [name] today and Juanda Crumble agreed to give [name] permission to discuss obesity behavioral modification therapy today.  ASSESS: Endi has the diagnosis of obesity and his BMI today is 39.6 Rafe is in the action stage of change   ADVISE: Vardaan was educated on the multiple health risks of obesity as well as the benefit of weight loss to improve his health. He was advised of the need for long term treatment and the importance of lifestyle modifications.  AGREE: Multiple dietary modification options and treatment options were discussed and  Antonia agreed to keep a food journal with 500 calories and 35+ grams of protein at lunch and follow the Category 3 plan We discussed the  following Behavioral Modification  Strategies today: increasing lean protein intake and keep a strict food journal

## 2017-08-13 ENCOUNTER — Encounter: Payer: Self-pay | Admitting: Family Medicine

## 2017-08-13 ENCOUNTER — Ambulatory Visit (INDEPENDENT_AMBULATORY_CARE_PROVIDER_SITE_OTHER): Payer: Managed Care, Other (non HMO) | Admitting: Family Medicine

## 2017-08-13 VITALS — BP 112/78 | HR 68 | Temp 98.5°F | Resp 17 | Ht 66.0 in | Wt 251.5 lb

## 2017-08-13 DIAGNOSIS — N644 Mastodynia: Secondary | ICD-10-CM

## 2017-08-13 DIAGNOSIS — Z125 Encounter for screening for malignant neoplasm of prostate: Secondary | ICD-10-CM

## 2017-08-13 DIAGNOSIS — R202 Paresthesia of skin: Secondary | ICD-10-CM | POA: Diagnosis not present

## 2017-08-13 DIAGNOSIS — Z0000 Encounter for general adult medical examination without abnormal findings: Secondary | ICD-10-CM | POA: Diagnosis not present

## 2017-08-13 DIAGNOSIS — M25512 Pain in left shoulder: Secondary | ICD-10-CM

## 2017-08-13 NOTE — Assessment & Plan Note (Signed)
New to provider.  Pt reports hx of this.  After chart review, pt had mammogram done 13 yrs ago in 2005.  Due to ongoing pain and lumps at 6:00 o'clock will refer for diagnostic mammo.  Pt expressed understanding and is in agreement w/ plan.

## 2017-08-13 NOTE — Assessment & Plan Note (Signed)
Pt continues to have R leg paresthesia despite 50 lb weight loss.  He denies weakness or instability to leg.  Unclear if this is meralgia paresthetica as he remains morbidly obese but due to ongoing sxs will refer to neuro for complete evaluation.  Pt expressed understanding and is in agreement w/ plan.

## 2017-08-13 NOTE — Assessment & Plan Note (Signed)
Pt's PE WNL w/ exception of morbid obesity, L breast lump.  Overdue for colonoscopy- will not do b/c of possible cost.  Is willing to complete cologuard.  UTD on Tdap.  Declines DRE.  Stressed need for healthy diet and regular exercise.  Reviewed labs done by Dr Leafy Ro- is due for PSA.  Anticipatory guidance provided.

## 2017-08-13 NOTE — Patient Instructions (Signed)
Follow up in 1 year or as needed We'll notify you of your PSA results We'll call you with your Ortho appt for the shoulder and the neuro appt for the numbness of the leg Please complete the cologuard as directed Continue to work on healthy diet and regular exercise- you're doing great! Call with any questions or concerns Enjoy the rest of your summer!!

## 2017-08-13 NOTE — Progress Notes (Signed)
   Subjective:    Patient ID: Bryan Lee, male    DOB: Jun 29, 1961, 56 y.o.   MRN: [id number]  HPI CPE- UTD on Tdap.  Due for colonoscopy (pt has been referred 2x previously)- pt cancelled the day prior due possibility of out of pocket expense of $1400.  Pt is open to idea of cologuard.   Review of Systems Patient reports no vision/hearing changes, anorexia, fever ,adenopathy, persistant/recurrent hoarseness, swallowing issues, chest pain, palpitations, edema, persistant/recurrent cough, hemoptysis, dyspnea (rest,exertional, paroxysmal nocturnal), gastrointestinal  bleeding (melena, rectal bleeding), abdominal pain, excessive heart burn, GU symptoms (dysuria, hematuria, voiding/incontinence issues) syncope, focal weakness, memory loss, skin/hair/nail changes, depression, anxiety, abnormal bruising/bleeding.   R leg is going numb despite 50 lb weight loss- sxs are intermittent, occur w/ activity and rest.  Numbness will extend from hip to knee.  Not having a shooting pain but 'i'm very uncomfortable' and has to sit down or change positions.  Denies weakness.  L shoulder pain- pt misses lifting weights, has anterior shoulder pain w/ any activity.  Has seen multiple PTs in attempts to rehab shoulder.    Objective:   Physical Exam General Appearance:    Alert, cooperative, no distress, appears stated age, obesity  Head:    Normocephalic, without obvious abnormality, atraumatic  Eyes:    PERRL, conjunctiva/corneas clear, EOM's intact, fundi    benign, both eyes       Ears:    Normal TM's and external ear canals, both ears  Nose:   Nares normal, septum midline, mucosa normal, no drainage   or sinus tenderness  Throat:   Lips, mucosa, and tongue normal; teeth and gums normal  Neck:   Supple, symmetrical, trachea midline, no adenopathy;       thyroid:  No enlargement/tenderness/nodules  Back:     Symmetric, no curvature, ROM normal, no CVA tenderness  Lungs:     Clear to auscultation  bilaterally, respirations unlabored  Chest wall:    Gynecomastia w/ TTP over L inferior breast and small, mobile lump at 6 o'clock  Heart:    Regular rate and rhythm, S1 and S2 normal, no murmur, rub   or gallop  Abdomen:     Soft, non-tender, bowel sounds active all four quadrants,    no masses, no organomegaly  Genitalia:    Normal male without lesion, masses,discharge or tenderness  Rectal:    Deferred at pt's request  Extremities:   Extremities normal, atraumatic, no cyanosis or edema  Pulses:   2+ and symmetric all extremities  Skin:   Skin color, texture, turgor normal, no rashes or lesions  Lymph nodes:   Cervical, supraclavicular, and axillary nodes normal  Neurologic:   CNII-XII intact. Normal strength, sensation and reflexes      throughout          Assessment & Plan:

## 2017-08-13 NOTE — Assessment & Plan Note (Signed)
New.  Pt has hx of R shoulder pain that improved w/ PT.  L shoulder pain has not responded to PT and is now interfering w/ his ability to do any sort of exercise activity.  Refer to Ortho for complete evaluation and tx.  Pt expressed understanding and is in agreement w/ plan.

## 2017-08-13 NOTE — Progress Notes (Signed)
Pre visit review using our clinic review tool, if applicable. No additional management support is needed unless otherwise documented below in the visit note. 

## 2017-08-14 LAB — PSA: PSA: 1 ng/mL (ref <=4.0)

## 2017-08-16 ENCOUNTER — Ambulatory Visit (INDEPENDENT_AMBULATORY_CARE_PROVIDER_SITE_OTHER): Payer: Managed Care, Other (non HMO) | Admitting: Physician Assistant

## 2017-08-16 VITALS — BP 112/75 | HR 62 | Temp 98.8°F | Ht 66.0 in | Wt 243.0 lb

## 2017-08-16 DIAGNOSIS — Z6839 Body mass index (BMI) 39.0-39.9, adult: Secondary | ICD-10-CM

## 2017-08-16 DIAGNOSIS — E669 Obesity, unspecified: Secondary | ICD-10-CM | POA: Insufficient documentation

## 2017-08-16 DIAGNOSIS — R7303 Prediabetes: Secondary | ICD-10-CM

## 2017-08-16 NOTE — Progress Notes (Addendum)
Office: [phone]  /  Fax: [phone]   HPI:   Chief Complaint: OBESITY Bryan Lee is here to discuss his progress with his obesity treatment plan. He is on the keep a food journal with 500 calories and 35+ grams of protein at lunch and follow the Category 3 plan and is following his eating plan approximately 90 % of the time. He states he is exercising 0 minutes 0 times per week. Bryan Lee continues to do well with weight loss. He is discouraged because he only lost 2 pounds and states he wants to try low carbohydrates to lose more. Bryan Lee denies cravings and states hunger is well controlled. His weight is 243 lb (110.2 kg) today and has had a weight loss of 2 pounds over a period of 2 to 3 weeks since his last visit. He has lost 51 lbs since starting treatment with [name].  Pre-Diabetes Bryan Lee has a diagnosis of pre-diabetes based on his elevated Hgb A1c and was informed this puts him at greater risk of developing diabetes. He is taking metformin currently and continues to work on diet and exercise to decrease risk of diabetes. He denies nausea or hypoglycemia.    ALLERGIES: Allergies  Allergen Reactions  . Lactose Intolerance (Gi) Other (See Comments)    Cramping and gas pain    MEDICATIONS: Current Outpatient Prescriptions on File Prior to Visit  Medication Sig Dispense Refill  . metFORMIN (GLUCOPHAGE) 500 MG tablet Take 1 tablet (500 mg total) by mouth daily with breakfast. 30 tablet 0  . Naproxen Sodium (ALEVE) 220 MG CAPS Take by mouth as needed.    . Vitamin D, Ergocalciferol, (DRISDOL) 50000 units CAPS capsule Take 1 capsule (50,000 Units total) by mouth every 7 (seven) days. 4 capsule 0   No current facility-administered medications on file prior to visit.     PAST MEDICAL HISTORY: Past Medical History:  Diagnosis Date  . Achilles rupture   . Acid reflux   . Back pain   . Carpal tunnel syndrome   . Chest pain   . Constipation   . Dizzy spells   . Joint pain   .  Lactose intolerance   . Lipoma   . Numbness    legs, hands  . Obesity   . Plantar fasciitis   . Shoulder pain    rotator cuff  . SOB (shortness of breath) on exertion     PAST SURGICAL HISTORY: Past Surgical History:  Procedure Laterality Date  . ACHILLES TENDON REPAIR     left  . HERNIA REPAIR    . lump removal    . VASECTOMY      SOCIAL HISTORY: Social History  Substance Use Topics  . Smoking status: Never Smoker  . Smokeless tobacco: Never Used  . Alcohol use Yes     Comment: social    FAMILY HISTORY: Family History  Problem Relation Age of Onset  . Coronary artery disease Father   . Heart failure Father   . Hypertension Father   . Sleep apnea Father   . Heart disease Father   . Arthritis Mother   . Obesity Mother   . Diabetes Unknown        grandmother  . Cancer Sister        GYN-type cancer  . GER disease Daughter   . GER disease Son   . Diabetes Maternal Grandmother   . Cancer Maternal Grandmother        ovarian  . Cancer Paternal Grandmother  lung cancer, + tobacco and colon  . Seizures Sister     ROS: Review of Systems  Constitutional: Positive for weight loss.  Gastrointestinal: Negative for nausea.  Endo/Heme/Allergies:       Negative hypoglycemia    PHYSICAL EXAM: Blood pressure 112/75, pulse 62, temperature 98.8 F (37.1 C), temperature source Oral, height 5\' 6"  (1.676 m), weight 243 lb (110.2 kg), SpO2 98 %. Body mass index is 39.22 kg/m. Physical Exam  Constitutional: He is oriented to person, place, and time. He appears well-developed and well-nourished.  Cardiovascular: Normal rate.   Pulmonary/Chest: Effort normal.  Musculoskeletal: Normal range of motion.  Neurological: He is oriented to person, place, and time.  Skin: Skin is warm and dry.  Psychiatric: He has a normal mood and affect. His behavior is normal.  Vitals reviewed.   RECENT LABS AND TESTS: BMET    Component Value Date/Time   NA 141 06/17/2017 0900     K 3.3 (L) 06/17/2017 0900   CL 96 06/17/2017 0900   CO2 29 06/17/2017 0900   GLUCOSE 87 06/17/2017 0900   GLUCOSE 123 (H) 11/07/2015 0901   BUN 11 06/17/2017 0900   CREATININE 0.76 06/17/2017 0900   CREATININE 0.89 09/08/2013 1631   CALCIUM 9.4 06/17/2017 0900   GFRNONAA 103 06/17/2017 0900   GFRAA 120 06/17/2017 0900   Lab Results  Component Value Date   HGBA1C 5.3 06/17/2017   HGBA1C 5.9 (H) 03/04/2017   HGBA1C 5.8 11/07/2015   Lab Results  Component Value Date   INSULIN 9.7 06/17/2017   INSULIN 16.2 03/04/2017   CBC    Component Value Date/Time   WBC 8.5 03/04/2017 1145   WBC 8.8 11/07/2015 0901   RBC 5.18 03/04/2017 1145   RBC 5.54 11/07/2015 0901   HGB 14.3 03/04/2017 1145   HCT 42.9 03/04/2017 1145   PLT 348 03/04/2017 1145   MCV 83 03/04/2017 1145   MCH 27.6 03/04/2017 1145   MCH 27.6 09/08/2013 1631   MCHC 33.3 03/04/2017 1145   MCHC 33.1 11/07/2015 0901   RDW 14.1 03/04/2017 1145   LYMPHSABS 2.4 03/04/2017 1145   MONOABS 0.5 11/07/2015 0901   EOSABS 0.2 03/04/2017 1145   BASOSABS 0.0 03/04/2017 1145   Iron/TIBC/Ferritin/ %Sat No results found for: IRON, TIBC, FERRITIN, IRONPCTSAT Lipid Panel     Component Value Date/Time   CHOL 188 06/17/2017 0900   TRIG 96 06/17/2017 0900   HDL 35 (L) 06/17/2017 0900   CHOLHDL 6 11/07/2015 0901   VLDL 36.6 11/07/2015 0901   LDLCALC 134 (H) 06/17/2017 0900   Hepatic Function Panel     Component Value Date/Time   PROT 7.3 06/17/2017 0900   ALBUMIN 3.9 06/17/2017 0900   AST 45 (H) 06/17/2017 0900   ALT 47 (H) 06/17/2017 0900   ALKPHOS 69 06/17/2017 0900   BILITOT 0.5 06/17/2017 0900   BILIDIR 0.1 11/07/2015 0901   IBILI 0.3 09/08/2013 1631      Component Value Date/Time   TSH 0.735 03/04/2017 1145   TSH 0.96 11/07/2015 0901   TSH 0.769 09/08/2013 1631    ASSESSMENT AND PLAN: Prediabetes  Class 2 obesity without serious comorbidity with body mass index (BMI) of 39.0 to 39.9 in adult, unspecified  obesity type  PLAN:  Pre-Diabetes Bryan Lee will continue to work on weight loss, exercise, and decreasing simple carbohydrates in his diet to help decrease the risk of diabetes. We dicussed metformin including benefits and risks. He was informed that eating too many  simple carbohydrates or too many calories at one sitting increases the likelihood of GI side effects. Bryan Lee agrees to continue metformin for now and a prescription was not written today. Bryan Lee agreed to follow up with [name] as directed to monitor his progress.  We spent > than 50% of the 15 minute visit on the counseling as documented in the note.  Obesity Bryan Lee is currently in the action stage of change. As such, his goal is to continue with weight loss efforts He has agreed to follow a lower carbohydrate, vegetable and lean protein rich diet plan Bryan Lee has been instructed to work up to a goal of 150 minutes of combined cardio and strengthening exercise per week for weight loss and overall health benefits. We discussed the following Behavioral Modification Strategies today: increasing lean protein intake and planning for success  Bryan Lee has agreed to follow up with our clinic in 3 weeks. He was informed of the importance of frequent follow up visits to maximize his success with intensive lifestyle modifications for his multiple health conditions.  I, Doreene Nest, am acting as transcriptionist for Lacy Duverney, PA-C  I have reviewed the above documentation for accuracy and completeness, and I agree with the above. -Lacy Duverney, PA-C    OBESITY BEHAVIORAL INTERVENTION VISIT  Today's visit was # 10 out of 22.  Starting weight: 294 lbs Starting date: 03/04/17 Today's weight : 243 lbs Today's date: 08/16/2017 Total lbs lost to date: 38 (Patients must lose 7 lbs in the first 6 months to continue with counseling)   ASK: We discussed the diagnosis of obesity with Bryan Lee today and Bryan Lee agreed to give [name]  permission to discuss obesity behavioral modification therapy today.  ASSESS: Bryan Lee has the diagnosis of obesity and his BMI today is 73.3 Bryan Lee is in the action stage of change   ADVISE: Bryan Lee was educated on the multiple health risks of obesity as well as the benefit of weight loss to improve his health. He was advised of the need for long term treatment and the importance of lifestyle modifications.  AGREE: Multiple dietary modification options and treatment options were discussed and  Bryan Lee agreed to follow a lower carbohydrate, vegetable and lean protein rich diet plan We discussed the following Behavioral Modification Strategies today: increasing lean protein intake and planning for success

## 2017-08-17 ENCOUNTER — Encounter: Payer: Self-pay | Admitting: Family Medicine

## 2017-08-17 ENCOUNTER — Ambulatory Visit (INDEPENDENT_AMBULATORY_CARE_PROVIDER_SITE_OTHER): Payer: Managed Care, Other (non HMO) | Admitting: Family Medicine

## 2017-08-17 VITALS — BP 124/74 | HR 78 | Temp 98.5°F | Resp 16 | Ht 65.0 in | Wt 248.0 lb

## 2017-08-17 DIAGNOSIS — Z202 Contact with and (suspected) exposure to infections with a predominantly sexual mode of transmission: Secondary | ICD-10-CM

## 2017-08-17 DIAGNOSIS — Z113 Encounter for screening for infections with a predominantly sexual mode of transmission: Secondary | ICD-10-CM

## 2017-08-17 NOTE — Patient Instructions (Addendum)
  I will check for chlamydia, gonorrhea, HIV and syphilis. As we discussed you can repeat testing in 6 weeks if needed. If any new symptoms including pain with urination, or penile discharge, please return as you would possibly need treatment sooner.   IF you received an x-ray today, you will receive an invoice from [facility]. Please contact [facility] at [phone] with questions or concerns regarding your invoice.   IF you received labwork today, you will receive an invoice from [facility]. Please contact [facility] at [phone] with questions or concerns regarding your invoice.   Our billing staff will not be able to assist you with questions regarding bills from these companies.  You will be contacted with the lab results as soon as they are available. The fastest way to get your results is to activate your My Chart account. Instructions are located on the last page of this paperwork. If you have not heard from [name] regarding the results in 2 weeks, please contact this office.

## 2017-08-17 NOTE — Progress Notes (Signed)
Subjective:  By signing my name below, I, Moises Blood, attest that this documentation has been prepared under the direction and in the presence of Merri Ray, MD. Electronically Signed: Moises Blood, [place]. 08/17/2017 , 3:33 PM .  Patient was seen in Room 11 .   Patient ID: Bryan Lee, male    DOB: 1961/07/01, 56 y.o.   MRN: [id number] Chief Complaint  Patient presents with  . Exposure to STD   HPI Bryan Lee is a 56 y.o. male  Patient had possible exposure to STD about 3 weeks ago. He received oral sex from a woman. Another woman who lives with the woman he had sexual relations with, informed him that his partner was diagnosed with gonorrhea today. His partner told the patient that she had an UTI. He has had unprotected intercourse with his wife since this possible exposure. His spouse does not know yet. He denies dysuria, penile discharge, penile pain, rash in his groin, fever or flu-like symptoms.   His last HIV testing was done in Feb.   Patient Active Problem List   Diagnosis Date Noted  . Prediabetes 08/16/2017  . Class 2 obesity without serious comorbidity with body mass index (BMI) of 39.0 to 39.9 in adult 08/16/2017  . Vitamin D deficiency 03/18/2017  . Paresthesia of right leg 11/02/2015  . Morbid obesity ([place]) 11/02/2015  . Left shoulder pain 04/09/2014  . Routine general medical examination at a health care facility 09/08/2013  . Hematoma 07/06/2011  . Breast pain, left 07/06/2011  . LYMPH NODE-ENLARGED 04/05/2009  . NEOPLASMS UNSPEC NATURE BONE SOFT TISSUE&SKIN 02/20/2009   Past Medical History:  Diagnosis Date  . Achilles rupture   . Acid reflux   . Back pain   . Carpal tunnel syndrome   . Chest pain   . Constipation   . Dizzy spells   . Joint pain   . Lactose intolerance   . Lipoma   . Numbness    legs, hands  . Obesity   . Plantar fasciitis   . Shoulder pain    rotator cuff  . SOB (shortness of breath) on exertion    Past  Surgical History:  Procedure Laterality Date  . ACHILLES TENDON REPAIR     left  . HERNIA REPAIR    . lump removal    . VASECTOMY     Allergies  Allergen Reactions  . Lactose Intolerance (Gi) Other (See Comments)    Cramping and gas pain   Prior to Admission medications   Medication Sig Start Date End Date Taking? Authorizing Provider  metFORMIN (GLUCOPHAGE) 500 MG tablet Take 1 tablet (500 mg total) by mouth daily with breakfast. 07/08/17   Dennard Nip D, MD  Naproxen Sodium (ALEVE) 220 MG CAPS Take by mouth as needed.    [provider]  Vitamin D, Ergocalciferol, (DRISDOL) 50000 units CAPS capsule Take 1 capsule (50,000 Units total) by mouth every 7 (seven) days. 07/28/17   Starlyn Skeans, MD   Social History   Social History  . Marital status: Married    Spouse name: Rosemarie Ax  . Number of children: 2  . Years of education: Master's Degree   Occupational History  . VP Operations     AT [employer]   Social History Main Topics  . Smoking status: Never Smoker  . Smokeless tobacco: Never Used  . Alcohol use Yes     Comment: social  . Drug use: No  . Sexual activity: Yes  Partners: Female    Birth control/ protection: Surgical   Other Topics Concern  . Not on file   Social History Narrative   Lives with his wife and children.   Review of Systems  Constitutional: Negative for chills, diaphoresis, fatigue and fever.  Gastrointestinal: Negative for diarrhea, nausea and vomiting.  Genitourinary: Negative for discharge, dysuria, genital sores, penile pain and testicular pain.  Skin: Negative for rash and wound.       Objective:   Physical Exam  Constitutional: He is oriented to person, place, and time. He appears well-developed and well-nourished. No distress.  HENT:  Head: Normocephalic and atraumatic.  Eyes: Pupils are equal, round, and reactive to light. EOM are normal.  Neck: Neck supple.  Cardiovascular: Normal rate.   Pulmonary/Chest: Effort  normal. No respiratory distress.  Musculoskeletal: Normal range of motion.  Neurological: He is alert and oriented to person, place, and time.  Skin: Skin is warm and dry.  Psychiatric: He has a normal mood and affect. His behavior is normal.  Nursing note and vitals reviewed.   Vitals:   08/17/17 1454  BP: 124/74  Pulse: 78  Resp: 16  Temp: 98.5 F (36.9 C)  TempSrc: Oral  SpO2: 97%  Weight: 248 lb (112.5 kg)  Height: 5\' 5"  (1.651 m)       Assessment & Plan:  Bryan Lee is a 56 y.o. male Exposure to gonorrhea - Plan: GC/Chlamydia Probe Amp, HIV antibody, RPR  Routine screening for STI (sexually transmitted infection) - Plan: GC/Chlamydia Probe Amp, HIV antibody, RPR Possible exposure to STI, but asymptomatic at present.   - check STI testing above, and plans on discussing situation with wife to also discuss testing with her.    -consider repeat testing in 6 weeks.   No orders of the defined types were placed in this encounter.  Patient Instructions    I will check for chlamydia, gonorrhea, HIV and syphilis. As we discussed you can repeat testing in 6 weeks if needed. If any new symptoms including pain with urination, or penile discharge, please return as you would possibly need treatment sooner.   IF you received an x-ray today, you will receive an invoice from [facility]. Please contact [facility] at [phone] with questions or concerns regarding your invoice.   IF you received labwork today, you will receive an invoice from [facility]. Please contact [facility] at [phone] with questions or concerns regarding your invoice.   Our billing staff will not be able to assist you with questions regarding bills from these companies.  You will be contacted with the lab results as soon as they are available. The fastest way to get your results is to activate your My Chart account. Instructions are located on the last page of this paperwork. If you  have not heard from [name] regarding the results in 2 weeks, please contact this office.       I personally performed the services described in this documentation, which was scribed in my presence. The recorded information has been reviewed and considered for accuracy and completeness, addended by me as needed, and agree with information above.  Signed,   Merri Ray, MD [facility].  08/17/17 11:33 PM

## 2017-08-18 ENCOUNTER — Encounter: Payer: Self-pay | Admitting: Family Medicine

## 2017-08-18 LAB — HIV ANTIBODY (ROUTINE TESTING W REFLEX): HIV SCREEN 4TH GENERATION: NONREACTIVE

## 2017-08-18 LAB — GC/CHLAMYDIA PROBE AMP
CHLAMYDIA, DNA PROBE: NEGATIVE
Neisseria gonorrhoeae by PCR: NEGATIVE

## 2017-08-18 LAB — RPR: RPR: NONREACTIVE

## 2017-08-27 LAB — COLOGUARD: COLOGUARD: NEGATIVE

## 2017-09-01 ENCOUNTER — Other Ambulatory Visit: Payer: Self-pay | Admitting: Family Medicine

## 2017-09-01 DIAGNOSIS — N644 Mastodynia: Secondary | ICD-10-CM

## 2017-09-06 ENCOUNTER — Encounter: Payer: Self-pay | Admitting: General Practice

## 2017-09-06 ENCOUNTER — Ambulatory Visit (INDEPENDENT_AMBULATORY_CARE_PROVIDER_SITE_OTHER): Payer: Managed Care, Other (non HMO) | Admitting: Physician Assistant

## 2017-09-06 VITALS — BP 114/75 | HR 64 | Temp 98.0°F | Ht 65.0 in | Wt 237.0 lb

## 2017-09-06 DIAGNOSIS — E559 Vitamin D deficiency, unspecified: Secondary | ICD-10-CM

## 2017-09-06 DIAGNOSIS — Z6839 Body mass index (BMI) 39.0-39.9, adult: Secondary | ICD-10-CM

## 2017-09-06 DIAGNOSIS — IMO0001 Reserved for inherently not codable concepts without codable children: Secondary | ICD-10-CM

## 2017-09-06 DIAGNOSIS — E7849 Other hyperlipidemia: Secondary | ICD-10-CM

## 2017-09-06 DIAGNOSIS — Z9189 Other specified personal risk factors, not elsewhere classified: Secondary | ICD-10-CM

## 2017-09-06 DIAGNOSIS — E784 Other hyperlipidemia: Secondary | ICD-10-CM

## 2017-09-06 DIAGNOSIS — E669 Obesity, unspecified: Secondary | ICD-10-CM

## 2017-09-06 MED ORDER — VITAMIN D (ERGOCALCIFEROL) 1.25 MG (50000 UNIT) PO CAPS
50000.0000 [IU] | ORAL_CAPSULE | ORAL | 0 refills | Status: DC
Start: 2017-09-06 — End: 2017-09-27

## 2017-09-06 NOTE — Progress Notes (Signed)
Office: [phone]  /  Fax: [phone]   HPI:   Chief Complaint: OBESITY [name] is here to discuss his progress with his obesity treatment plan. He is on the lower carbohydrate, vegetable and lean protein rich diet plan and is following his eating plan approximately 100 % of the time. He states he is exercising 0 minutes 0 times per week. Bryan Lee continues to do well with weight loss. He tolerated the lower carbohydrate plan well and hunger was well controlled. Bryan Lee would like more variety at his meals. His weight is 237 lb (107.5 kg) today and has had a weight loss of 6 pounds over a period of 3 weeks since his last visit. He has lost 57 lbs since starting treatment with [name].  Vitamin D deficiency Bryan Lee has a diagnosis of vitamin D deficiency. He is currently taking vit D and denies nausea, vomiting or muscle weakness.  Hyperlipidemia Bryan Lee has hyperlipidemia, LDL is >100 and he is not currently on a statin. He declines statin at this time. Bryan Lee has been trying to improve his cholesterol levels with intensive lifestyle modification including a low saturated fat diet, exercise and weight loss. He denies any chest pain, claudication or myalgias.  At risk for cardiovascular disease Bryan Lee is at a higher than average risk for cardiovascular disease due to obesity and hyperlipidemia. He currently denies any chest pain.   ALLERGIES: Allergies  Allergen Reactions  . Lactose Intolerance (Gi) Other (See Comments)    Cramping and gas pain    MEDICATIONS: Current Outpatient Prescriptions on File Prior to Visit  Medication Sig Dispense Refill  . metFORMIN (GLUCOPHAGE) 500 MG tablet Take 1 tablet (500 mg total) by mouth daily with breakfast. 30 tablet 0  . Naproxen Sodium (ALEVE) 220 MG CAPS Take by mouth as needed.     No current facility-administered medications on file prior to visit.     PAST MEDICAL HISTORY: Past Medical History:  Diagnosis Date  . Achilles rupture    . Acid reflux   . Back pain   . Carpal tunnel syndrome   . Chest pain   . Constipation   . Dizzy spells   . Joint pain   . Lactose intolerance   . Lipoma   . Numbness    legs, hands  . Obesity   . Plantar fasciitis   . Shoulder pain    rotator cuff  . SOB (shortness of breath) on exertion     PAST SURGICAL HISTORY: Past Surgical History:  Procedure Laterality Date  . ACHILLES TENDON REPAIR     left  . HERNIA REPAIR    . lump removal    . VASECTOMY      SOCIAL HISTORY: Social History  Substance Use Topics  . Smoking status: Never Smoker  . Smokeless tobacco: Never Used  . Alcohol use Yes     Comment: social    FAMILY HISTORY: Family History  Problem Relation Age of Onset  . Coronary artery disease Father   . Heart failure Father   . Hypertension Father   . Sleep apnea Father   . Heart disease Father   . Arthritis Mother   . Obesity Mother   . Diabetes Unknown        grandmother  . Cancer Sister        GYN-type cancer  . GER disease Daughter   . GER disease Son   . Diabetes Maternal Grandmother   . Cancer Maternal Grandmother  ovarian  . Cancer Paternal Grandmother        lung cancer, + tobacco and colon  . Seizures Sister     ROS: Review of Systems  Constitutional: Positive for weight loss.  Cardiovascular: Negative for chest pain and claudication.  Gastrointestinal: Negative for nausea and vomiting.  Musculoskeletal: Negative for myalgias.       Negative muscle weakness    PHYSICAL EXAM: Blood pressure 114/75, pulse 64, temperature 98 F (36.7 C), temperature source Oral, height 5\' 5"  (1.651 m), weight 237 lb (107.5 kg), SpO2 97 %. Body mass index is 39.44 kg/m. Physical Exam  Constitutional: He is oriented to person, place, and time. He appears well-developed and well-nourished.  Cardiovascular: Normal rate.   Pulmonary/Chest: Effort normal.  Musculoskeletal: Normal range of motion.  Neurological: He is oriented to person,  place, and time.  Skin: Skin is warm and dry.  Psychiatric: He has a normal mood and affect. His behavior is normal.  Vitals reviewed.   RECENT LABS AND TESTS: BMET    Component Value Date/Time   NA 141 06/17/2017 0900   K 3.3 (L) 06/17/2017 0900   CL 96 06/17/2017 0900   CO2 29 06/17/2017 0900   GLUCOSE 87 06/17/2017 0900   GLUCOSE 123 (H) 11/07/2015 0901   BUN 11 06/17/2017 0900   CREATININE 0.76 06/17/2017 0900   CREATININE 0.89 09/08/2013 1631   CALCIUM 9.4 06/17/2017 0900   GFRNONAA 103 06/17/2017 0900   GFRAA 120 06/17/2017 0900   Lab Results  Component Value Date   HGBA1C 5.3 06/17/2017   HGBA1C 5.9 (H) 03/04/2017   HGBA1C 5.8 11/07/2015   Lab Results  Component Value Date   INSULIN 9.7 06/17/2017   INSULIN 16.2 03/04/2017   CBC    Component Value Date/Time   WBC 8.5 03/04/2017 1145   WBC 8.8 11/07/2015 0901   RBC 5.18 03/04/2017 1145   RBC 5.54 11/07/2015 0901   HGB 14.3 03/04/2017 1145   HCT 42.9 03/04/2017 1145   PLT 348 03/04/2017 1145   MCV 83 03/04/2017 1145   MCH 27.6 03/04/2017 1145   MCH 27.6 09/08/2013 1631   MCHC 33.3 03/04/2017 1145   MCHC 33.1 11/07/2015 0901   RDW 14.1 03/04/2017 1145   LYMPHSABS 2.4 03/04/2017 1145   MONOABS 0.5 11/07/2015 0901   EOSABS 0.2 03/04/2017 1145   BASOSABS 0.0 03/04/2017 1145   Iron/TIBC/Ferritin/ %Sat No results found for: IRON, TIBC, FERRITIN, IRONPCTSAT Lipid Panel     Component Value Date/Time   CHOL 188 06/17/2017 0900   TRIG 96 06/17/2017 0900   HDL 35 (L) 06/17/2017 0900   CHOLHDL 6 11/07/2015 0901   VLDL 36.6 11/07/2015 0901   LDLCALC 134 (H) 06/17/2017 0900   Hepatic Function Panel     Component Value Date/Time   PROT 7.3 06/17/2017 0900   ALBUMIN 3.9 06/17/2017 0900   AST 45 (H) 06/17/2017 0900   ALT 47 (H) 06/17/2017 0900   ALKPHOS 69 06/17/2017 0900   BILITOT 0.5 06/17/2017 0900   BILIDIR 0.1 11/07/2015 0901   IBILI 0.3 09/08/2013 1631      Component Value Date/Time   TSH  0.735 03/04/2017 1145   TSH 0.96 11/07/2015 0901   TSH 0.769 09/08/2013 1631    ASSESSMENT AND PLAN: Vitamin D deficiency - Plan: Vitamin D, Ergocalciferol, (DRISDOL) 50000 units CAPS capsule  Other hyperlipidemia  At risk for heart disease  Class 2 obesity with serious comorbidity and body mass index (BMI) of 39.0 to  39.9 in adult, unspecified obesity type  PLAN:  Vitamin D Deficiency Bryan Lee was informed that low vitamin D levels contributes to fatigue and are associated with obesity, breast, and colon cancer. He agrees to continue to take prescription Vit D @50 ,000 IU every week, we will refill for 1 month and will follow up for routine testing of vitamin D, at least 2-3 times per year. He was informed of the risk of over-replacement of vitamin D and agrees to not increase his dose unless he discusses this with [name] first. Bryan Lee agrees to follow up with our clinic in 3 weeks.  Hyperlipidemia Bradley was informed of the American Heart Association Guidelines emphasizing intensive lifestyle modifications as the first line treatment for hyperlipidemia. We discussed many lifestyle modifications today in depth, and Rishith will continue to work on decreasing saturated fats such as fatty red meat, butter and many fried foods. He will also increase vegetables and lean protein in his diet and continue to work on exercise and weight loss efforts.  Cardiovascular risk counseling Bryan Lee was given extended (15 minutes) coronary artery disease prevention counseling today. He is 56 y.o. male and has risk factors for heart disease including obesity and hyperlipidemia. We discussed intensive lifestyle modifications today with an emphasis on specific weight loss instructions and strategies. Pt was also informed of the importance of increasing exercise and decreasing saturated fats to help prevent heart disease.  Obesity Bryan Lee is currently in the action stage of change. As such, his goal is to continue  with weight loss efforts He has agreed to keep a food journal with 1450 to 1550 calories and 100+ grams of protein daily Bryan Lee has been instructed to work up to a goal of 150 minutes of combined cardio and strengthening exercise per week for weight loss and overall health benefits. We discussed the following Behavioral Modification Strategies today: increasing lean protein intake and work on meal planning and easy cooking plans  Bryan Lee has agreed to follow up with our clinic in 3 weeks. He was informed of the importance of frequent follow up visits to maximize his success with intensive lifestyle modifications for his multiple health conditions.  I, Doreene Nest, am acting as transcriptionist for Lacy Duverney, PA-C  I have reviewed the above documentation for accuracy and completeness, and I agree with the above. -Lacy Duverney, PA-C  I have reviewed the above note and agree with the plan. -Dennard Nip, MD   OBESITY BEHAVIORAL INTERVENTION VISIT  Today's visit was # 11 out of 22.  Starting weight: 294 lbs Starting date: 03/04/17 Today's weight : 237 lbs Today's date: 09/06/2017 Total lbs lost to date: 21 (Patients must lose 7 lbs in the first 6 months to continue with counseling)   ASK: We discussed the diagnosis of obesity with Bryan Lee today and Bryan Lee agreed to give [name] permission to discuss obesity behavioral modification therapy today.  ASSESS: Bryan Lee has the diagnosis of obesity and his BMI today is 39.44 Bryan Lee is in the action stage of change   ADVISE: Bryan Lee was educated on the multiple health risks of obesity as well as the benefit of weight loss to improve his health. He was advised of the need for long term treatment and the importance of lifestyle modifications.  AGREE: Multiple dietary modification options and treatment options were discussed and  Bryan Lee agreed to keep a food journal with 1450 to 1550 calories and 100+ grams of protein  We discussed the  following Behavioral Modification Strategies today: increasing lean  protein intake and work on meal planning and easy cooking plans

## 2017-09-10 ENCOUNTER — Ambulatory Visit
Admission: RE | Admit: 2017-09-10 | Discharge: 2017-09-10 | Disposition: A | Discharge status: Home / Self Care | Payer: Managed Care, Other (non HMO) | Source: Ambulatory Visit | Attending: Family Medicine | Admitting: Family Medicine

## 2017-09-10 DIAGNOSIS — N644 Mastodynia: Secondary | ICD-10-CM

## 2017-09-27 ENCOUNTER — Ambulatory Visit (INDEPENDENT_AMBULATORY_CARE_PROVIDER_SITE_OTHER): Payer: Managed Care, Other (non HMO) | Admitting: Physician Assistant

## 2017-09-27 VITALS — BP 94/63 | HR 59 | Temp 98.3°F | Ht 65.0 in | Wt 235.0 lb

## 2017-09-27 DIAGNOSIS — Z9189 Other specified personal risk factors, not elsewhere classified: Secondary | ICD-10-CM | POA: Diagnosis not present

## 2017-09-27 DIAGNOSIS — R7303 Prediabetes: Secondary | ICD-10-CM

## 2017-09-27 DIAGNOSIS — Z6841 Body Mass Index (BMI) 40.0 and over, adult: Secondary | ICD-10-CM | POA: Diagnosis not present

## 2017-09-27 DIAGNOSIS — E559 Vitamin D deficiency, unspecified: Secondary | ICD-10-CM | POA: Diagnosis not present

## 2017-09-27 MED ORDER — METFORMIN HCL 500 MG PO TABS: 500.0000 mg | ORAL_TABLET | Freq: Two times a day (BID) | ORAL | 0 refills | Status: DC

## 2017-09-27 MED ORDER — VITAMIN D (ERGOCALCIFEROL) 1.25 MG (50000 UNIT) PO CAPS: 50000.0000 [IU] | ORAL_CAPSULE | ORAL | 0 refills | Status: DC

## 2017-09-27 NOTE — Progress Notes (Signed)
Office: [phone]  /  Fax: [phone]   HPI:   Chief Complaint: OBESITY Bryan Lee is here to discuss his progress with his obesity treatment plan. He is on the keep a food journal with 1450 to 1550 calories and 100+ grams of protein daily and is following his eating plan approximately 90 % of the time. He states he is exercising 0 minutes 0 times per week. Rea continues to do well with weight loss. He plans his meals ahead well but noticed an increase in hunger in the evening. His weight is 235 lb (106.6 kg) today and has had a weight loss of 2 pounds over a period of 3 weeks since his last visit. He has lost 59 lbs since starting treatment with [name].  Vitamin D deficiency Delando has a diagnosis of vitamin D deficiency. He is currently taking vit D and denies nausea, vomiting or muscle weakness.  Pre-Diabetes Aztlan has a diagnosis of pre-diabetes based on his elevated Hgb A1c and was informed this puts him at greater risk of developing diabetes. He is taking metformin currently and notes polyphagia in the evening. He continues to work on diet and exercise to decrease risk of diabetes. He denies nausea or hypoglycemia.  At risk for diabetes Haidar is at higher than average risk for developing diabetes due to his obesity and pre-diabetes. He currently denies polyuria or polydipsia.  ALLERGIES: Allergies  Allergen Reactions  . Lactose Intolerance (Gi) Other (See Comments)    Cramping and gas pain    MEDICATIONS: Current Outpatient Prescriptions on File Prior to Visit  Medication Sig Dispense Refill  . Naproxen Sodium (ALEVE) 220 MG CAPS Take by mouth as needed.     No current facility-administered medications on file prior to visit.     PAST MEDICAL HISTORY: Past Medical History:  Diagnosis Date  . Achilles rupture   . Acid reflux   . Back pain   . Carpal tunnel syndrome   . Chest pain   . Constipation   . Dizzy spells   . Joint pain   . Lactose intolerance   .  Lipoma   . Numbness    legs, hands  . Obesity   . Plantar fasciitis   . Shoulder pain    rotator cuff  . SOB (shortness of breath) on exertion     PAST SURGICAL HISTORY: Past Surgical History:  Procedure Laterality Date  . ACHILLES TENDON REPAIR     left  . HERNIA REPAIR    . lump removal    . VASECTOMY      SOCIAL HISTORY: Social History  Substance Use Topics  . Smoking status: Never Smoker  . Smokeless tobacco: Never Used  . Alcohol use Yes     Comment: social    FAMILY HISTORY: Family History  Problem Relation Age of Onset  . Coronary artery disease Father   . Heart failure Father   . Hypertension Father   . Sleep apnea Father   . Heart disease Father   . Arthritis Mother   . Obesity Mother   . Diabetes Unknown        grandmother  . Cancer Sister        GYN-type cancer  . GER disease Daughter   . GER disease Son   . Diabetes Maternal Grandmother   . Cancer Maternal Grandmother        ovarian  . Cancer Paternal Grandmother        lung cancer, + tobacco and colon  .  Seizures Sister     ROS: Review of Systems  Constitutional: Positive for weight loss.  Gastrointestinal: Negative for nausea and vomiting.  Genitourinary: Negative for frequency.  Musculoskeletal:       Negative muscle weakness  Endo/Heme/Allergies: Negative for polydipsia.       Positive polyphagia Negative hypoglycemia    PHYSICAL EXAM: Blood pressure 94/63, pulse (!) 59, temperature 98.3 F (36.8 C), temperature source Oral, height 5\' 5"  (1.651 m), weight 235 lb (106.6 kg), SpO2 95 %. Body mass index is 39.11 kg/m. Physical Exam  Constitutional: He is oriented to person, place, and time. He appears well-developed and well-nourished.  Cardiovascular:  Bradycardic  Pulmonary/Chest: Effort normal.  Musculoskeletal: Normal range of motion.  Neurological: He is oriented to person, place, and time.  Skin: Skin is warm and dry.  Psychiatric: He has a normal mood and affect. His  behavior is normal.  Vitals reviewed.   RECENT LABS AND TESTS: BMET    Component Value Date/Time   NA 141 06/17/2017 0900   K 3.3 (L) 06/17/2017 0900   CL 96 06/17/2017 0900   CO2 29 06/17/2017 0900   GLUCOSE 87 06/17/2017 0900   GLUCOSE 123 (H) 11/07/2015 0901   BUN 11 06/17/2017 0900   CREATININE 0.76 06/17/2017 0900   CREATININE 0.89 09/08/2013 1631   CALCIUM 9.4 06/17/2017 0900   GFRNONAA 103 06/17/2017 0900   GFRAA 120 06/17/2017 0900   Lab Results  Component Value Date   HGBA1C 5.3 06/17/2017   HGBA1C 5.9 (H) 03/04/2017   HGBA1C 5.8 11/07/2015   Lab Results  Component Value Date   INSULIN 9.7 06/17/2017   INSULIN 16.2 03/04/2017   CBC    Component Value Date/Time   WBC 8.5 03/04/2017 1145   WBC 8.8 11/07/2015 0901   RBC 5.18 03/04/2017 1145   RBC 5.54 11/07/2015 0901   HGB 14.3 03/04/2017 1145   HCT 42.9 03/04/2017 1145   PLT 348 03/04/2017 1145   MCV 83 03/04/2017 1145   MCH 27.6 03/04/2017 1145   MCH 27.6 09/08/2013 1631   MCHC 33.3 03/04/2017 1145   MCHC 33.1 11/07/2015 0901   RDW 14.1 03/04/2017 1145   LYMPHSABS 2.4 03/04/2017 1145   MONOABS 0.5 11/07/2015 0901   EOSABS 0.2 03/04/2017 1145   BASOSABS 0.0 03/04/2017 1145   Iron/TIBC/Ferritin/ %Sat No results found for: IRON, TIBC, FERRITIN, IRONPCTSAT Lipid Panel     Component Value Date/Time   CHOL 188 06/17/2017 0900   TRIG 96 06/17/2017 0900   HDL 35 (L) 06/17/2017 0900   CHOLHDL 6 11/07/2015 0901   VLDL 36.6 11/07/2015 0901   LDLCALC 134 (H) 06/17/2017 0900   Hepatic Function Panel     Component Value Date/Time   PROT 7.3 06/17/2017 0900   ALBUMIN 3.9 06/17/2017 0900   AST 45 (H) 06/17/2017 0900   ALT 47 (H) 06/17/2017 0900   ALKPHOS 69 06/17/2017 0900   BILITOT 0.5 06/17/2017 0900   BILIDIR 0.1 11/07/2015 0901   IBILI 0.3 09/08/2013 1631      Component Value Date/Time   TSH 0.735 03/04/2017 1145   TSH 0.96 11/07/2015 0901   TSH 0.769 09/08/2013 1631    ASSESSMENT AND  PLAN: Vitamin D deficiency - Plan: Vitamin D, Ergocalciferol, (DRISDOL) 50000 units CAPS capsule  Prediabetes - Plan: metFORMIN (GLUCOPHAGE) 500 MG tablet  At risk for diabetes mellitus  Class 3 severe obesity with serious comorbidity and body mass index (BMI) of 45.0 to 49.9 in adult, unspecified obesity  type [facility])  PLAN:  Vitamin D Deficiency Mukesh was informed that low vitamin D levels contributes to fatigue and are associated with obesity, breast, and colon cancer. He agrees to continue to take prescription Vit D @50 ,000 IU every week, we will refill for 1 month and will follow up for routine testing of vitamin D, at least 2-3 times per year. He was informed of the risk of over-replacement of vitamin D and agrees to not increase his dose unless he discusses this with [name] first. Karsten agrees to follow up with our clinic in 3 weeks.  Pre-Diabetes Zaydin will continue to work on weight loss, exercise, and decreasing simple carbohydrates in his diet to help decrease the risk of diabetes. We dicussed metformin including benefits and risks. He was informed that eating too many simple carbohydrates or too many calories at one sitting increases the likelihood of GI side effects. Malcomb agrees to increase metformin to 500 mg bid #60 with no refills and will follow up with [name] as directed to monitor his progress.  Diabetes risk counseling Franklyn was given extended (15 minutes) diabetes prevention counseling today. He is 56 y.o. male and has risk factors for diabetes including obesity and pre-diabetes. We discussed intensive lifestyle modifications today with an emphasis on weight loss as well as increasing exercise and decreasing simple carbohydrates in his diet.  Obesity Doyl is currently in the action stage of change. As such, his goal is to continue with weight loss efforts He has agreed to follow a lower carbohydrate, vegetable and lean protein rich diet plan daily. Eliya has been  instructed to work up to a goal of 150 minutes of combined cardio and strengthening exercise per week for weight loss and overall health benefits. We discussed the following Behavioral Modification Strategies today: increasing lean protein intake and keeping healthy foods in the home  Coreon has agreed to follow up with our clinic in 3 weeks. He was informed of the importance of frequent follow up visits to maximize his success with intensive lifestyle modifications for his multiple health conditions.  I, Doreene Nest, am acting as transcriptionist for Lacy Duverney, PA-C  I have reviewed the above documentation for accuracy and completeness, and I agree with the above. -Lacy Duverney, PA-C  I have reviewed the above note and agree with the plan. -Dennard Nip, MD  OBESITY BEHAVIORAL INTERVENTION VISIT  Today's visit was # 12 out of 22.  Starting weight: 294 lbs Starting date: 03/04/17 Today's weight : 235 lbs Today's date: 09/27/2017 Total lbs lost to date: 55 (Patients must lose 7 lbs in the first 6 months to continue with counseling)   ASK: We discussed the diagnosis of obesity with [name] today and Juanda Crumble agreed to give [name] permission to discuss obesity behavioral modification therapy today.  ASSESS: Kannon has the diagnosis of obesity and his BMI today is 39.11 Eual is in the action stage of change   ADVISE: Jerrold was educated on the multiple health risks of obesity as well as the benefit of weight loss to improve his health. He was advised of the need for long term treatment and the importance of lifestyle modifications.  AGREE: Multiple dietary modification options and treatment options were discussed and  Carthel agreed to keep a food journal with 140 to 1550 calories and 100+ grams of protein daily We discussed the following Behavioral Modification Strategies today: increasing lean protein intake and keeping healthy foods in the home

## 2017-10-05 ENCOUNTER — Ambulatory Visit (INDEPENDENT_AMBULATORY_CARE_PROVIDER_SITE_OTHER): Payer: Managed Care, Other (non HMO) | Admitting: Neurology

## 2017-10-05 ENCOUNTER — Encounter: Payer: Self-pay | Admitting: Neurology

## 2017-10-05 VITALS — BP 111/66 | HR 65 | Ht 66.5 in | Wt 247.6 lb

## 2017-10-05 DIAGNOSIS — R29898 Other symptoms and signs involving the musculoskeletal system: Secondary | ICD-10-CM

## 2017-10-05 DIAGNOSIS — M5416 Radiculopathy, lumbar region: Secondary | ICD-10-CM

## 2017-10-05 DIAGNOSIS — R200 Anesthesia of skin: Secondary | ICD-10-CM

## 2017-10-05 DIAGNOSIS — M5417 Radiculopathy, lumbosacral region: Secondary | ICD-10-CM

## 2017-10-05 NOTE — Patient Instructions (Signed)
Lumbosacral Radiculopathy Lumbosacral radiculopathy is a condition that involves the spinal nerves and nerve roots in the low back and bottom of the spine. The condition develops when these nerves and nerve roots move out of place or become inflamed and cause symptoms. What are the causes? This condition may be caused by:  Pressure from a disk that bulges out of place (herniated disk). A disk is a plate of cartilage that separates bones in the spine.  Disk degeneration.  A narrowing of the bones of the lower back (spinal stenosis).  A tumor.  An infection.  An injury that places sudden pressure on the disks that cushion the bones of your lower spine.  What increases the risk? This condition is more likely to develop in:  Males aged 30-50 years.  Females aged 50-60 years.  People who lift improperly.  People who are overweight or live a sedentary lifestyle.  People who smoke.  People who perform repetitive activities that strain the spine.  What are the signs or symptoms? Symptoms of this condition include:  Pain that goes down from the back into the legs (sciatica). This is the most common symptom. The pain may be worse with sitting, coughing, or sneezing.  Pain and numbness in the arms and legs.  Muscle weakness.  Tingling.  Loss of bladder control or bowel control.  How is this diagnosed? This condition is diagnosed with a physical exam and medical history. If the pain is lasting, you may have tests, such as:  MRI scan.  X-ray.  CT scan.  Myelogram.  Nerve conduction study.  How is this treated? This condition is often treated with:  Hot packs and ice applied to affected areas.  Stretches to improve flexibility.  Exercises to strengthen back muscles.  Physical therapy.  Pain medicine.  A steroid injection in the spine.  In some cases, no treatment is needed. If the condition is long-lasting (chronic), or if symptoms are severe, treatment may  involve surgery or lifestyle changes, such as following a weight loss plan. Follow these instructions at home: Medicines  Take medicines only as directed by your health care provider.  Do not drive or operate heavy machinery while taking pain medicine. Injury care  Apply a heat pack to the injured area as directed by your health care provider.  Apply ice to the affected area: ? Put ice in a plastic bag. ? Place a towel between your skin and the bag. ? Leave the ice on for 20-30 minutes, every 2 hours while you are awake or as needed. Or, leave the ice on for as long as directed by your health care provider. Other Instructions  If you were shown how to do any exercises or stretches, do them as directed by your health care provider.  If your health care provider prescribed a diet or exercise program, follow it as directed.  Keep all follow-up visits as directed by your health care provider. This is important. Contact a health care provider if:  Your pain does not improve over time even when taking pain medicines. Get help right away if:  Your develop severe pain.  Your pain suddenly gets worse.  You develop increasing weakness in your legs.  You lose the ability to control your bladder or bowel.  You have difficulty walking or balancing.  You have a fever. This information is not intended to replace advice given to you by your health care provider. Make sure you discuss any questions you have with your   health care provider. Document Released: 12/14/2005 Document Revised: 05/21/2016 Document Reviewed: 12/10/2014 [employer] Interactive Patient Education  2018 [employer]  

## 2017-10-05 NOTE — Progress Notes (Signed)
[facility]    Provider:  Dr Jaynee Eagles Referring Provider: Midge Minium, MD Primary Care Physician:  Midge Minium, MD  CC:  Right leg pain  HPI:  Bryan Lee is a 56 y.o. male here as a referral from Dr. Birdie Riddle for right leg pain. Started several years ago. He has tried conservative therapy, OTC meds, physical therapy and has even lost 70 pounds to try to make it better. Lost 70 pounds later and still having symptoms. The numbness starts in the low back and buttocks on the right and radiates down the right thigh not below the knee. No significant low back pain but prominent numbness in the right antero-lateral thigh. Started 2.5 years ago. He describes weakness of the right and difficulty walking. No falls. No numbness or paresthesias in the feet or hands. Stable but not improving. No inciting event that he remembers. Worse with walking, only occ when sitting or laying, better with laying down but has happened at that time as well and changing positions help. Very disturbing but not significantly painful. No other focal neurologic deficits, associated symptoms, inciting events or modifiable factors.  Reviewed notes, labs and imaging from outside physicians, which showed:   HIV negative, hgba1c 5.3, vitamin D low normal.   Review of Systems: Patient complains of symptoms per HPI as well as the following symptoms: numbness, weakness. Pertinent negatives and positives per HPI. All others negative.   Social History   Social History  . Marital status: Married    Spouse name: Rosemarie Ax  . Number of children: 2  . Years of education: Master's Degree   Occupational History  . VP Operations     AT [employer]   Social History Main Topics  . Smoking status: Never Smoker  . Smokeless tobacco: Never Used  . Alcohol use Yes     Comment: social  . Drug use: No  . Sexual activity: Yes    Partners: Female    Birth control/ protection: Surgical   Other Topics  Concern  . Not on file   Social History Narrative   Lives with his wife and children.    Family History  Problem Relation Age of Onset  . Coronary artery disease Father   . Heart failure Father   . Hypertension Father   . Sleep apnea Father   . Heart disease Father   . Arthritis Mother   . Obesity Mother   . Diabetes Unknown        grandmother  . Cancer Sister        GYN-type cancer  . GER disease Daughter   . GER disease Son   . Diabetes Maternal Grandmother   . Cancer Maternal Grandmother        ovarian  . Cancer Paternal Grandmother        lung cancer, + tobacco and colon  . Seizures Sister     Past Medical History:  Diagnosis Date  . Achilles rupture   . Acid reflux   . Back pain   . Carpal tunnel syndrome   . Chest pain   . Constipation   . Dizzy spells   . Joint pain   . Lactose intolerance   . Lipoma   . Numbness    legs, hands  . Obesity   . Plantar fasciitis   . Shoulder pain    rotator cuff  . SOB (shortness of breath) on exertion     Past Surgical History:  Procedure Laterality Date  .  ACHILLES TENDON REPAIR     left  . HERNIA REPAIR    . lump removal    . VASECTOMY      Current Outpatient Prescriptions  Medication Sig Dispense Refill  . metFORMIN (GLUCOPHAGE) 500 MG tablet Take 1 tablet (500 mg total) by mouth 2 (two) times daily with a meal. 60 tablet 0  . Naproxen Sodium (ALEVE) 220 MG CAPS Take by mouth as needed.    Marland Kitchen omega-3 acid ethyl esters (LOVAZA) 1 g capsule Take 2 g by mouth 2 (two) times daily.    . TURMERIC PO Take 2 capsules by mouth daily.    . Vitamin D, Ergocalciferol, (DRISDOL) 50000 units CAPS capsule Take 1 capsule (50,000 Units total) by mouth every 7 (seven) days. 4 capsule 0   No current facility-administered medications for this visit.     Allergies as of 10/05/2017 - Review Complete 09/27/2017  Allergen Reaction Noted  . Lactose intolerance (gi) Other (See Comments) 03/04/2017    Vitals: BP 111/66    Pulse 65   Ht 5' 6.5" (1.689 m)   Wt 247 lb 9.6 oz (112.3 kg)   BMI 39.36 kg/m  Last Weight:  Wt Readings from Last 1 Encounters:  10/05/17 247 lb 9.6 oz (112.3 kg)   Last Height:   Ht Readings from Last 1 Encounters:  10/05/17 5' 6.5" (1.689 m)   Physical exam: Exam: Gen: NAD, conversant, well nourised, obese, well groomed                     CV: RRR, no MRG. No Carotid Bruits. No peripheral edema, warm, nontender Eyes: Conjunctivae clear without exudates or hemorrhage  Neuro: Detailed Neurologic Exam  Speech:    Speech is normal; fluent and spontaneous with normal comprehension.  Cognition:    The patient is oriented to person, place, and time;     recent and remote memory intact;     language fluent;     normal attention, concentration,     fund of knowledge Cranial Nerves:    The pupils are equal, round, and reactive to light. The fundi are normal and spontaneous venous pulsations are present. Visual fields are full to finger confrontation. Extraocular movements are intact. Trigeminal sensation is intact and the muscles of mastication are normal. The face is symmetric. The palate elevates in the midline. Hearing intact. Voice is normal. Shoulder shrug is normal. The tongue has normal motion without fasciculations.   Coordination:    Normal finger to nose and heel to shin. Normal rapid alternating movements.   Gait:    Heel-toe and tandem gait are normal.   Motor Observation:    No asymmetry, no atrophy, and no involuntary movements noted. Tone:    Normal muscle tone.    Posture:    Posture is normal. normal erect    Strength:    Strength is V/V in the upper and lower limbs.      Sensation: intact to LT     Reflex Exam:  DTR's:    Deep tendon reflexes in the upper and lower extremities are normal bilaterally.   Toes:    The toes are downgoing bilaterally.   Clonus:    Clonus is absent.    Assessment/Plan:  Male with several years of right leg proximal  numbness and subjective weakness. Has failed conservative treatment and has also lost 70+ pounds without relief.  DDx includes meralgia paresthetica, lumbar radiculopathy, plexopathy  MRI lumbar spine to evaluate for  lumbar radiculopathy For evaluation of surgical procedures or epidural steroid injections. EMG/NCS of right and left lower extremities to include femoral motor, saphenous sensory and femoral cutaneous if possible.  Orders Placed This Encounter  Procedures  . MR LUMBAR SPINE WO CONTRAST  . NCV with EMG(electromyography)   Cc: Dr. Floy Sabina, MD  [facility] [address] [place], [place] [address]  Phone [phone] Fax [phone]

## 2017-10-07 ENCOUNTER — Encounter (INDEPENDENT_AMBULATORY_CARE_PROVIDER_SITE_OTHER): Payer: Self-pay | Admitting: Physician Assistant

## 2017-10-13 ENCOUNTER — Ambulatory Visit (INDEPENDENT_AMBULATORY_CARE_PROVIDER_SITE_OTHER): Payer: Managed Care, Other (non HMO) | Admitting: Neurology

## 2017-10-13 ENCOUNTER — Ambulatory Visit (INDEPENDENT_AMBULATORY_CARE_PROVIDER_SITE_OTHER): Payer: Self-pay | Admitting: Neurology

## 2017-10-13 DIAGNOSIS — R202 Paresthesia of skin: Secondary | ICD-10-CM

## 2017-10-13 DIAGNOSIS — M5416 Radiculopathy, lumbar region: Secondary | ICD-10-CM | POA: Diagnosis not present

## 2017-10-13 DIAGNOSIS — Z0289 Encounter for other administrative examinations: Secondary | ICD-10-CM

## 2017-10-13 NOTE — Progress Notes (Signed)
See procedure note.

## 2017-10-17 NOTE — Progress Notes (Signed)
Full Name: Bryan Lee Gender: Male MRN #: [id number] Date of Birth: 1961-11-02    Visit Date: 10/13/2017 08:56 Age: 56 Years 49 Months Old Examining Physician: Sarina Ill, MD  Referring Physician: Jaynee Eagles, MD  History: Patient with right anterolateral thigh numbness.  Summary: The right Lateral Femoral Cutaneous nerve showed no response.    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 No evidence for polyneuropathy or radiculopathy.  Sarina Ill M.D.  [facility] [place], [place] [address] Tel: [phone] Fax: [phone]        [facility]    Nerve / Sites Muscle Latency Ref. Amplitude Ref. Rel Amp Segments Distance Velocity Ref. Area    ms ms mV mV %  cm m/s m/s mVms  L Peroneal - EDB     Ankle EDB 4.2 ?6.5 8.2 ?2.0 100 Ankle - EDB 9   19.8     Fib head EDB 10.1  7.7  93 Fib head - Ankle 30 51 ?44 18.9     Pop fossa EDB 12.5  7.5  97.3 Pop fossa - Fib head 12 49 ?44 18.8         Pop fossa - Ankle      R Peroneal - EDB     Ankle EDB 4.5 ?6.5 8.8 ?2.0 100 Ankle - EDB 9   24.0     Fib head EDB 10.5  7.7  87.1 Fib head - Ankle 30 51 ?44 22.3     Pop fossa EDB 12.8  7.4  96.7 Pop fossa - Fib head 12 51 ?44 21.5         Pop fossa - Ankle      R Tibial - AH     Ankle AH 4.4 ?5.8 11.7 ?4.0 100 Ankle - AH 9   21.1     Pop fossa AH 12.3  7.4  63.3 Pop fossa - Ankle 34 43 ?41 18.4  L Tibial - AH     Ankle AH 4.2 ?5.8 11.2 ?4.0 100 Ankle - AH 9   26.1     Pop fossa AH 12.0  10.8  96.6 Pop fossa - Ankle 34 44 ?41 25.4  R Femoral - Vastus Med     B. Ing Lig Vastus Med 6.1  10.2  100 B. Ing Lig - Vastus Med    76.2  L Femoral - Vastus Med     B. Ing Lig Vastus Med 5.8  10.2  100 B. Ing Lig - Vastus Med    75.7                 SNC    Nerve / Sites Rec. Site Peak Lat Ref.  Amp Ref. Segments Distance    ms ms V V  cm  R Sural - Ankle (Calf)     Calf Ankle 3.8 ?4.4 13 ?6 Calf - Ankle 14  L Sural - Ankle (Calf)   Calf Ankle 3.9 ?4.4 15 ?6 Calf - Ankle 14  R Superficial peroneal - Ankle     Lat leg Ankle 4.0 ?4.4 11 ?6 Lat leg - Ankle 14  L Superficial peroneal - Ankle     Lat leg Ankle 3.9 ?4.4 13 ?6 Lat leg - Ankle 14  L Lateral femoral cutaneous - Thigh (Inguinal ligament)     A. Ing ligament Thigh 5.9  4  A. Ing ligament - Thigh   R Lateral femoral cutaneous - Thigh (Inguinal  ligament)     A. Ing ligament Thigh NR  NR  A. Ing ligament - Thigh   R Saphenous - Ankle (Medial leg)     Medial leg Ankle 4.1 ?4.2 4 ?4 Medial leg - Ankle 14  L Saphenous - Ankle (Medial leg)     Medial leg Ankle 4.2 ?4.2 4 ?4 Medial leg - Ankle 14                     F  Wave    Nerve F Lat Ref.   ms ms  R Tibial - AH 46.1 ?56.0  L Tibial - AH 46.4 ?56.0         EMG full       EMG Summary Table    Spontaneous MUAP Recruitment  Muscle IA Fib PSW Fasc Other Amp Dur. Poly Pattern  R. Vastus medialis Normal None None None _______ Normal Normal Normal Normal  R. Gastrocnemius (Medial head) Normal None None None _______ Normal Normal Normal Normal  R. Tibialis anterior Normal None None None _______ Normal Normal Normal Normal  R. Biceps femoris (long head) Normal None None None _______ Normal Normal Normal Normal  R. Gluteus maximus Normal None None None _______ Normal Normal Normal Normal  R. Gluteus medius Normal None None None _______ Normal Normal Normal Normal  R. Lumbar paraspinals (mid) Normal None None None _______ Normal Normal Normal Normal

## 2017-10-17 NOTE — Procedures (Signed)
Full Name: Bryan Lee Gender: Male MRN #: [id number] Date of Birth: November 22, 1961    Visit Date: 10/13/2017 08:56 Age: 56 Years 76 Months Old Examining Physician: Sarina Ill, MD  Referring Physician: Jaynee Eagles, MD  History: Patient with right anterolateral thigh numbness.  Summary: The right Lateral Femoral Cutaneous nerve showed no response.    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 No evidence for polyneuropathy or radiculopathy.  Sarina Ill M.D.  [facility] [place], [place] [address] Tel: [phone] Fax: [phone]        [facility]    Nerve / Sites Muscle Latency Ref. Amplitude Ref. Rel Amp Segments Distance Velocity Ref. Area    ms ms mV mV %  cm m/s m/s mVms  L Peroneal - EDB     Ankle EDB 4.2 ?6.5 8.2 ?2.0 100 Ankle - EDB 9   19.8     Fib head EDB 10.1  7.7  93 Fib head - Ankle 30 51 ?44 18.9     Pop fossa EDB 12.5  7.5  97.3 Pop fossa - Fib head 12 49 ?44 18.8         Pop fossa - Ankle      R Peroneal - EDB     Ankle EDB 4.5 ?6.5 8.8 ?2.0 100 Ankle - EDB 9   24.0     Fib head EDB 10.5  7.7  87.1 Fib head - Ankle 30 51 ?44 22.3     Pop fossa EDB 12.8  7.4  96.7 Pop fossa - Fib head 12 51 ?44 21.5         Pop fossa - Ankle      R Tibial - AH     Ankle AH 4.4 ?5.8 11.7 ?4.0 100 Ankle - AH 9   21.1     Pop fossa AH 12.3  7.4  63.3 Pop fossa - Ankle 34 43 ?41 18.4  L Tibial - AH     Ankle AH 4.2 ?5.8 11.2 ?4.0 100 Ankle - AH 9   26.1     Pop fossa AH 12.0  10.8  96.6 Pop fossa - Ankle 34 44 ?41 25.4  R Femoral - Vastus Med     B. Ing Lig Vastus Med 6.1  10.2  100 B. Ing Lig - Vastus Med    76.2  L Femoral - Vastus Med     B. Ing Lig Vastus Med 5.8  10.2  100 B. Ing Lig - Vastus Med    75.7                 SNC    Nerve / Sites Rec. Site Peak Lat Ref.  Amp Ref. Segments Distance    ms ms V V  cm  R Sural - Ankle (Calf)     Calf Ankle 3.8 ?4.4 13 ?6 Calf - Ankle 14  L Sural - Ankle (Calf)   Calf Ankle 3.9 ?4.4 15 ?6 Calf - Ankle 14  R Superficial peroneal - Ankle     Lat leg Ankle 4.0 ?4.4 11 ?6 Lat leg - Ankle 14  L Superficial peroneal - Ankle     Lat leg Ankle 3.9 ?4.4 13 ?6 Lat leg - Ankle 14  L Lateral femoral cutaneous - Thigh (Inguinal ligament)     A. Ing ligament Thigh 5.9  4  A. Ing ligament - Thigh   R Lateral femoral cutaneous - Thigh (Inguinal  ligament)     A. Ing ligament Thigh NR  NR  A. Ing ligament - Thigh   R Saphenous - Ankle (Medial leg)     Medial leg Ankle 4.1 ?4.2 4 ?4 Medial leg - Ankle 14  L Saphenous - Ankle (Medial leg)     Medial leg Ankle 4.2 ?4.2 4 ?4 Medial leg - Ankle 14                     F  Wave    Nerve F Lat Ref.   ms ms  R Tibial - AH 46.1 ?56.0  L Tibial - AH 46.4 ?56.0         EMG full       EMG Summary Table    Spontaneous MUAP Recruitment  Muscle IA Fib PSW Fasc Other Amp Dur. Poly Pattern  R. Vastus medialis Normal None None None _______ Normal Normal Normal Normal  R. Gastrocnemius (Medial head) Normal None None None _______ Normal Normal Normal Normal  R. Tibialis anterior Normal None None None _______ Normal Normal Normal Normal  R. Biceps femoris (long head) Normal None None None _______ Normal Normal Normal Normal  R. Gluteus maximus Normal None None None _______ Normal Normal Normal Normal  R. Gluteus medius Normal None None None _______ Normal Normal Normal Normal  R. Lumbar paraspinals (mid) Normal None None None _______ Normal Normal Normal Normal

## 2017-10-18 ENCOUNTER — Ambulatory Visit (INDEPENDENT_AMBULATORY_CARE_PROVIDER_SITE_OTHER): Payer: Managed Care, Other (non HMO) | Admitting: Physician Assistant

## 2017-10-18 VITALS — BP 121/76 | HR 55 | Temp 98.0°F | Ht 65.0 in | Wt 236.0 lb

## 2017-10-18 DIAGNOSIS — E782 Mixed hyperlipidemia: Secondary | ICD-10-CM

## 2017-10-18 DIAGNOSIS — Z9189 Other specified personal risk factors, not elsewhere classified: Secondary | ICD-10-CM

## 2017-10-18 DIAGNOSIS — Z6839 Body mass index (BMI) 39.0-39.9, adult: Secondary | ICD-10-CM | POA: Diagnosis not present

## 2017-10-18 DIAGNOSIS — R7303 Prediabetes: Secondary | ICD-10-CM

## 2017-10-18 DIAGNOSIS — E559 Vitamin D deficiency, unspecified: Secondary | ICD-10-CM | POA: Diagnosis not present

## 2017-10-18 MED ORDER — VITAMIN D (ERGOCALCIFEROL) 1.25 MG (50000 UNIT) PO CAPS: 50000.0000 [IU] | ORAL_CAPSULE | ORAL | 0 refills | Status: DC

## 2017-10-18 MED ORDER — METFORMIN HCL 500 MG PO TABS: 500.0000 mg | ORAL_TABLET | Freq: Two times a day (BID) | ORAL | 0 refills | Status: DC

## 2017-10-18 NOTE — Progress Notes (Signed)
Office: [phone]  /  Fax: [phone]   HPI:   Chief Complaint: OBESITY Bryan Lee is here to discuss his progress with his obesity treatment plan. He is on the keep a food journal with 1450 to 1550 calories and 95+ grams of protein daily and is following his eating plan approximately 100 % of the time. He states he is exercising 0 minutes 0 times per week. Bryan Lee was on the low carbohydrate plan and tolerated it well, but states he is frustrated that he did not lose weight. He is retaining fluids. Bryan Lee would like to add variety to his meals. His weight is 236 lb (107 kg) today and has had a weight gain of 1 pound over a period of 3 weeks since his last visit. He has lost 58 lbs since starting treatment with [name].  Vitamin D deficiency Bryan Lee has a diagnosis of vitamin D deficiency. He is currently taking vit D and denies nausea, vomiting or muscle weakness.  Mixed Hyperlipidemia Bryan Lee has mixed hyperlipidemia and is not on a statin and he declines medications today. He has been trying to improve his cholesterol levels with intensive lifestyle modification including a low saturated fat diet, exercise and weight loss. He denies any chest pain, claudication or myalgias.  Pre-Diabetes Bryan Lee has a diagnosis of pre-diabetes based on his elevated Hgb A1c and was informed this puts him at greater risk of developing diabetes. He is taking metformin currently and continues to work on diet and exercise to decrease risk of diabetes. He denies polyphagia, nausea or hypoglycemia.  At risk for diabetes Bryan Lee is at higher than average risk for developing diabetes due to his obesity and pre-diabetes. He currently denies polyuria or polydipsia.   ALLERGIES: Allergies  Allergen Reactions  . Lactose Intolerance (Gi) Other (See Comments)    Cramping and gas pain    MEDICATIONS: Current Outpatient Prescriptions on File Prior to Visit  Medication Sig Dispense Refill  . metFORMIN  (GLUCOPHAGE) 500 MG tablet Take 1 tablet (500 mg total) by mouth 2 (two) times daily with a meal. 60 tablet 0  . Naproxen Sodium (ALEVE) 220 MG CAPS Take by mouth as needed.    Marland Kitchen omega-3 acid ethyl esters (LOVAZA) 1 g capsule Take 2 g by mouth 2 (two) times daily.    . TURMERIC PO Take 2 capsules by mouth daily.    . Vitamin D, Ergocalciferol, (DRISDOL) 50000 units CAPS capsule Take 1 capsule (50,000 Units total) by mouth every 7 (seven) days. 4 capsule 0   No current facility-administered medications on file prior to visit.     PAST MEDICAL HISTORY: Past Medical History:  Diagnosis Date  . Achilles rupture   . Acid reflux   . Back pain   . Carpal tunnel syndrome   . Chest pain   . Constipation   . Dizzy spells   . Joint pain   . Lactose intolerance   . Lipoma   . Numbness    legs, hands  . Obesity   . Plantar fasciitis   . Shoulder pain    rotator cuff  . SOB (shortness of breath) on exertion     PAST SURGICAL HISTORY: Past Surgical History:  Procedure Laterality Date  . ACHILLES TENDON REPAIR     left  . HERNIA REPAIR    . lump removal    . VASECTOMY      SOCIAL HISTORY: Social History  Substance Use Topics  . Smoking status: Never Smoker  . Smokeless tobacco:  Never Used  . Alcohol use Yes     Comment: social    FAMILY HISTORY: Family History  Problem Relation Age of Onset  . Coronary artery disease Father   . Heart failure Father   . Hypertension Father   . Sleep apnea Father   . Heart disease Father   . Arthritis Mother   . Obesity Mother   . Diabetes Unknown        grandmother  . Cancer Sister        GYN-type cancer  . GER disease Daughter   . GER disease Son   . Diabetes Maternal Grandmother   . Cancer Maternal Grandmother        ovarian  . Cancer Paternal Grandmother        lung cancer, + tobacco and colon  . Seizures Sister     ROS: Review of Systems  Constitutional: Negative for weight loss.  Cardiovascular: Negative for chest pain  and claudication.  Gastrointestinal: Negative for nausea and vomiting.  Genitourinary: Negative for frequency.  Musculoskeletal: Negative for myalgias.       Negative muscle weakness  Endo/Heme/Allergies: Negative for polydipsia.       Negative polyphagia Negative hypoglycemia    PHYSICAL EXAM: Blood pressure 121/76, pulse (!) 55, temperature 98 F (36.7 C), temperature source Oral, height 5\' 5"  (1.651 m), weight 236 lb (107 kg), SpO2 98 %. Body mass index is 39.27 kg/m. Physical Exam  Constitutional: He is oriented to person, place, and time. He appears well-developed and well-nourished.  Cardiovascular:  Bradycardic  Pulmonary/Chest: Effort normal.  Musculoskeletal: Normal range of motion.  Neurological: He is oriented to person, place, and time.  Skin: Skin is warm and dry.  Psychiatric: He has a normal mood and affect. His behavior is normal.  Vitals reviewed.   RECENT LABS AND TESTS: BMET    Component Value Date/Time   NA 141 06/17/2017 0900   K 3.3 (L) 06/17/2017 0900   CL 96 06/17/2017 0900   CO2 29 06/17/2017 0900   GLUCOSE 87 06/17/2017 0900   GLUCOSE 123 (H) 11/07/2015 0901   BUN 11 06/17/2017 0900   CREATININE 0.76 06/17/2017 0900   CREATININE 0.89 09/08/2013 1631   CALCIUM 9.4 06/17/2017 0900   GFRNONAA 103 06/17/2017 0900   GFRAA 120 06/17/2017 0900   Lab Results  Component Value Date   HGBA1C 5.3 06/17/2017   HGBA1C 5.9 (H) 03/04/2017   HGBA1C 5.8 11/07/2015   Lab Results  Component Value Date   INSULIN 9.7 06/17/2017   INSULIN 16.2 03/04/2017   CBC    Component Value Date/Time   WBC 8.5 03/04/2017 1145   WBC 8.8 11/07/2015 0901   RBC 5.18 03/04/2017 1145   RBC 5.54 11/07/2015 0901   HGB 14.3 03/04/2017 1145   HCT 42.9 03/04/2017 1145   PLT 348 03/04/2017 1145   MCV 83 03/04/2017 1145   MCH 27.6 03/04/2017 1145   MCH 27.6 09/08/2013 1631   MCHC 33.3 03/04/2017 1145   MCHC 33.1 11/07/2015 0901   RDW 14.1 03/04/2017 1145   LYMPHSABS  2.4 03/04/2017 1145   MONOABS 0.5 11/07/2015 0901   EOSABS 0.2 03/04/2017 1145   BASOSABS 0.0 03/04/2017 1145   Iron/TIBC/Ferritin/ %Sat No results found for: IRON, TIBC, FERRITIN, IRONPCTSAT Lipid Panel     Component Value Date/Time   CHOL 188 06/17/2017 0900   TRIG 96 06/17/2017 0900   HDL 35 (L) 06/17/2017 0900   CHOLHDL 6 11/07/2015 0901   VLDL  36.6 11/07/2015 0901   LDLCALC 134 (H) 06/17/2017 0900   Hepatic Function Panel     Component Value Date/Time   PROT 7.3 06/17/2017 0900   ALBUMIN 3.9 06/17/2017 0900   AST 45 (H) 06/17/2017 0900   ALT 47 (H) 06/17/2017 0900   ALKPHOS 69 06/17/2017 0900   BILITOT 0.5 06/17/2017 0900   BILIDIR 0.1 11/07/2015 0901   IBILI 0.3 09/08/2013 1631      Component Value Date/Time   TSH 0.735 03/04/2017 1145   TSH 0.96 11/07/2015 0901   TSH 0.769 09/08/2013 1631    ASSESSMENT AND PLAN: Mixed hyperlipidemia - Plan: Lipid Panel With LDL/HDL Ratio  Vitamin D deficiency - Plan: VITAMIN D 25 Hydroxy (Vit-D Deficiency, Fractures), Vitamin D, Ergocalciferol, (DRISDOL) 50000 units CAPS capsule  Prediabetes - Plan: Comprehensive metabolic panel, Hemoglobin A1c, Insulin, random, metFORMIN (GLUCOPHAGE) 500 MG tablet  At risk for diabetes mellitus  Class 2 severe obesity with serious comorbidity and body mass index (BMI) of 39.0 to 39.9 in adult, unspecified obesity type ([place])  PLAN:  Vitamin D Deficiency Bryan Lee was informed that low vitamin D levels contributes to fatigue and are associated with obesity, breast, and colon cancer. He agrees to continue to take prescription Vit D @50 ,000 IU every week #4 with no refills and will follow up for routine testing of vitamin D, at least 2-3 times per year. He was informed of the risk of over-replacement of vitamin D and agrees to not increase his dose unless he discusses this with [name] first. Bryan Lee agrees to follow up with our clinic in 4 weeks.  Hyperlipidemia Bryan Lee was informed of the American  Heart Association Guidelines emphasizing intensive lifestyle modifications as the first line treatment for hyperlipidemia. We discussed many lifestyle modifications today in depth, and Bryan Lee will continue to work on decreasing saturated fats such as fatty red meat, butter and many fried foods. He will also increase vegetables and lean protein in his diet and continue to work on exercise and weight loss efforts.  Pre-Diabetes Bryan Lee will continue to work on weight loss, exercise, and decreasing simple carbohydrates in his diet to help decrease the risk of diabetes. We dicussed metformin including benefits and risks. He was informed that eating too many simple carbohydrates or too many calories at one sitting increases the likelihood of GI side effects. Bryan Lee agrees to continue metformin for now and a prescription was written today for metformin 500 mg bid #60 with no refills. We will check labs and Bryan Lee agreed to follow up with [name] as directed to monitor his progress.  Diabetes risk counseling Bryan Lee was given extended (15 minutes) diabetes prevention counseling today. He is 56 y.o. male and has risk factors for diabetes including obesity and pre-diabetes. We discussed intensive lifestyle modifications today with an emphasis on weight loss as well as increasing exercise and decreasing simple carbohydrates in his diet.  Obesity Bryan Lee is currently in the action stage of change. As such, his goal is to continue with weight loss efforts He has agreed to keep a food journal with 1450 to 1550 calories and 95+ grams of protein daily Bryan Lee has been instructed to work up to a goal of 150 minutes of combined cardio and strengthening exercise per week for weight loss and overall health benefits. We discussed the following Behavioral Modification Strategies today: increasing lean protein intake and work on meal planning and easy cooking plans  Bryan Lee has agreed to follow up with our clinic in 4 weeks.  He was  informed of the importance of frequent follow up visits to maximize his success with intensive lifestyle modifications for his multiple health conditions.  I, Doreene Nest, am acting as transcriptionist for Lacy Duverney, PA-C  I have reviewed the above documentation for accuracy and completeness, and I agree with the above. -Lacy Duverney, PA-C  I have reviewed the above note and agree with the plan. -Dennard Nip, MD   OBESITY BEHAVIORAL INTERVENTION VISIT  Today's visit was # 13 out of 22.  Starting weight: 294 lbs Starting date: 03/04/17 Today's weight : 236 lbs  Today's date: 10/18/2017 Total lbs lost to date: 10 (Patients must lose 7 lbs in the first 6 months to continue with counseling)   ASK: We discussed the diagnosis of obesity with Bryan Lee today and Bryan Lee agreed to give [name] permission to discuss obesity behavioral modification therapy today.  ASSESS: Bryan Lee has the diagnosis of obesity and his BMI today is 39.27 Bryan Lee is in the action stage of change   ADVISE: Bryan Lee was educated on the multiple health risks of obesity as well as the benefit of weight loss to improve his health. He was advised of the need for long term treatment and the importance of lifestyle modifications.  AGREE: Multiple dietary modification options and treatment options were discussed and  Bryan Lee agreed to keep a food journal with 1450 to 1550 calories and 95+ grams of protein daily We discussed the following Behavioral Modification Strategies today: increasing lean protein intake and work on meal planning and easy cooking plans

## 2017-10-19 LAB — LIPID PANEL WITH LDL/HDL RATIO
Cholesterol, Total: 241 mg/dL — ABNORMAL HIGH (ref 100–199)
HDL: 47 mg/dL (ref >39)
LDL Calculated: 178 mg/dL — ABNORMAL HIGH (ref 0–99)
LDl/HDL Ratio: 3.8 ratio — ABNORMAL HIGH (ref 0.0–3.6)
Triglycerides: 78 mg/dL (ref 0–149)
VLDL CHOLESTEROL CAL: 16 mg/dL (ref 5–40)

## 2017-10-19 LAB — COMPREHENSIVE METABOLIC PANEL
ALT: 34 IU/L (ref 0–44)
AST: 25 IU/L (ref 0–40)
Albumin/Globulin Ratio: 1.1 — ABNORMAL LOW (ref 1.2–2.2)
Albumin: 4 g/dL (ref 3.5–5.5)
Alkaline Phosphatase: 60 IU/L (ref 39–117)
BUN/Creatinine Ratio: 21 — ABNORMAL HIGH (ref 9–20)
BUN: 18 mg/dL (ref 6–24)
Bilirubin Total: 0.4 mg/dL (ref 0.0–1.2)
CALCIUM: 9.4 mg/dL (ref 8.7–10.2)
CO2: 30 mmol/L — AB (ref 20–29)
CREATININE: 0.86 mg/dL (ref 0.76–1.27)
Chloride: 96 mmol/L (ref 96–106)
GFR, EST AFRICAN AMERICAN: 114 mL/min/{1.73_m2} (ref >59)
GFR, EST NON AFRICAN AMERICAN: 98 mL/min/{1.73_m2} (ref >59)
GLUCOSE: 86 mg/dL (ref 65–99)
Globulin, Total: 3.7 g/dL (ref 1.5–4.5)
Potassium: 3.5 mmol/L (ref 3.5–5.2)
Sodium: 141 mmol/L (ref 134–144)
TOTAL PROTEIN: 7.7 g/dL (ref 6.0–8.5)

## 2017-10-19 LAB — HEMOGLOBIN A1C
ESTIMATED AVERAGE GLUCOSE: 105 mg/dL
Hgb A1c MFr Bld: 5.3 % (ref 4.8–5.6)

## 2017-10-19 LAB — INSULIN, RANDOM: INSULIN: 10.1 u[IU]/mL (ref 2.6–24.9)

## 2017-10-19 LAB — VITAMIN D 25 HYDROXY (VIT D DEFICIENCY, FRACTURES): Vit D, 25-Hydroxy: 31.5 ng/mL (ref 30.0–100.0)

## 2017-10-29 ENCOUNTER — Inpatient Hospital Stay: Admission: RE | Admit: 2017-10-29 | Payer: Managed Care, Other (non HMO) | Source: Ambulatory Visit

## 2017-11-11 ENCOUNTER — Encounter (INDEPENDENT_AMBULATORY_CARE_PROVIDER_SITE_OTHER): Payer: Self-pay | Admitting: Family Medicine

## 2017-11-15 ENCOUNTER — Ambulatory Visit (INDEPENDENT_AMBULATORY_CARE_PROVIDER_SITE_OTHER): Payer: Managed Care, Other (non HMO) | Admitting: Physician Assistant

## 2017-11-15 VITALS — BP 112/72 | HR 60 | Temp 98.4°F | Ht 65.0 in | Wt 237.0 lb

## 2017-11-15 DIAGNOSIS — R7303 Prediabetes: Secondary | ICD-10-CM | POA: Diagnosis not present

## 2017-11-15 DIAGNOSIS — Z9189 Other specified personal risk factors, not elsewhere classified: Secondary | ICD-10-CM | POA: Diagnosis not present

## 2017-11-15 DIAGNOSIS — E559 Vitamin D deficiency, unspecified: Secondary | ICD-10-CM | POA: Diagnosis not present

## 2017-11-15 DIAGNOSIS — Z6839 Body mass index (BMI) 39.0-39.9, adult: Secondary | ICD-10-CM | POA: Diagnosis not present

## 2017-11-15 MED ORDER — VITAMIN D (ERGOCALCIFEROL) 1.25 MG (50000 UNIT) PO CAPS: 50000.0000 [IU] | ORAL_CAPSULE | ORAL | 0 refills | Status: DC

## 2017-11-15 MED ORDER — METFORMIN HCL 500 MG PO TABS: 500.0000 mg | ORAL_TABLET | Freq: Two times a day (BID) | ORAL | 0 refills | Status: DC

## 2017-11-15 NOTE — Progress Notes (Signed)
Office: [phone]  /  Fax: [phone]   HPI:   Chief Complaint: OBESITY Nijah is here to discuss his progress with his obesity treatment plan. He is on the keep a food journal with 1450 to 1550 calories and 95+ grams of protein daily and is following his eating plan approximately 95 % of the time. He states he is exercising 0 minutes 0 times per week. Lawernce relays frustration that he is not loosing weight at a faster rate. He states he eats under his daily allowed calories and does not incorporate much variety with his meals.  He requests to have follow up with Dr. Leafy Ro. His weight is 237 lb (107.5 kg) today and has had a weight gain of 1 pound over a period of 4 weeks since his last visit. He has lost 57 lbs since starting treatment with [name].  Vitamin D deficiency Roben has a diagnosis of vitamin D deficiency. He is currently taking vit D and denies nausea, vomiting or muscle weakness.  At risk for osteopenia and osteoporosis Makani is at higher risk of osteopenia and osteoporosis due to vitamin D deficiency.   Pre-Diabetes Tahjae has a diagnosis of pre-diabetes based on his elevated Hgb A1c and was informed this puts him at greater risk of developing diabetes. He is taking metformin currently and continues to work on diet and exercise to decrease risk of diabetes. He denies nausea, polyphagia or hypoglycemia.  ALLERGIES: Allergies  Allergen Reactions  . Lactose Intolerance (Gi) Other (See Comments)    Cramping and gas pain    MEDICATIONS: Current Outpatient Medications on File Prior to Visit  Medication Sig Dispense Refill  . Naproxen Sodium (ALEVE) 220 MG CAPS Take by mouth as needed.    Marland Kitchen omega-3 acid ethyl esters (LOVAZA) 1 g capsule Take 2 g by mouth 2 (two) times daily.    . TURMERIC PO Take 2 capsules by mouth daily.     No current facility-administered medications on file prior to visit.     PAST MEDICAL HISTORY: Past Medical History:  Diagnosis Date    . Achilles rupture   . Acid reflux   . Back pain   . Carpal tunnel syndrome   . Chest pain   . Constipation   . Dizzy spells   . Joint pain   . Lactose intolerance   . Lipoma   . Numbness    legs, hands  . Obesity   . Plantar fasciitis   . Shoulder pain    rotator cuff  . SOB (shortness of breath) on exertion     PAST SURGICAL HISTORY: Past Surgical History:  Procedure Laterality Date  . ACHILLES TENDON REPAIR     left  . HERNIA REPAIR    . lump removal    . VASECTOMY      SOCIAL HISTORY: Social History   Tobacco Use  . Smoking status: Never Smoker  . Smokeless tobacco: Never Used  Substance Use Topics  . Alcohol use: Yes    Comment: social  . Drug use: No    FAMILY HISTORY: Family History  Problem Relation Age of Onset  . Coronary artery disease Father   . Heart failure Father   . Hypertension Father   . Sleep apnea Father   . Heart disease Father   . Arthritis Mother   . Obesity Mother   . Diabetes Unknown        grandmother  . Cancer Sister        GYN-type  cancer  . GER disease Daughter   . GER disease Son   . Diabetes Maternal Grandmother   . Cancer Maternal Grandmother        ovarian  . Cancer Paternal Grandmother        lung cancer, + tobacco and colon  . Seizures Sister     ROS: Review of Systems  Constitutional: Negative for weight loss.  Gastrointestinal: Negative for nausea and vomiting.  Musculoskeletal:       Negative muscle weakness  Endo/Heme/Allergies:       Negative polyphagia Negative hypoglycemia    PHYSICAL EXAM: Blood pressure 112/72, pulse 60, temperature 98.4 F (36.9 C), temperature source Oral, height 5\' 5"  (1.651 m), weight 237 lb (107.5 kg), SpO2 97 %. Body mass index is 39.44 kg/m. Physical Exam  Constitutional: He is oriented to person, place, and time. He appears well-developed and well-nourished.  Cardiovascular: Normal rate.  Pulmonary/Chest: Effort normal.  Musculoskeletal: Normal range of motion.   Neurological: He is oriented to person, place, and time.  Skin: Skin is warm and dry.  Psychiatric: He has a normal mood and affect. His behavior is normal.  Vitals reviewed.   RECENT LABS AND TESTS: BMET    Component Value Date/Time   NA 141 10/18/2017 0915   K 3.5 10/18/2017 0915   CL 96 10/18/2017 0915   CO2 30 (H) 10/18/2017 0915   GLUCOSE 86 10/18/2017 0915   GLUCOSE 123 (H) 11/07/2015 0901   BUN 18 10/18/2017 0915   CREATININE 0.86 10/18/2017 0915   CREATININE 0.89 09/08/2013 1631   CALCIUM 9.4 10/18/2017 0915   GFRNONAA 98 10/18/2017 0915   GFRAA 114 10/18/2017 0915   Lab Results  Component Value Date   HGBA1C 5.3 10/18/2017   HGBA1C 5.3 06/17/2017   HGBA1C 5.9 (H) 03/04/2017   HGBA1C 5.8 11/07/2015   Lab Results  Component Value Date   INSULIN 10.1 10/18/2017   INSULIN 9.7 06/17/2017   INSULIN 16.2 03/04/2017   CBC    Component Value Date/Time   WBC 8.5 03/04/2017 1145   WBC 8.8 11/07/2015 0901   RBC 5.18 03/04/2017 1145   RBC 5.54 11/07/2015 0901   HGB 14.3 03/04/2017 1145   HCT 42.9 03/04/2017 1145   PLT 348 03/04/2017 1145   MCV 83 03/04/2017 1145   MCH 27.6 03/04/2017 1145   MCH 27.6 09/08/2013 1631   MCHC 33.3 03/04/2017 1145   MCHC 33.1 11/07/2015 0901   RDW 14.1 03/04/2017 1145   LYMPHSABS 2.4 03/04/2017 1145   MONOABS 0.5 11/07/2015 0901   EOSABS 0.2 03/04/2017 1145   BASOSABS 0.0 03/04/2017 1145   Iron/TIBC/Ferritin/ %Sat No results found for: IRON, TIBC, FERRITIN, IRONPCTSAT Lipid Panel     Component Value Date/Time   CHOL 241 (H) 10/18/2017 0915   TRIG 78 10/18/2017 0915   HDL 47 10/18/2017 0915   CHOLHDL 6 11/07/2015 0901   VLDL 36.6 11/07/2015 0901   LDLCALC 178 (H) 10/18/2017 0915   Hepatic Function Panel     Component Value Date/Time   PROT 7.7 10/18/2017 0915   ALBUMIN 4.0 10/18/2017 0915   AST 25 10/18/2017 0915   ALT 34 10/18/2017 0915   ALKPHOS 60 10/18/2017 0915   BILITOT 0.4 10/18/2017 0915   BILIDIR 0.1  11/07/2015 0901   IBILI 0.3 09/08/2013 1631      Component Value Date/Time   TSH 0.735 03/04/2017 1145   TSH 0.96 11/07/2015 0901   TSH 0.769 09/08/2013 1631    ASSESSMENT  AND PLAN: Vitamin D deficiency - Plan: Vitamin D, Ergocalciferol, (DRISDOL) 50000 units CAPS capsule  Prediabetes - Plan: metFORMIN (GLUCOPHAGE) 500 MG tablet  At risk for osteoporosis  Class 2 severe obesity with serious comorbidity and body mass index (BMI) of 39.0 to 39.9 in adult, unspecified obesity type ([place])  PLAN:  Vitamin D Deficiency Norwood was informed that low vitamin D levels contributes to fatigue and are associated with obesity, breast, and colon cancer. He agrees to continue to take prescription Vit D @50 ,000 IU every week #4 with no refills and will follow up for routine testing of vitamin D, at least 2-3 times per year. He was informed of the risk of over-replacement of vitamin D and agrees to not increase his dose unless he discusses this with [name] first. Lavon agrees to follow up with our clinic in 4 weeks.  At risk for osteopenia and osteoporosis Emeril is at risk for osteopenia and osteoporosis due to his vitamin D deficiency. He was encouraged to take his vitamin D and follow his higher calcium diet and increase strengthening exercise to help strengthen his bones and decrease his risk of osteopenia and osteoporosis.  Pre-Diabetes Taite will continue to work on weight loss, exercise, and decreasing simple carbohydrates in his diet to help decrease the risk of diabetes. We dicussed metformin including benefits and risks. He was informed that eating too many simple carbohydrates or too many calories at one sitting increases the likelihood of GI side effects. Lavon requested metformin for now and a prescription was written today for metformin 500 mg bid #60 with no refills. Kidus agreed to follow up with [name] as directed to monitor his progress.  Obesity Elmore is currently in the action  stage of change. As such, his goal is to continue with weight loss efforts He has agreed to keep a food journal with 1450 calories and 120 grams of protein daily Elyon has been instructed to work up to a goal of 150 minutes of combined cardio and strengthening exercise per week for weight loss and overall health benefits. We discussed the following Behavioral Modification Strategies today: increasing lean protein intake and planning for success  Erek has agreed to follow up with our clinic in 4 weeks. He was informed of the importance of frequent follow up visits to maximize his success with intensive lifestyle modifications for his multiple health conditions.  I, Doreene Nest, am acting as transcriptionist for Lacy Duverney, PA-C  I have reviewed the above documentation for accuracy and completeness, and I agree with the above. -Lacy Duverney, PA-C  I have reviewed the above note and agree with the plan. -Dennard Nip, MD   OBESITY BEHAVIORAL INTERVENTION VISIT  Today's visit was # 14 out of 22.  Starting weight: 294 lbs Starting date: 03/04/17 Today's weight : 237 lbs  Today's date: 11/15/2017 Total lbs lost to date: 33 (Patients must lose 7 lbs in the first 6 months to continue with counseling)   ASK: We discussed the diagnosis of obesity with [name] today and Juanda Crumble agreed to give [name] permission to discuss obesity behavioral modification therapy today.  ASSESS: Kase has the diagnosis of obesity and his BMI today is 39.44 Rook is in the action stage of change   ADVISE: Rushil was educated on the multiple health risks of obesity as well as the benefit of weight loss to improve his health. He was advised of the need for long term treatment and the importance of lifestyle modifications.  AGREE:  Multiple dietary modification options and treatment options were discussed and  Shem agreed to keep a food journal with 1450 calories and 120 grams of protein  daily We discussed the following Behavioral Modification Strategies today: increasing lean protein intake and planning for success

## 2017-12-07 ENCOUNTER — Encounter (INDEPENDENT_AMBULATORY_CARE_PROVIDER_SITE_OTHER): Payer: Self-pay | Admitting: Physician Assistant

## 2017-12-13 ENCOUNTER — Ambulatory Visit (INDEPENDENT_AMBULATORY_CARE_PROVIDER_SITE_OTHER): Payer: Managed Care, Other (non HMO) | Admitting: Family Medicine

## 2017-12-13 VITALS — BP 115/73 | HR 54 | Temp 98.3°F | Ht 65.0 in | Wt 235.0 lb

## 2017-12-13 DIAGNOSIS — Z9189 Other specified personal risk factors, not elsewhere classified: Secondary | ICD-10-CM | POA: Diagnosis not present

## 2017-12-13 DIAGNOSIS — Z6839 Body mass index (BMI) 39.0-39.9, adult: Secondary | ICD-10-CM

## 2017-12-13 DIAGNOSIS — E559 Vitamin D deficiency, unspecified: Secondary | ICD-10-CM

## 2017-12-13 DIAGNOSIS — R7303 Prediabetes: Secondary | ICD-10-CM | POA: Diagnosis not present

## 2017-12-13 MED ORDER — METFORMIN HCL 500 MG PO TABS: 500.0000 mg | ORAL_TABLET | Freq: Two times a day (BID) | ORAL | 0 refills | Status: DC

## 2017-12-13 MED ORDER — VITAMIN D (ERGOCALCIFEROL) 1.25 MG (50000 UNIT) PO CAPS: 50000.0000 [IU] | ORAL_CAPSULE | ORAL | 0 refills | Status: DC

## 2017-12-13 NOTE — Progress Notes (Signed)
Office: [phone]  /  Fax: [phone]   HPI:   Chief Complaint: OBESITY Bryan Lee is here to discuss his progress with his obesity treatment plan. He is on the keep a food journal with 1450 calories and 120 grams of protein daily and is following his eating plan approximately 90 % of the time. He states he is exercising 0 minutes 0 times per week. Bryan Lee continues to do very well with weight loss but is frustrated that his weight los isn't faster, which has been a common frustration of his. His weight is 235 lb (106.6 kg) today and has had a weight loss of 2 pounds over a period of 4 weeks since his last visit. He has lost 59 lbs since starting treatment with [name].  Vitamin D deficiency Bryan Lee has a diagnosis of vitamin D deficiency. Bryan Lee is currently stable on vit D, but is not yet at goal and he denies nausea, vomiting or muscle weakness.  Pre-Diabetes Bryan Lee has a diagnosis of pre-diabetes based on his elevated Hgb A1c and was informed this puts him at greater risk of developing diabetes. Bryan Lee is doing well with diet and metformin. He continues to work on diet and exercise to decrease risk of diabetes. His last A1c was well controlled at 5.3 and he denies nausea, vomiting or hypoglycemia.  At risk for diabetes Bryan Lee is at higher than average risk for developing diabetes due to his obesity and pre-diabetes. He currently denies polyuria or polydipsia.  ALLERGIES: Allergies  Allergen Reactions   Lactose Intolerance (Gi) Other (See Comments)    Cramping and gas pain    MEDICATIONS: Current Outpatient Medications on File Prior to Visit  Medication Sig Dispense Refill   Naproxen Sodium (ALEVE) 220 MG CAPS Take by mouth as needed.     omega-3 acid ethyl esters (LOVAZA) 1 g capsule Take 2 g by mouth 2 (two) times daily.     TURMERIC PO Take 2 capsules by mouth daily.     No current facility-administered medications on file prior to visit.     PAST MEDICAL  HISTORY: Past Medical History:  Diagnosis Date   Achilles rupture    Acid reflux    Back pain    Carpal tunnel syndrome    Chest pain    Constipation    Dizzy spells    Joint pain    Lactose intolerance    Lipoma    Numbness    legs, hands   Obesity    Plantar fasciitis    Shoulder pain    rotator cuff   SOB (shortness of breath) on exertion     PAST SURGICAL HISTORY: Past Surgical History:  Procedure Laterality Date   ACHILLES TENDON REPAIR     left   HERNIA REPAIR     lump removal     VASECTOMY      SOCIAL HISTORY: Social History   Tobacco Use   Smoking status: Never Smoker   Smokeless tobacco: Never Used  Substance Use Topics   Alcohol use: Yes    Comment: social   Drug use: No    FAMILY HISTORY: Family History  Problem Relation Age of Onset   Coronary artery disease Father    Heart failure Father    Hypertension Father    Sleep apnea Father    Heart disease Father    Arthritis Mother    Obesity Mother    Diabetes Unknown        grandmother   Cancer Sister  GYN-type cancer   GER disease Daughter    GER disease Son    Diabetes Maternal Grandmother    Cancer Maternal Grandmother        ovarian   Cancer Paternal Grandmother        lung cancer, + tobacco and colon   Seizures Sister     ROS: Review of Systems  Constitutional: Positive for malaise/fatigue and weight loss.  Gastrointestinal: Negative for nausea and vomiting.  Genitourinary: Negative for frequency.  Musculoskeletal:       Negative muscle weakness  Endo/Heme/Allergies: Negative for polydipsia.       Negative hypoglycemia    PHYSICAL EXAM: Blood pressure 115/73, pulse (!) 54, temperature 98.3 F (36.8 C), temperature source Oral, height 5\' 5"  (1.651 m), weight 235 lb (106.6 kg), SpO2 97 %. Body mass index is 39.11 kg/m. Physical Exam  Constitutional: He is oriented to person, place, and time. He appears well-developed and  well-nourished.  Cardiovascular: Normal rate.  Pulmonary/Chest: Effort normal.  Musculoskeletal: Normal range of motion.  Neurological: He is oriented to person, place, and time.  Skin: Skin is warm and dry.  Psychiatric: He has a normal mood and affect. His behavior is normal.  Vitals reviewed.   RECENT LABS AND TESTS: BMET    Component Value Date/Time   NA 141 10/18/2017 0915   K 3.5 10/18/2017 0915   CL 96 10/18/2017 0915   CO2 30 (H) 10/18/2017 0915   GLUCOSE 86 10/18/2017 0915   GLUCOSE 123 (H) 11/07/2015 0901   BUN 18 10/18/2017 0915   CREATININE 0.86 10/18/2017 0915   CREATININE 0.89 09/08/2013 1631   CALCIUM 9.4 10/18/2017 0915   GFRNONAA 98 10/18/2017 0915   GFRAA 114 10/18/2017 0915   Lab Results  Component Value Date   HGBA1C 5.3 10/18/2017   HGBA1C 5.3 06/17/2017   HGBA1C 5.9 (H) 03/04/2017   HGBA1C 5.8 11/07/2015   Lab Results  Component Value Date   INSULIN 10.1 10/18/2017   INSULIN 9.7 06/17/2017   INSULIN 16.2 03/04/2017   CBC    Component Value Date/Time   WBC 8.5 03/04/2017 1145   WBC 8.8 11/07/2015 0901   RBC 5.18 03/04/2017 1145   RBC 5.54 11/07/2015 0901   HGB 14.3 03/04/2017 1145   HCT 42.9 03/04/2017 1145   PLT 348 03/04/2017 1145   MCV 83 03/04/2017 1145   MCH 27.6 03/04/2017 1145   MCH 27.6 09/08/2013 1631   MCHC 33.3 03/04/2017 1145   MCHC 33.1 11/07/2015 0901   RDW 14.1 03/04/2017 1145   LYMPHSABS 2.4 03/04/2017 1145   MONOABS 0.5 11/07/2015 0901   EOSABS 0.2 03/04/2017 1145   BASOSABS 0.0 03/04/2017 1145   Iron/TIBC/Ferritin/ %Sat No results found for: IRON, TIBC, FERRITIN, IRONPCTSAT Lipid Panel     Component Value Date/Time   CHOL 241 (H) 10/18/2017 0915   TRIG 78 10/18/2017 0915   HDL 47 10/18/2017 0915   CHOLHDL 6 11/07/2015 0901   VLDL 36.6 11/07/2015 0901   LDLCALC 178 (H) 10/18/2017 0915   Hepatic Function Panel     Component Value Date/Time   PROT 7.7 10/18/2017 0915   ALBUMIN 4.0 10/18/2017 0915   AST  25 10/18/2017 0915   ALT 34 10/18/2017 0915   ALKPHOS 60 10/18/2017 0915   BILITOT 0.4 10/18/2017 0915   BILIDIR 0.1 11/07/2015 0901   IBILI 0.3 09/08/2013 1631      Component Value Date/Time   TSH 0.735 03/04/2017 1145   TSH 0.96 11/07/2015 0901  TSH 0.769 09/08/2013 1631    ASSESSMENT AND PLAN: Prediabetes - Plan: metFORMIN (GLUCOPHAGE) 500 MG tablet  Vitamin D deficiency - Plan: Vitamin D, Ergocalciferol, (DRISDOL) 50000 units CAPS capsule  At risk for diabetes mellitus  Class 2 severe obesity with serious comorbidity and body mass index (BMI) of 39.0 to 39.9 in adult, unspecified obesity type ([place])  PLAN:  Vitamin D Deficiency Cobie was informed that low vitamin D levels contributes to fatigue and are associated with obesity, breast, and colon cancer. He agrees to continue to take prescription Vit D @50 ,000 IU every week #4 with no refills. We will recheck labs in 1 month and will follow up for routine testing of vitamin D, at least 2-3 times per year. He was informed of the risk of over-replacement of vitamin D and agrees to not increase his dose unless he discusses this with [name] first. Idus agrees to follow up with our clinic in 3 weeks.  Pre-Diabetes Marico will continue to work on weight loss, exercise, and decreasing simple carbohydrates in his diet to help decrease the risk of diabetes. We dicussed metformin including benefits and risks. He was informed that eating too many simple carbohydrates or too many calories at one sitting increases the likelihood of GI side effects. Yogi requested metformin for now and a prescription was written today for 1 month refill. We will recheck labs in 1 month and Marion agreed to follow up with [name] as directed to monitor his progress.  Diabetes risk counseling Thayer was given extended (15 minutes) diabetes prevention counseling today. He is 56 y.o. male and has risk factors for diabetes including obesity and pre-diabetes. We  discussed intensive lifestyle modifications today with an emphasis on weight loss as well as increasing exercise and decreasing simple carbohydrates in his diet.  Obesity Donatello is currently in the action stage of change. As such, his goal is to continue with weight loss efforts He has agreed to keep a food journal with 1500 calories and 100+ grams of protein daily Devery has been instructed to work up to a goal of 150 minutes of combined cardio and strengthening exercise per week for weight loss and overall health benefits. We discussed the following Behavioral Modification Strategies today: holiday eating strategies and celebration eating strategies  Absalom has agreed to follow up with our clinic in 3 weeks. He was informed of the importance of frequent follow up visits to maximize his success with intensive lifestyle modifications for his multiple health conditions.  I, Doreene Nest, am acting as transcriptionist for Dennard Nip, MD  I have reviewed the above documentation for accuracy and completeness, and I agree with the above. -Dennard Nip, MD    OBESITY BEHAVIORAL INTERVENTION VISIT  Today's visit was # 15 out of 58.  Starting weight: 294 lbs Starting date: 03/04/17 Today's weight : 235 lbs  Today's date: 12/13/2017 Total lbs lost to date: 68 (Patients must lose 7 lbs in the first 6 months to continue with counseling)   ASK: We discussed the diagnosis of obesity with [name] today and Juanda Crumble agreed to give [name] permission to discuss obesity behavioral modification therapy today.  ASSESS: Amadu has the diagnosis of obesity and his BMI today is 39.11 Sachit is in the action stage of change   ADVISE: Jais was educated on the multiple health risks of obesity as well as the benefit of weight loss to improve his health. He was advised of the need for long term treatment and the importance  of lifestyle modifications.  AGREE: Multiple dietary modification  options and treatment options were discussed and  Detrell agreed to keep a food journal with 1500 calories and 100+ grams of protein daily We discussed the following Behavioral Modification Strategies today: holiday eating strategies and celebration eating strategies

## 2018-01-03 ENCOUNTER — Encounter (INDEPENDENT_AMBULATORY_CARE_PROVIDER_SITE_OTHER): Payer: Self-pay

## 2018-01-03 ENCOUNTER — Ambulatory Visit (INDEPENDENT_AMBULATORY_CARE_PROVIDER_SITE_OTHER): Payer: Managed Care, Other (non HMO) | Admitting: Family Medicine

## 2018-01-11 ENCOUNTER — Encounter (INDEPENDENT_AMBULATORY_CARE_PROVIDER_SITE_OTHER): Payer: Self-pay | Admitting: Family Medicine

## 2018-01-11 ENCOUNTER — Ambulatory Visit (INDEPENDENT_AMBULATORY_CARE_PROVIDER_SITE_OTHER): Payer: Managed Care, Other (non HMO) | Admitting: Family Medicine

## 2018-01-11 VITALS — BP 125/76 | HR 68 | Temp 98.2°F | Ht 65.0 in | Wt 236.0 lb

## 2018-01-11 DIAGNOSIS — E8881 Metabolic syndrome: Secondary | ICD-10-CM | POA: Diagnosis not present

## 2018-01-11 DIAGNOSIS — Z6839 Body mass index (BMI) 39.0-39.9, adult: Secondary | ICD-10-CM | POA: Diagnosis not present

## 2018-01-11 NOTE — Progress Notes (Signed)
Office: [phone]  /  Fax: [phone]   HPI:   Chief Complaint: OBESITY Bryan Lee is here to discuss his progress with his obesity treatment plan. He is on the keep a food journal with 1500 calories and 100+ grams of protein daily and is following his eating plan approximately 85 % of the time. He states he is weight lifting for 30 minutes 3 times per week. Bryan Lee states he gained approximately 10 pounds over the holidays but went back to Category 3 plan and lost most of it. He has changed back to journaling now but mostly keeping the number's in his head.  His weight is 236 lb (107 kg) today and has gained 1 pound since his last visit. He has lost 58 lbs since starting treatment with Bryan Lee.  Insulin Resistance Bryan Lee has a diagnosis of insulin resistance based on his elevated fasting insulin level >5. Although Bryan Lee's blood glucose readings are still under good control, insulin resistance puts him at greater risk of metabolic syndrome and diabetes. He is attempting to diet control and he is tolerating metformin well and continues to work on diet and exercise to decrease risk of diabetes. He denies nausea, vomiting, or hypoglycemia.  ALLERGIES: Allergies  Allergen Reactions  . Lactose Intolerance (Gi) Other (See Comments)    Cramping and gas pain    MEDICATIONS: Current Outpatient Medications on File Prior to Visit  Medication Sig Dispense Refill  . metFORMIN (GLUCOPHAGE) 500 MG tablet Take 1 tablet (500 mg total) by mouth 2 (two) times daily with a meal. 60 tablet 0  . Naproxen Sodium (ALEVE) 220 MG CAPS Take by mouth as needed.    Marland Kitchen omega-3 acid ethyl esters (LOVAZA) 1 g capsule Take 2 g by mouth 2 (two) times daily.    . TURMERIC PO Take 2 capsules by mouth daily.    . Vitamin D, Ergocalciferol, (DRISDOL) 50000 units CAPS capsule Take 1 capsule (50,000 Units total) by mouth every 7 (seven) days. 4 capsule 0   No current facility-administered medications on file prior to visit.      PAST MEDICAL HISTORY: Past Medical History:  Diagnosis Date  . Achilles rupture   . Acid reflux   . Back pain   . Carpal tunnel syndrome   . Chest pain   . Constipation   . Dizzy spells   . Joint pain   . Lactose intolerance   . Lipoma   . Numbness    legs, hands  . Obesity   . Plantar fasciitis   . Shoulder pain    rotator cuff  . SOB (shortness of breath) on exertion     PAST SURGICAL HISTORY: Past Surgical History:  Procedure Laterality Date  . ACHILLES TENDON REPAIR     left  . HERNIA REPAIR    . lump removal    . VASECTOMY      SOCIAL HISTORY: Social History   Tobacco Use  . Smoking status: Never Smoker  . Smokeless tobacco: Never Used  Substance Use Topics  . Alcohol use: Yes    Comment: social  . Drug use: No    FAMILY HISTORY: Family History  Problem Relation Age of Onset  . Coronary artery disease Father   . Heart failure Father   . Hypertension Father   . Sleep apnea Father   . Heart disease Father   . Arthritis Mother   . Obesity Mother   . Diabetes Unknown        grandmother  .  Cancer Sister        GYN-type cancer  . GER disease Daughter   . GER disease Son   . Diabetes Maternal Grandmother   . Cancer Maternal Grandmother        ovarian  . Cancer Paternal Grandmother        lung cancer, + tobacco and colon  . Seizures Sister     ROS: Review of Systems  Constitutional: Negative for weight loss.  Gastrointestinal: Negative for nausea and vomiting.  Endo/Heme/Allergies:       Negative hypoglycemia    PHYSICAL EXAM: Blood pressure 125/76, pulse 68, temperature 98.2 F (36.8 C), temperature source Oral, height 5\' 5"  (1.651 m), weight 236 lb (107 kg), SpO2 98 %. Body mass index is 39.27 kg/m. Physical Exam  Constitutional: He is oriented to person, place, and time. He appears well-developed and well-nourished.  Cardiovascular: Normal rate.  Pulmonary/Chest: Effort normal.  Musculoskeletal: Normal range of motion.    Neurological: He is oriented to person, place, and time.  Skin: Skin is warm and dry.  Psychiatric: He has a normal mood and affect. His behavior is normal.  Vitals reviewed.   RECENT LABS AND TESTS: BMET    Component Value Date/Time   NA 141 10/18/2017 0915   K 3.5 10/18/2017 0915   CL 96 10/18/2017 0915   CO2 30 (H) 10/18/2017 0915   GLUCOSE 86 10/18/2017 0915   GLUCOSE 123 (H) 11/07/2015 0901   BUN 18 10/18/2017 0915   CREATININE 0.86 10/18/2017 0915   CREATININE 0.89 09/08/2013 1631   CALCIUM 9.4 10/18/2017 0915   GFRNONAA 98 10/18/2017 0915   GFRAA 114 10/18/2017 0915   Lab Results  Component Value Date   HGBA1C 5.3 10/18/2017   HGBA1C 5.3 06/17/2017   HGBA1C 5.9 (H) 03/04/2017   HGBA1C 5.8 11/07/2015   Lab Results  Component Value Date   INSULIN 10.1 10/18/2017   INSULIN 9.7 06/17/2017   INSULIN 16.2 03/04/2017   CBC    Component Value Date/Time   WBC 8.5 03/04/2017 1145   WBC 8.8 11/07/2015 0901   RBC 5.18 03/04/2017 1145   RBC 5.54 11/07/2015 0901   HGB 14.3 03/04/2017 1145   HCT 42.9 03/04/2017 1145   PLT 348 03/04/2017 1145   MCV 83 03/04/2017 1145   MCH 27.6 03/04/2017 1145   MCH 27.6 09/08/2013 1631   MCHC 33.3 03/04/2017 1145   MCHC 33.1 11/07/2015 0901   RDW 14.1 03/04/2017 1145   LYMPHSABS 2.4 03/04/2017 1145   MONOABS 0.5 11/07/2015 0901   EOSABS 0.2 03/04/2017 1145   BASOSABS 0.0 03/04/2017 1145   Iron/TIBC/Ferritin/ %Sat No results found for: IRON, TIBC, FERRITIN, IRONPCTSAT Lipid Panel     Component Value Date/Time   CHOL 241 (H) 10/18/2017 0915   TRIG 78 10/18/2017 0915   HDL 47 10/18/2017 0915   CHOLHDL 6 11/07/2015 0901   VLDL 36.6 11/07/2015 0901   LDLCALC 178 (H) 10/18/2017 0915   Hepatic Function Panel     Component Value Date/Time   PROT 7.7 10/18/2017 0915   ALBUMIN 4.0 10/18/2017 0915   AST 25 10/18/2017 0915   ALT 34 10/18/2017 0915   ALKPHOS 60 10/18/2017 0915   BILITOT 0.4 10/18/2017 0915   BILIDIR 0.1  11/07/2015 0901   IBILI 0.3 09/08/2013 1631      Component Value Date/Time   TSH 0.735 03/04/2017 1145   TSH 0.96 11/07/2015 0901   TSH 0.769 09/08/2013 1631    ASSESSMENT AND PLAN:  Insulin resistance  Class 2 severe obesity with serious comorbidity and body mass index (BMI) of 39.0 to 39.9 in adult, unspecified obesity type ([place])  PLAN:  Insulin Resistance Bryan Lee will continue to work on weight loss, exercise, and decreasing simple carbohydrates in his diet to help decrease the risk of diabetes. We dicussed metformin including benefits and risks. He was informed that eating too many simple carbohydrates or too many calories at one sitting increases the likelihood of GI side effects. Bryan Lee agrees to continue metformin as is and back to diet and add cardio to help. Bryan Lee agrees to follow up with our clinic in 2 to 3 weeks as directed to monitor his progress.  We spent > than 50% of the 15 minute visit on the counseling as documented in the note.  Obesity Bryan Lee is currently in the action stage of change. As such, his goal is to continue with weight loss efforts He has agreed to keep a food journal with 1500-1800 calories and 100 grams of protein daily Bryan Lee has been instructed to work up to a goal of 150 minutes of combined cardio and strengthening exercise per week as is but add lower body strengthening for weight loss and overall health benefits. We discussed the following Behavioral Modification Strategies today: increasing lean protein intake, decrease eating out and dealing with family or coworker sabotage   Bryan Lee has agreed to follow up with our clinic in 2 to 3 weeks. He was informed of the importance of frequent follow up visits to maximize his success with intensive lifestyle modifications for his multiple health conditions.   OBESITY BEHAVIORAL INTERVENTION VISIT  Today's visit was # 16 out of 22.  Starting weight: 294 lbs Starting date: 03/04/17 Today's weight  : 236 lbs  Today's date: 01/11/2018 Total lbs lost to date: 58 (Patients must lose 7 lbs in the first 6 months to continue with counseling)   ASK: We discussed the diagnosis of obesity with Bryan Lee today and Bryan Lee agreed to give Bryan Lee permission to discuss obesity behavioral modification therapy today.  ASSESS: Bryan Lee has the diagnosis of obesity and his BMI today is 39.27 Bryan Lee is in the action stage of change   ADVISE: Bryan Lee was educated on the multiple health risks of obesity as well as the benefit of weight loss to improve his health. He was advised of the need for long term treatment and the importance of lifestyle modifications.  AGREE: Multiple dietary modification options and treatment options were discussed and  Bryan Lee agreed to the above obesity treatment plan.  I, Trixie Dredge, am acting as transcriptionist for Dennard Nip, MD  I have reviewed the above documentation for accuracy and completeness, and I agree with the above. -Dennard Nip, MD

## 2018-02-03 ENCOUNTER — Encounter (INDEPENDENT_AMBULATORY_CARE_PROVIDER_SITE_OTHER): Payer: Self-pay | Admitting: Physician Assistant

## 2018-02-03 ENCOUNTER — Ambulatory Visit (INDEPENDENT_AMBULATORY_CARE_PROVIDER_SITE_OTHER): Payer: Managed Care, Other (non HMO) | Admitting: Physician Assistant

## 2018-02-03 VITALS — BP 106/71 | HR 74 | Temp 98.3°F | Ht 65.0 in | Wt 230.0 lb

## 2018-02-03 DIAGNOSIS — Z9189 Other specified personal risk factors, not elsewhere classified: Secondary | ICD-10-CM

## 2018-02-03 DIAGNOSIS — E7849 Other hyperlipidemia: Secondary | ICD-10-CM

## 2018-02-03 DIAGNOSIS — E559 Vitamin D deficiency, unspecified: Secondary | ICD-10-CM

## 2018-02-03 DIAGNOSIS — Z6838 Body mass index (BMI) 38.0-38.9, adult: Secondary | ICD-10-CM | POA: Diagnosis not present

## 2018-02-03 DIAGNOSIS — R7303 Prediabetes: Secondary | ICD-10-CM

## 2018-02-03 MED ORDER — METFORMIN HCL 500 MG PO TABS: 500.0000 mg | ORAL_TABLET | Freq: Two times a day (BID) | ORAL | 0 refills | Status: DC

## 2018-02-03 NOTE — Progress Notes (Signed)
Office: [phone]  /  Fax: [phone]   HPI:   Chief Complaint: OBESITY Bryan Lee is here to discuss his progress with his obesity treatment plan. He is on the keep a food journal with 1500 to 1800 calories and 100 grams of protein  and is following his eating plan approximately 100 % of the time. He states he is exercising 0 minutes 0 times per week. Bryan Lee continues to do well with weight loss. He is mindful of his eating and incorporates variety with his meals. He would like to continue keeping a food journal and he is encouraged to bring his food journal in next time for review. Bryan Lee requests a longer follow up time.  His weight is 230 lb (104.3 kg) today and has had a weight loss of 6 pounds over a period of 3 weeks since his last visit. He has lost 64 lbs since starting treatment with [name].  Pre-Diabetes Bryan Lee has a diagnosis of pre-diabetes based on his elevated Hgb A1c and was informed this puts him at greater risk of developing diabetes. He is taking metformin currently and continues to work on diet and exercise to decrease risk of diabetes. He denies nausea, polyphagia or hypoglycemia.  Hyperlipidemia Bryan Lee has hyperlipidemia. Bryan Lee is not on medications and he declines medications today. Bryan Lee has been trying to improve his cholesterol levels with intensive lifestyle modification including a low saturated fat diet, exercise and weight loss. He denies any chest pain or claudication.  Vitamin D deficiency Bryan Lee has a diagnosis of vitamin D deficiency. He is currently taking vit D and denies nausea, vomiting or muscle weakness.   Ref. Range 10/18/2017 09:15  Vitamin D, 25-Hydroxy Latest Ref Range: 30.0 - 100.0 ng/mL 31.5    ALLERGIES: Allergies  Allergen Reactions  . Lactose Intolerance (Gi) Other (See Comments)    Cramping and gas pain    MEDICATIONS: Current Outpatient Medications on File Prior to Visit  Medication Sig Dispense Refill  . Naproxen Sodium  (ALEVE) 220 MG CAPS Take by mouth as needed.    Bryan Lee Kitchen omega-3 acid ethyl esters (LOVAZA) 1 g capsule Take 2 g by mouth 2 (two) times daily.    . TURMERIC PO Take 2 capsules by mouth daily.    . Vitamin D, Ergocalciferol, (DRISDOL) 50000 units CAPS capsule Take 1 capsule (50,000 Units total) by mouth every 7 (seven) days. 4 capsule 0   No current facility-administered medications on file prior to visit.     PAST MEDICAL HISTORY: Past Medical History:  Diagnosis Date  . Achilles rupture   . Acid reflux   . Back pain   . Carpal tunnel syndrome   . Chest pain   . Constipation   . Dizzy spells   . Joint pain   . Lactose intolerance   . Lipoma   . Numbness    legs, hands  . Obesity   . Plantar fasciitis   . Shoulder pain    rotator cuff  . SOB (shortness of breath) on exertion     PAST SURGICAL HISTORY: Past Surgical History:  Procedure Laterality Date  . ACHILLES TENDON REPAIR     left  . HERNIA REPAIR    . lump removal    . VASECTOMY      SOCIAL HISTORY: Social History   Tobacco Use  . Smoking status: Never Smoker  . Smokeless tobacco: Never Used  Substance Use Topics  . Alcohol use: Yes    Comment: social  . Drug use: No  FAMILY HISTORY: Family History  Problem Relation Age of Onset  . Coronary artery disease Father   . Heart failure Father   . Hypertension Father   . Sleep apnea Father   . Heart disease Father   . Arthritis Mother   . Obesity Mother   . Diabetes Unknown        grandmother  . Cancer Sister        GYN-type cancer  . GER disease Daughter   . GER disease Son   . Diabetes Maternal Grandmother   . Cancer Maternal Grandmother        ovarian  . Cancer Paternal Grandmother        lung cancer, + tobacco and colon  . Seizures Sister     ROS: Review of Systems  Constitutional: Positive for weight loss.  Cardiovascular: Negative for chest pain and claudication.  Gastrointestinal: Negative for nausea and vomiting.  Musculoskeletal:        Negative for muscle weakness  Endo/Heme/Allergies:       Negative for polyphagia Negative for hypoglycemia    PHYSICAL EXAM: Blood pressure 106/71, pulse 74, temperature 98.3 F (36.8 C), temperature source Oral, height 5\' 5"  (1.651 m), weight 230 lb (104.3 kg), SpO2 97 %. Body mass index is 38.27 kg/m. Physical Exam  Constitutional: He is oriented to person, place, and time. He appears well-developed and well-nourished.  Cardiovascular: Normal rate.  Pulmonary/Chest: Effort normal.  Musculoskeletal: Normal range of motion.  Neurological: He is oriented to person, place, and time.  Skin: Skin is warm and dry.  Psychiatric: He has a normal mood and affect. His behavior is normal.  Vitals reviewed.   RECENT LABS AND TESTS: BMET    Component Value Date/Time   NA 141 10/18/2017 0915   K 3.5 10/18/2017 0915   CL 96 10/18/2017 0915   CO2 30 (H) 10/18/2017 0915   GLUCOSE 86 10/18/2017 0915   GLUCOSE 123 (H) 11/07/2015 0901   BUN 18 10/18/2017 0915   CREATININE 0.86 10/18/2017 0915   CREATININE 0.89 09/08/2013 1631   CALCIUM 9.4 10/18/2017 0915   GFRNONAA 98 10/18/2017 0915   GFRAA 114 10/18/2017 0915   Lab Results  Component Value Date   HGBA1C 5.3 10/18/2017   HGBA1C 5.3 06/17/2017   HGBA1C 5.9 (H) 03/04/2017   HGBA1C 5.8 11/07/2015   Lab Results  Component Value Date   INSULIN 10.1 10/18/2017   INSULIN 9.7 06/17/2017   INSULIN 16.2 03/04/2017   CBC    Component Value Date/Time   WBC 8.5 03/04/2017 1145   WBC 8.8 11/07/2015 0901   RBC 5.18 03/04/2017 1145   RBC 5.54 11/07/2015 0901   HGB 14.3 03/04/2017 1145   HCT 42.9 03/04/2017 1145   PLT 348 03/04/2017 1145   MCV 83 03/04/2017 1145   MCH 27.6 03/04/2017 1145   MCH 27.6 09/08/2013 1631   MCHC 33.3 03/04/2017 1145   MCHC 33.1 11/07/2015 0901   RDW 14.1 03/04/2017 1145   LYMPHSABS 2.4 03/04/2017 1145   MONOABS 0.5 11/07/2015 0901   EOSABS 0.2 03/04/2017 1145   BASOSABS 0.0 03/04/2017 1145    Iron/TIBC/Ferritin/ %Sat No results found for: IRON, TIBC, FERRITIN, IRONPCTSAT Lipid Panel     Component Value Date/Time   CHOL 241 (H) 10/18/2017 0915   TRIG 78 10/18/2017 0915   HDL 47 10/18/2017 0915   CHOLHDL 6 11/07/2015 0901   VLDL 36.6 11/07/2015 0901   LDLCALC 178 (H) 10/18/2017 0915   Hepatic Function Panel  Component Value Date/Time   PROT 7.7 10/18/2017 0915   ALBUMIN 4.0 10/18/2017 0915   AST 25 10/18/2017 0915   ALT 34 10/18/2017 0915   ALKPHOS 60 10/18/2017 0915   BILITOT 0.4 10/18/2017 0915   BILIDIR 0.1 11/07/2015 0901   IBILI 0.3 09/08/2013 1631      Component Value Date/Time   TSH 0.735 03/04/2017 1145   TSH 0.96 11/07/2015 0901   TSH 0.769 09/08/2013 1631     Ref. Range 10/18/2017 09:15  Vitamin D, 25-Hydroxy Latest Ref Range: 30.0 - 100.0 ng/mL 31.5   ASSESSMENT AND PLAN: Other hyperlipidemia - Plan: Lipid Panel With LDL/HDL Ratio  Vitamin D deficiency - Plan: VITAMIN D 25 Hydroxy (Vit-D Deficiency, Fractures)  Prediabetes - Plan: Comprehensive metabolic panel, Hemoglobin A1c, Insulin, random, metFORMIN (GLUCOPHAGE) 500 MG tablet  At risk for heart disease  Class 2 severe obesity with serious comorbidity and body mass index (BMI) of 38.0 to 38.9 in adult, unspecified obesity type Bryan Lee)  PLAN:  Pre-Diabetes Bryan Lee will continue to work on weight loss, exercise, and decreasing simple carbohydrates in his diet to help decrease the risk of diabetes. We dicussed metformin including benefits and risks. He was informed that eating too many simple carbohydrates or too many calories at one sitting increases the likelihood of GI side effects. Salih requested metformin for now and a prescription was written today for metformin 500 mg bid #60 with no refills. We will check labs and Bryan Lee agreed to follow up with [name] as directed to monitor his progress.  Hyperlipidemia Bryan Lee was informed of the American Heart Association Guidelines emphasizing  intensive lifestyle modifications as the first line treatment for hyperlipidemia. We discussed many lifestyle modifications today in depth, and Bryan Lee will continue to work on decreasing saturated fats such as fatty red meat, butter and many fried foods. He will also increase vegetables and lean protein in his diet and continue to work on exercise and weight loss efforts. We will check labs and Bryan Lee agrees to follow up as directed.  Vitamin D Deficiency Bryan Lee was informed that low vitamin D levels contributes to fatigue and are associated with obesity, breast, and colon cancer. He agrees to continue to take prescription Vit D @50 ,000 IU every week. We will check labs and will follow up for routine testing of vitamin D, at least 2-3 times per year. He was informed of the risk of over-replacement of vitamin D and agrees to not increase his dose unless he discusses this with [name] first.  Obesity Bryan Lee is currently in the action stage of change. As such, his goal is to continue with weight loss efforts He has agreed to keep a food journal with 1500 to 1800 calories and 100 grams of protein daily Bryan Lee has been instructed to work up to a goal of 150 minutes of combined cardio and strengthening exercise per week for weight loss and overall health benefits. We discussed the following Behavioral Modification Strategies today: increasing lean protein intake and work on meal planning and easy cooking plans  Bryan Lee has agreed to follow up with our clinic in 5 weeks. He was informed of the importance of frequent follow up visits to maximize his success with intensive lifestyle modifications for his multiple health conditions.   OBESITY BEHAVIORAL INTERVENTION VISIT  Today's visit was # 17 out of 22.  Starting weight: 294 lbs Starting date: 03/04/17 Today's weight : 230 lbs Today's date: 02/03/2018 Total lbs lost to date: 63 (Patients must lose 7 lbs in  the first 6 months to continue with  counseling)   ASK: We discussed the diagnosis of obesity with Bryan Lee today and Bryan Lee agreed to give [name] permission to discuss obesity behavioral modification therapy today.  ASSESS: Bryan Lee has the diagnosis of obesity and his BMI today is 38.27 Bryan Lee is in the action stage of change   ADVISE: Tyden was educated on the multiple health risks of obesity as well as the benefit of weight loss to improve his health. He was advised of the need for long term treatment and the importance of lifestyle modifications.  AGREE: Multiple dietary modification options and treatment options were discussed and  Bryan Lee agreed to the above obesity treatment plan.   Bryan Lee Skains, am acting as transcriptionist for [employer], PA-C I, Lacy Duverney [facility], have reviewed this note and agree with its content

## 2018-02-04 LAB — COMPREHENSIVE METABOLIC PANEL
ALBUMIN: 4 g/dL (ref 3.5–5.5)
ALK PHOS: 52 IU/L (ref 39–117)
ALT: 32 IU/L (ref 0–44)
AST: 26 IU/L (ref 0–40)
Albumin/Globulin Ratio: 1.1 — ABNORMAL LOW (ref 1.2–2.2)
BILIRUBIN TOTAL: 0.6 mg/dL (ref 0.0–1.2)
BUN / CREAT RATIO: 12 (ref 9–20)
BUN: 11 mg/dL (ref 6–24)
CHLORIDE: 95 mmol/L — AB (ref 96–106)
CO2: 30 mmol/L — AB (ref 20–29)
CREATININE: 0.89 mg/dL (ref 0.76–1.27)
Calcium: 9.7 mg/dL (ref 8.7–10.2)
GFR calc Af Amer: 112 mL/min/{1.73_m2} (ref >59)
GFR calc non Af Amer: 97 mL/min/{1.73_m2} (ref >59)
GLUCOSE: 92 mg/dL (ref 65–99)
Globulin, Total: 3.8 g/dL (ref 1.5–4.5)
Potassium: 3.4 mmol/L — ABNORMAL LOW (ref 3.5–5.2)
Sodium: 140 mmol/L (ref 134–144)
Total Protein: 7.8 g/dL (ref 6.0–8.5)

## 2018-02-04 LAB — LIPID PANEL WITH LDL/HDL RATIO
CHOLESTEROL TOTAL: 192 mg/dL (ref 100–199)
HDL: 42 mg/dL (ref >39)
LDL Calculated: 132 mg/dL — ABNORMAL HIGH (ref 0–99)
LDL/HDL RATIO: 3.1 ratio (ref 0.0–3.6)
TRIGLYCERIDES: 89 mg/dL (ref 0–149)
VLDL Cholesterol Cal: 18 mg/dL (ref 5–40)

## 2018-02-04 LAB — HEMOGLOBIN A1C
ESTIMATED AVERAGE GLUCOSE: 105 mg/dL
HEMOGLOBIN A1C: 5.3 % (ref 4.8–5.6)

## 2018-02-04 LAB — INSULIN, RANDOM: INSULIN: 5.6 u[IU]/mL (ref 2.6–24.9)

## 2018-02-04 LAB — VITAMIN D 25 HYDROXY (VIT D DEFICIENCY, FRACTURES): Vit D, 25-Hydroxy: 38.8 ng/mL (ref 30.0–100.0)

## 2018-02-20 ENCOUNTER — Other Ambulatory Visit (INDEPENDENT_AMBULATORY_CARE_PROVIDER_SITE_OTHER): Payer: Self-pay | Admitting: Family Medicine

## 2018-02-20 DIAGNOSIS — E559 Vitamin D deficiency, unspecified: Secondary | ICD-10-CM

## 2018-02-22 ENCOUNTER — Encounter (INDEPENDENT_AMBULATORY_CARE_PROVIDER_SITE_OTHER): Payer: Self-pay | Admitting: Physician Assistant

## 2018-02-22 ENCOUNTER — Encounter: Payer: Self-pay | Admitting: General Practice

## 2018-03-10 ENCOUNTER — Ambulatory Visit (INDEPENDENT_AMBULATORY_CARE_PROVIDER_SITE_OTHER): Payer: Managed Care, Other (non HMO) | Admitting: Physician Assistant

## 2018-03-10 VITALS — BP 112/64 | HR 61 | Temp 98.1°F | Ht 65.0 in | Wt 234.0 lb

## 2018-03-10 DIAGNOSIS — R7303 Prediabetes: Secondary | ICD-10-CM | POA: Diagnosis not present

## 2018-03-10 DIAGNOSIS — E559 Vitamin D deficiency, unspecified: Secondary | ICD-10-CM | POA: Diagnosis not present

## 2018-03-10 DIAGNOSIS — Z6839 Body mass index (BMI) 39.0-39.9, adult: Secondary | ICD-10-CM

## 2018-03-10 DIAGNOSIS — Z9189 Other specified personal risk factors, not elsewhere classified: Secondary | ICD-10-CM

## 2018-03-10 MED ORDER — METFORMIN HCL 500 MG PO TABS: 500.0000 mg | ORAL_TABLET | Freq: Two times a day (BID) | ORAL | 0 refills | Status: DC

## 2018-03-10 MED ORDER — VITAMIN D (ERGOCALCIFEROL) 1.25 MG (50000 UNIT) PO CAPS: 50000.0000 [IU] | ORAL_CAPSULE | ORAL | 0 refills | Status: DC

## 2018-03-10 NOTE — Progress Notes (Signed)
Office: [phone]  /  Fax: [phone]   HPI:   Chief Complaint: OBESITY Bryan Lee is here to discuss his progress with his obesity treatment plan. He is on the keep a food journal with 1500 to 1800 calories and 100 grams of protein daily and is following his eating plan approximately 95 % of the time. He states he is weight lifting 25 minutes 2 to 3 times per week. Bryan Lee is retaining fluids. He states he is frustrated he has not lost any weight. He states he has been working out more and and is incorporatinng more muscle strengthening/building exercise. The potential of water retention with exercise is discussed in details with patient. He is advised to stay well hydrated with his increase in physical activity. His weight is 234 lb (106.1 kg) today and has had a weight gain of 4 pounds over a period of 5 weeks since his last visit. He has lost 60 lbs since starting treatment with [name].  Vitamin D deficiency Bryan Lee has a diagnosis of vitamin D deficiency. He is currently taking vit D and denies nausea, vomiting or muscle weakness.   Ref. Range 02/03/2018 10:12  Vitamin D, 25-Hydroxy Latest Ref Range: 30.0 - 100.0 ng/mL 38.8   Pre-Diabetes Bryan Lee has a diagnosis of pre-diabetes based on his elevated Hgb A1c and was informed this puts him at greater risk of developing diabetes. He is taking metformin currently and continues to work on diet and exercise to decrease risk of diabetes. He denies nausea or hypoglycemia.  At risk for diabetes Bryan Lee is at higher than average risk for developing diabetes due to his obesity and pre-diabetes. He currently denies polyuria or polydipsia.  ALLERGIES: Allergies  Allergen Reactions  . Lactose Intolerance (Gi) Other (See Comments)    Cramping and gas pain    MEDICATIONS: Current Outpatient Medications on File Prior to Visit  Medication Sig Dispense Refill  . metFORMIN (GLUCOPHAGE) 500 MG tablet Take 1 tablet (500 mg total) by mouth 2 (two)  times daily with a meal. 60 tablet 0  . Naproxen Sodium (ALEVE) 220 MG CAPS Take by mouth as needed.    Marland Kitchen omega-3 acid ethyl esters (LOVAZA) 1 g capsule Take 2 g by mouth 2 (two) times daily.    . TURMERIC PO Take 2 capsules by mouth daily.    . Vitamin D, Ergocalciferol, (DRISDOL) 50000 units CAPS capsule Take 1 capsule (50,000 Units total) by mouth every 7 (seven) days. 4 capsule 0   No current facility-administered medications on file prior to visit.     PAST MEDICAL HISTORY: Past Medical History:  Diagnosis Date  . Achilles rupture   . Acid reflux   . Back pain   . Carpal tunnel syndrome   . Chest pain   . Constipation   . Dizzy spells   . Joint pain   . Lactose intolerance   . Lipoma   . Numbness    legs, hands  . Obesity   . Plantar fasciitis   . Shoulder pain    rotator cuff  . SOB (shortness of breath) on exertion     PAST SURGICAL HISTORY: Past Surgical History:  Procedure Laterality Date  . ACHILLES TENDON REPAIR     left  . HERNIA REPAIR    . lump removal    . VASECTOMY      SOCIAL HISTORY: Social History   Tobacco Use  . Smoking status: Never Smoker  . Smokeless tobacco: Never Used  Substance Use Topics  .  Alcohol use: Yes    Comment: social  . Drug use: No    FAMILY HISTORY: Family History  Problem Relation Age of Onset  . Coronary artery disease Father   . Heart failure Father   . Hypertension Father   . Sleep apnea Father   . Heart disease Father   . Arthritis Mother   . Obesity Mother   . Diabetes Unknown        grandmother  . Cancer Sister        GYN-type cancer  . GER disease Daughter   . GER disease Son   . Diabetes Maternal Grandmother   . Cancer Maternal Grandmother        ovarian  . Cancer Paternal Grandmother        lung cancer, + tobacco and colon  . Seizures Sister     ROS: Review of Systems  Constitutional: Negative for weight loss.  Gastrointestinal: Negative for nausea and vomiting.  Genitourinary: Negative  for frequency.  Musculoskeletal:       Negative for muscle weakness  Endo/Heme/Allergies: Negative for polydipsia.       Negative for hypoglycemia    PHYSICAL EXAM: Blood pressure 112/64, pulse 61, temperature 98.1 F (36.7 C), temperature source Oral, height 5\' 5"  (1.651 m), weight 234 lb (106.1 kg), SpO2 97 %. Body mass index is 38.94 kg/m. Physical Exam  Constitutional: He is oriented to person, place, and time. He appears well-developed and well-nourished.  Cardiovascular: Normal rate.  Pulmonary/Chest: Effort normal.  Musculoskeletal: Normal range of motion.  Neurological: He is oriented to person, place, and time.  Skin: Skin is warm and dry.  Psychiatric: He has a normal mood and affect. His behavior is normal.  Vitals reviewed.   RECENT LABS AND TESTS: BMET    Component Value Date/Time   NA 140 02/03/2018 1012   K 3.4 (L) 02/03/2018 1012   CL 95 (L) 02/03/2018 1012   CO2 30 (H) 02/03/2018 1012   GLUCOSE 92 02/03/2018 1012   GLUCOSE 123 (H) 11/07/2015 0901   BUN 11 02/03/2018 1012   CREATININE 0.89 02/03/2018 1012   CREATININE 0.89 09/08/2013 1631   CALCIUM 9.7 02/03/2018 1012   GFRNONAA 97 02/03/2018 1012   GFRAA 112 02/03/2018 1012   Lab Results  Component Value Date   HGBA1C 5.3 02/03/2018   HGBA1C 5.3 10/18/2017   HGBA1C 5.3 06/17/2017   HGBA1C 5.9 (H) 03/04/2017   HGBA1C 5.8 11/07/2015   Lab Results  Component Value Date   INSULIN 5.6 02/03/2018   INSULIN 10.1 10/18/2017   INSULIN 9.7 06/17/2017   INSULIN 16.2 03/04/2017   CBC    Component Value Date/Time   WBC 8.5 03/04/2017 1145   WBC 8.8 11/07/2015 0901   RBC 5.18 03/04/2017 1145   RBC 5.54 11/07/2015 0901   HGB 14.3 03/04/2017 1145   HCT 42.9 03/04/2017 1145   PLT 348 03/04/2017 1145   MCV 83 03/04/2017 1145   MCH 27.6 03/04/2017 1145   MCH 27.6 09/08/2013 1631   MCHC 33.3 03/04/2017 1145   MCHC 33.1 11/07/2015 0901   RDW 14.1 03/04/2017 1145   LYMPHSABS 2.4 03/04/2017 1145    MONOABS 0.5 11/07/2015 0901   EOSABS 0.2 03/04/2017 1145   BASOSABS 0.0 03/04/2017 1145   Iron/TIBC/Ferritin/ %Sat No results found for: IRON, TIBC, FERRITIN, IRONPCTSAT Lipid Panel     Component Value Date/Time   CHOL 192 02/03/2018 1012   TRIG 89 02/03/2018 1012   HDL 42 02/03/2018 1012  CHOLHDL 6 11/07/2015 0901   VLDL 36.6 11/07/2015 0901   LDLCALC 132 (H) 02/03/2018 1012   Hepatic Function Panel     Component Value Date/Time   PROT 7.8 02/03/2018 1012   ALBUMIN 4.0 02/03/2018 1012   AST 26 02/03/2018 1012   ALT 32 02/03/2018 1012   ALKPHOS 52 02/03/2018 1012   BILITOT 0.6 02/03/2018 1012   BILIDIR 0.1 11/07/2015 0901   IBILI 0.3 09/08/2013 1631      Component Value Date/Time   TSH 0.735 03/04/2017 1145   TSH 0.96 11/07/2015 0901   TSH 0.769 09/08/2013 1631    ASSESSMENT AND PLAN: Prediabetes - Plan: metFORMIN (GLUCOPHAGE) 500 MG tablet  Vitamin D deficiency - Plan: Vitamin D, Ergocalciferol, (DRISDOL) 50000 units CAPS capsule  At risk for diabetes mellitus  Class 2 severe obesity with serious comorbidity and body mass index (BMI) of 39.0 to 39.9 in adult, unspecified obesity type (Bryan Lee)  PLAN:  Vitamin D Deficiency Bryan Lee was informed that low vitamin D levels contributes to fatigue and are associated with obesity, breast, and colon cancer. He agrees to continue to take prescription Vit D @50 ,000 IU every week #4 with no refills and will follow up for routine testing of vitamin D, at least 2-3 times per year. He was informed of the risk of over-replacement of vitamin D and agrees to not increase his dose unless he discusses this with [name] first. Bryan Lee agrees to follow up as directed.  Pre-Diabetes Bryan Lee will continue to work on weight loss, exercise, and decreasing simple carbohydrates in his diet to help decrease the risk of diabetes. We dicussed metformin including benefits and risks. He was informed that eating too many simple carbohydrates or too many  calories at one sitting increases the likelihood of GI side effects. Bryan Lee agrees to continue metformin 500 mg bid #60 with no refills and follow up with [name] as directed to monitor his progress.  Diabetes risk counseling Bryan Lee was given extended (15 minutes) diabetes prevention counseling today. He is 57 y.o. male and has risk factors for diabetes including obesity and prediabetes. We discussed intensive lifestyle modifications today with an emphasis on weight loss as well as increasing exercise and decreasing simple carbohydrates in his diet.  Obesity Bryan Lee is currently in the action stage of change. As such, his goal is to continue with weight loss efforts He states he might do a combination of low carb and category 2 and category 3 plans Bryan Lee has been instructed to work up to a goal of 150 minutes of combined cardio and strengthening exercise per week for weight loss and overall health benefits. We discussed the following Behavioral Modification Strategies today: increase H2O intake and planning for success  Bryan Lee has agreed to follow up with our clinic in 2 weeks. He was informed of the importance of frequent follow up visits to maximize his success with intensive lifestyle modifications for his multiple health conditions.   OBESITY BEHAVIORAL INTERVENTION VISIT  Today's visit was # 18 out of 22.  Starting weight: 294 lbs Starting date: 03/04/17 Today's weight : 234 lbs Today's date: 03/10/2018 Total lbs lost to date: 68 (Patients must lose 7 lbs in the first 6 months to continue with counseling)   ASK: We discussed the diagnosis of obesity with Bryan Lee today and Bryan Lee agreed to give [name] permission to discuss obesity behavioral modification therapy today.  ASSESS: Bryan Lee has the diagnosis of obesity and his BMI today is 38.94 Kawan is in the action  stage of change   ADVISE: Bryan Lee was educated on the multiple health risks of obesity as well as the benefit  of weight loss to improve his health. He was advised of the need for long term treatment and the importance of lifestyle modifications.  AGREE: Multiple dietary modification options and treatment options were discussed and  Bryan Lee agreed to the above obesity treatment plan.   Corey Skains, am acting as transcriptionist for [employer], PA-C I, Lacy Duverney [facility], have reviewed this note and agree with its content

## 2018-04-03 ENCOUNTER — Other Ambulatory Visit (INDEPENDENT_AMBULATORY_CARE_PROVIDER_SITE_OTHER): Payer: Self-pay | Admitting: Physician Assistant

## 2018-04-03 DIAGNOSIS — E559 Vitamin D deficiency, unspecified: Secondary | ICD-10-CM

## 2018-04-21 ENCOUNTER — Ambulatory Visit (INDEPENDENT_AMBULATORY_CARE_PROVIDER_SITE_OTHER): Payer: Managed Care, Other (non HMO) | Admitting: Family Medicine

## 2018-04-21 VITALS — BP 115/74 | HR 60 | Temp 98.1°F | Ht 65.0 in | Wt 236.0 lb

## 2018-04-21 DIAGNOSIS — Z9189 Other specified personal risk factors, not elsewhere classified: Secondary | ICD-10-CM

## 2018-04-21 DIAGNOSIS — M722 Plantar fascial fibromatosis: Secondary | ICD-10-CM | POA: Diagnosis not present

## 2018-04-21 DIAGNOSIS — R7303 Prediabetes: Secondary | ICD-10-CM | POA: Diagnosis not present

## 2018-04-21 DIAGNOSIS — Z6839 Body mass index (BMI) 39.0-39.9, adult: Secondary | ICD-10-CM | POA: Diagnosis not present

## 2018-04-21 DIAGNOSIS — E559 Vitamin D deficiency, unspecified: Secondary | ICD-10-CM | POA: Diagnosis not present

## 2018-04-21 MED ORDER — MELOXICAM 15 MG PO TABS: 15.0000 mg | ORAL_TABLET | Freq: Every day | ORAL | 0 refills | Status: DC

## 2018-04-21 MED ORDER — VITAMIN D (ERGOCALCIFEROL) 1.25 MG (50000 UNIT) PO CAPS: 50000.0000 [IU] | ORAL_CAPSULE | ORAL | 0 refills | Status: DC

## 2018-04-21 MED ORDER — METFORMIN HCL 500 MG PO TABS: 500.0000 mg | ORAL_TABLET | Freq: Two times a day (BID) | ORAL | 0 refills | Status: DC

## 2018-04-21 NOTE — Progress Notes (Signed)
Office: [phone]  /  Fax: [phone]   HPI:   Chief Complaint: OBESITY Bryan Lee is here to discuss his progress with his obesity treatment plan. He is on a combination of the lower carbohydrate, vegetable and lean protein rich diet plan, Category 2 plan and the Category 3 plan and is following his eating plan approximately 85 % of the time. He states he is exercising 0 minutes 0 times per week. [name] traveled recently and struggled with eating while traveling. His weight is   today and has had a weight gain of 2 pounds over a period of 6 weeks since his last visit. He has lost 60 lbs since starting treatment with Bryan Lee.  Vitamin D deficiency Bryan Lee has a diagnosis of vitamin D deficiency. He is currently taking vit D and denies nausea, vomiting or muscle weakness.  Plantar Fasciitis Bryan Lee has a diagnosis of plantar fasciitis and has had a significant increase in pain recently.  Pre-Diabetes Bryan Lee has a diagnosis of pre-diabetes based on his elevated Hgb A1c and was informed this puts him at greater risk of developing diabetes. He is taking metformin currently and continues to work on diet and exercise to decrease risk of diabetes. He denies any significant cravings or hypoglycemia.  At risk for diabetes Bryan Lee is at higher than average risk for developing diabetes due to his obesity and pre-diabetes. He currently denies polyuria or polydipsia.  ALLERGIES: Allergies  Allergen Reactions  . Lactose Intolerance (Gi) Other (See Comments)    Cramping and gas pain    MEDICATIONS: Current Outpatient Medications on File Prior to Visit  Medication Sig Dispense Refill  . omega-3 acid ethyl esters (LOVAZA) 1 g capsule Take 2 g by mouth 2 (two) times daily.    . TURMERIC PO Take 2 capsules by mouth daily.     No current facility-administered medications on file prior to visit.     PAST MEDICAL HISTORY: Past Medical History:  Diagnosis Date  . Achilles rupture   . Acid  reflux   . Back pain   . Carpal tunnel syndrome   . Chest pain   . Constipation   . Dizzy spells   . Joint pain   . Lactose intolerance   . Lipoma   . Numbness    legs, hands  . Obesity   . Plantar fasciitis   . Shoulder pain    rotator cuff  . SOB (shortness of breath) on exertion     PAST SURGICAL HISTORY: Past Surgical History:  Procedure Laterality Date  . ACHILLES TENDON REPAIR     left  . HERNIA REPAIR    . lump removal    . VASECTOMY      SOCIAL HISTORY: Social History   Tobacco Use  . Smoking status: Never Smoker  . Smokeless tobacco: Never Used  Substance Use Topics  . Alcohol use: Yes    Comment: social  . Drug use: No    FAMILY HISTORY: Family History  Problem Relation Age of Onset  . Coronary artery disease Father   . Heart failure Father   . Hypertension Father   . Sleep apnea Father   . Heart disease Father   . Arthritis Mother   . Obesity Mother   . Diabetes Unknown        grandmother  . Cancer Sister        GYN-type cancer  . GER disease Daughter   . GER disease Son   . Diabetes Maternal Grandmother   .  Cancer Maternal Grandmother        ovarian  . Cancer Paternal Grandmother        lung cancer, + tobacco and colon  . Seizures Sister     ROS: Review of Systems  Constitutional: Negative for weight loss.  Gastrointestinal: Negative for nausea and vomiting.  Genitourinary: Negative for frequency.  Musculoskeletal:       Negative for muscle weakness Positive for foot pain  Endo/Heme/Allergies: Negative for polydipsia.       Negative for cravings Negative for hypoglycemia    PHYSICAL EXAM: Blood pressure 115/74, pulse 60, temperature 98.1 F (36.7 C), temperature source Oral, SpO2 97 %. There is no height or weight on file to calculate BMI. Physical Exam  Constitutional: He is oriented to person, place, and time. He appears well-developed and well-nourished.  Cardiovascular: Normal rate.  Pulmonary/Chest: Effort normal.    Musculoskeletal: Normal range of motion.  Neurological: He is oriented to person, place, and time.  Skin: Skin is warm and dry.  Psychiatric: He has a normal mood and affect. His behavior is normal.  Vitals reviewed.   RECENT LABS AND TESTS: BMET    Component Value Date/Time   NA 140 02/03/2018 1012   K 3.4 (L) 02/03/2018 1012   CL 95 (L) 02/03/2018 1012   CO2 30 (H) 02/03/2018 1012   GLUCOSE 92 02/03/2018 1012   GLUCOSE 123 (H) 11/07/2015 0901   BUN 11 02/03/2018 1012   CREATININE 0.89 02/03/2018 1012   CREATININE 0.89 09/08/2013 1631   CALCIUM 9.7 02/03/2018 1012   GFRNONAA 97 02/03/2018 1012   GFRAA 112 02/03/2018 1012   Lab Results  Component Value Date   HGBA1C 5.3 02/03/2018   HGBA1C 5.3 10/18/2017   HGBA1C 5.3 06/17/2017   HGBA1C 5.9 (H) 03/04/2017   HGBA1C 5.8 11/07/2015   Lab Results  Component Value Date   INSULIN 5.6 02/03/2018   INSULIN 10.1 10/18/2017   INSULIN 9.7 06/17/2017   INSULIN 16.2 03/04/2017   CBC    Component Value Date/Time   WBC 8.5 03/04/2017 1145   WBC 8.8 11/07/2015 0901   RBC 5.18 03/04/2017 1145   RBC 5.54 11/07/2015 0901   HGB 14.3 03/04/2017 1145   HCT 42.9 03/04/2017 1145   PLT 348 03/04/2017 1145   MCV 83 03/04/2017 1145   MCH 27.6 03/04/2017 1145   MCH 27.6 09/08/2013 1631   MCHC 33.3 03/04/2017 1145   MCHC 33.1 11/07/2015 0901   RDW 14.1 03/04/2017 1145   LYMPHSABS 2.4 03/04/2017 1145   MONOABS 0.5 11/07/2015 0901   EOSABS 0.2 03/04/2017 1145   BASOSABS 0.0 03/04/2017 1145   Iron/TIBC/Ferritin/ %Sat No results found for: IRON, TIBC, FERRITIN, IRONPCTSAT Lipid Panel     Component Value Date/Time   CHOL 192 02/03/2018 1012   TRIG 89 02/03/2018 1012   HDL 42 02/03/2018 1012   CHOLHDL 6 11/07/2015 0901   VLDL 36.6 11/07/2015 0901   LDLCALC 132 (H) 02/03/2018 1012   Hepatic Function Panel     Component Value Date/Time   PROT 7.8 02/03/2018 1012   ALBUMIN 4.0 02/03/2018 1012   AST 26 02/03/2018 1012   ALT  32 02/03/2018 1012   ALKPHOS 52 02/03/2018 1012   BILITOT 0.6 02/03/2018 1012   BILIDIR 0.1 11/07/2015 0901   IBILI 0.3 09/08/2013 1631      Component Value Date/Time   TSH 0.735 03/04/2017 1145   TSH 0.96 11/07/2015 0901   TSH 0.769 09/08/2013 1631   Results  for Bryan Lee (MRN [id number]) as of 04/21/2018 17:24  Ref. Range 02/03/2018 10:12  Vitamin D, 25-Hydroxy Latest Ref Range: 30.0 - 100.0 ng/mL 38.8   ASSESSMENT AND PLAN: Plantar fasciitis - Plan: meloxicam (MOBIC) 15 MG tablet  Prediabetes - Plan: metFORMIN (GLUCOPHAGE) 500 MG tablet  Vitamin D deficiency - Plan: Vitamin D, Ergocalciferol, (DRISDOL) 50000 units CAPS capsule  At risk for diabetes mellitus  Class 2 severe obesity with serious comorbidity and body mass index (BMI) of 39.0 to 39.9 in adult, unspecified obesity type ([place])  PLAN:  Vitamin D Deficiency Darreon was informed that low vitamin D levels contributes to fatigue and are associated with obesity, breast, and colon cancer. He agrees to continue to take prescription Vit D @50 ,000 IU every week #4 with no refills and will follow up for routine testing of vitamin D, at least 2-3 times per year. He was informed of the risk of over-replacement of vitamin D and agrees to not increase his dose unless he discusses this with Bryan Lee first. Shraga agrees to follow up with our clinic in 2 weeks.  Plantar Fasciitis Jaycob agrees to start Mobic 15 mg daily #30 with no refills and stop Naproxen, while taking Mobic. Zymir agrees to follow up with our clinic in 2 weeks.  Pre-Diabetes Mattthew will continue to work on weight loss, exercise, and decreasing simple carbohydrates in his diet to help decrease the risk of diabetes. We dicussed metformin including benefits and risks. He was informed that eating too many simple carbohydrates or too many calories at one sitting increases the likelihood of GI side effects. Aashir agreed to continue metformin 500 mg qAM and qPM #60  with no refills and  follow up with Bryan Lee as directed to monitor his progress.  Diabetes risk counseling Ameer was given extended (15 minutes) diabetes prevention counseling today. He is 57 y.o. male and has risk factors for diabetes including obesity and pre-diabetes. We discussed intensive lifestyle modifications today with an emphasis on weight loss as well as increasing exercise and decreasing simple carbohydrates in his diet.  Obesity Salmaan is currently in the action stage of change. As such, his goal is to continue with weight loss efforts He has agreed to follow the Category 3 plan Amel has been instructed to work up to a goal of 150 minutes of combined cardio and strengthening exercise per week for weight loss and overall health benefits. We discussed the following Behavioral Modification Strategies today: planning for success, increasing lean protein intake, increasing vegetables and work on meal planning and easy cooking plans  Bjorn has agreed to follow up with our clinic in 2 weeks. He was informed of the importance of frequent follow up visits to maximize his success with intensive lifestyle modifications for his multiple health conditions.   OBESITY BEHAVIORAL INTERVENTION VISIT  Today's visit was # 19 out of 22.  Starting weight: 294 lbs Starting date: 03/04/17 Today's weight : 234 lbs  Today's date: 04/21/2018 Total lbs lost to date: 68 (Patients must lose 7 lbs in the first 6 months to continue with counseling)   ASK: We discussed the diagnosis of obesity with [name] today and Juanda Crumble agreed to give Bryan Lee permission to discuss obesity behavioral modification therapy today.  ASSESS: Khing has the diagnosis of obesity and his BMI today is 38.94 Avel is in the action stage of change   ADVISE: Alize was educated on the multiple health risks of obesity as well as the benefit of weight loss  to improve his health. He was advised of the need for long term  treatment and the importance of lifestyle modifications.  AGREE: Multiple dietary modification options and treatment options were discussed and  Dina agreed to the above obesity treatment plan.  I, Doreene Nest, am acting as transcriptionist for Eber Jones, MD  I have reviewed the above documentation for accuracy and completeness, and I agree with the above. - Ilene Qua, MD

## 2018-05-19 ENCOUNTER — Ambulatory Visit (INDEPENDENT_AMBULATORY_CARE_PROVIDER_SITE_OTHER): Payer: Managed Care, Other (non HMO) | Admitting: Family Medicine

## 2018-05-19 ENCOUNTER — Encounter (INDEPENDENT_AMBULATORY_CARE_PROVIDER_SITE_OTHER): Payer: Self-pay

## 2018-05-25 ENCOUNTER — Other Ambulatory Visit (INDEPENDENT_AMBULATORY_CARE_PROVIDER_SITE_OTHER): Payer: Self-pay | Admitting: Family Medicine

## 2018-05-25 DIAGNOSIS — E559 Vitamin D deficiency, unspecified: Secondary | ICD-10-CM

## 2018-05-25 DIAGNOSIS — R7303 Prediabetes: Secondary | ICD-10-CM

## 2018-05-25 DIAGNOSIS — M722 Plantar fascial fibromatosis: Secondary | ICD-10-CM

## 2018-06-07 ENCOUNTER — Ambulatory Visit (INDEPENDENT_AMBULATORY_CARE_PROVIDER_SITE_OTHER): Payer: Managed Care, Other (non HMO) | Admitting: Family Medicine

## 2018-06-07 VITALS — BP 129/80 | HR 53 | Temp 98.0°F | Ht 65.0 in | Wt 238.0 lb

## 2018-06-07 DIAGNOSIS — E559 Vitamin D deficiency, unspecified: Secondary | ICD-10-CM | POA: Diagnosis not present

## 2018-06-07 DIAGNOSIS — Z6839 Body mass index (BMI) 39.0-39.9, adult: Secondary | ICD-10-CM

## 2018-06-07 DIAGNOSIS — R7303 Prediabetes: Secondary | ICD-10-CM

## 2018-06-07 MED ORDER — METFORMIN HCL 500 MG PO TABS: 500.0000 mg | ORAL_TABLET | Freq: Two times a day (BID) | ORAL | 0 refills | Status: DC

## 2018-06-07 MED ORDER — VITAMIN D (ERGOCALCIFEROL) 1.25 MG (50000 UNIT) PO CAPS: 50000.0000 [IU] | ORAL_CAPSULE | ORAL | 0 refills | Status: DC

## 2018-06-07 NOTE — Progress Notes (Signed)
Office: [phone]  /  Fax: [phone]   HPI:   Chief Complaint: OBESITY Bryan Lee is here to discuss his progress with his obesity treatment plan. He is on the Category 3 plan and is following his eating plan approximately 75 % of the time. He states he is exercising 0 minutes 0 times per week. Bryan Lee has had increased celebration eating with daughter's recent graduation. He is frustrated he has gained for the last 3 months. he is ready to get back on track.  His weight is 238 lb (108 kg) today and has gained 2 pounds since his last visit. He has lost 56 lbs since starting treatment with [name].  Pre-Diabetes Bryan Lee has a diagnosis of pre-diabetes based on his elevated Hgb A1c and was informed this puts him at greater risk of developing diabetes. He is stable on metformin, not doing as well with diet and he is due for labs. He continues to work on diet and exercise to decrease risk of diabetes. He denies nausea, vomiting, or hypoglycemia.  Vitamin D Deficiency Bryan Lee has a diagnosis of vitamin D deficiency. He is stable on prescription Vit D, due for labs. He denies nausea, vomiting or muscle weakness.  ALLERGIES: Allergies  Allergen Reactions  . Lactose Intolerance (Gi) Other (See Comments)    Cramping and gas pain    MEDICATIONS: Current Outpatient Medications on File Prior to Visit  Medication Sig Dispense Refill  . meloxicam (MOBIC) 15 MG tablet Take 1 tablet (15 mg total) by mouth daily. 30 tablet 0  . metFORMIN (GLUCOPHAGE) 500 MG tablet Take 1 tablet (500 mg total) by mouth 2 (two) times daily with a meal. 60 tablet 0  . omega-3 acid ethyl esters (LOVAZA) 1 g capsule Take 2 g by mouth 2 (two) times daily.    . TURMERIC PO Take 2 capsules by mouth daily.    . Vitamin D, Ergocalciferol, (DRISDOL) 50000 units CAPS capsule Take 1 capsule (50,000 Units total) by mouth every 7 (seven) days. 4 capsule 0   No current facility-administered medications on file prior to visit.      PAST MEDICAL HISTORY: Past Medical History:  Diagnosis Date  . Achilles rupture   . Acid reflux   . Back pain   . Carpal tunnel syndrome   . Chest pain   . Constipation   . Dizzy spells   . Joint pain   . Lactose intolerance   . Lipoma   . Numbness    legs, hands  . Obesity   . Plantar fasciitis   . Shoulder pain    rotator cuff  . SOB (shortness of breath) on exertion     PAST SURGICAL HISTORY: Past Surgical History:  Procedure Laterality Date  . ACHILLES TENDON REPAIR     left  . HERNIA REPAIR    . lump removal    . VASECTOMY      SOCIAL HISTORY: Social History   Tobacco Use  . Smoking status: Never Smoker  . Smokeless tobacco: Never Used  Substance Use Topics  . Alcohol use: Yes    Comment: social  . Drug use: No    FAMILY HISTORY: Family History  Problem Relation Age of Onset  . Coronary artery disease Father   . Heart failure Father   . Hypertension Father   . Sleep apnea Father   . Heart disease Father   . Arthritis Mother   . Obesity Mother   . Diabetes Unknown  grandmother  . Cancer Sister        GYN-type cancer  . GER disease Daughter   . GER disease Son   . Diabetes Maternal Grandmother   . Cancer Maternal Grandmother        ovarian  . Cancer Paternal Grandmother        lung cancer, + tobacco and colon  . Seizures Sister     ROS: Review of Systems  Constitutional: Negative for weight loss.  Gastrointestinal: Negative for nausea and vomiting.  Musculoskeletal:       Negative muscle weakness  Endo/Heme/Allergies:       Negative hypoglycemia    PHYSICAL EXAM: Blood pressure 129/80, pulse (!) 53, temperature 98 F (36.7 C), temperature source Oral, height 5\' 5"  (1.651 m), weight 238 lb (108 kg), SpO2 97 %. Body mass index is 39.61 kg/m. Physical Exam  Constitutional: He is oriented to person, place, and time. He appears well-developed and well-nourished.  Cardiovascular: Normal rate.  Pulmonary/Chest: Effort  normal.  Musculoskeletal: Normal range of motion.  Neurological: He is oriented to person, place, and time.  Skin: Skin is warm and dry.  Psychiatric: He has a normal mood and affect. His behavior is normal.  Vitals reviewed.   RECENT LABS AND TESTS: BMET    Component Value Date/Time   NA 140 02/03/2018 1012   K 3.4 (L) 02/03/2018 1012   CL 95 (L) 02/03/2018 1012   CO2 30 (H) 02/03/2018 1012   GLUCOSE 92 02/03/2018 1012   GLUCOSE 123 (H) 11/07/2015 0901   BUN 11 02/03/2018 1012   CREATININE 0.89 02/03/2018 1012   CREATININE 0.89 09/08/2013 1631   CALCIUM 9.7 02/03/2018 1012   GFRNONAA 97 02/03/2018 1012   GFRAA 112 02/03/2018 1012   Lab Results  Component Value Date   HGBA1C 5.3 02/03/2018   HGBA1C 5.3 10/18/2017   HGBA1C 5.3 06/17/2017   HGBA1C 5.9 (H) 03/04/2017   HGBA1C 5.8 11/07/2015   Lab Results  Component Value Date   INSULIN 5.6 02/03/2018   INSULIN 10.1 10/18/2017   INSULIN 9.7 06/17/2017   INSULIN 16.2 03/04/2017   CBC    Component Value Date/Time   WBC 8.5 03/04/2017 1145   WBC 8.8 11/07/2015 0901   RBC 5.18 03/04/2017 1145   RBC 5.54 11/07/2015 0901   HGB 14.3 03/04/2017 1145   HCT 42.9 03/04/2017 1145   PLT 348 03/04/2017 1145   MCV 83 03/04/2017 1145   MCH 27.6 03/04/2017 1145   MCH 27.6 09/08/2013 1631   MCHC 33.3 03/04/2017 1145   MCHC 33.1 11/07/2015 0901   RDW 14.1 03/04/2017 1145   LYMPHSABS 2.4 03/04/2017 1145   MONOABS 0.5 11/07/2015 0901   EOSABS 0.2 03/04/2017 1145   BASOSABS 0.0 03/04/2017 1145   Iron/TIBC/Ferritin/ %Sat No results found for: IRON, TIBC, FERRITIN, IRONPCTSAT Lipid Panel     Component Value Date/Time   CHOL 192 02/03/2018 1012   TRIG 89 02/03/2018 1012   HDL 42 02/03/2018 1012   CHOLHDL 6 11/07/2015 0901   VLDL 36.6 11/07/2015 0901   LDLCALC 132 (H) 02/03/2018 1012   Hepatic Function Panel     Component Value Date/Time   PROT 7.8 02/03/2018 1012   ALBUMIN 4.0 02/03/2018 1012   AST 26 02/03/2018 1012    ALT 32 02/03/2018 1012   ALKPHOS 52 02/03/2018 1012   BILITOT 0.6 02/03/2018 1012   BILIDIR 0.1 11/07/2015 0901   IBILI 0.3 09/08/2013 1631      Component Value  Date/Time   TSH 0.735 03/04/2017 1145   TSH 0.96 11/07/2015 0901   TSH 0.769 09/08/2013 1631  Results for KARY, COLAIZZI (MRN [id number]) as of 06/07/2018 08:00  Ref. Range 02/03/2018 10:12  Vitamin D, 25-Hydroxy Latest Ref Range: 30.0 - 100.0 ng/mL 38.8    ASSESSMENT AND PLAN: Prediabetes - Plan: Vitamin B12, CBC With Differential, Comprehensive metabolic panel, Folate, Hemoglobin A1c, Insulin, random, Lipid Panel With LDL/HDL Ratio, T3, T4, free, TSH, metFORMIN (GLUCOPHAGE) 500 MG tablet  Vitamin D deficiency - Plan: VITAMIN D 25 Hydroxy (Vit-D Deficiency, Fractures), Vitamin D, Ergocalciferol, (DRISDOL) 50000 units CAPS capsule  Class 2 severe obesity with serious comorbidity and body mass index (BMI) of 39.0 to 39.9 in adult, unspecified obesity type [facility])  PLAN:  Pre-Diabetes Samiel will continue to work on weight loss, exercise, and decreasing simple carbohydrates in his diet to help decrease the risk of diabetes. We dicussed metformin including benefits and risks. He was informed that eating too many simple carbohydrates or too many calories at one sitting increases the likelihood of GI side effects. Dairon agrees to continue taking metformin 500 mg BID #60 and we will refill for 1 month. We will check labs and Jabier agrees to follow up with our clinic in 3 weeks as directed to monitor his progress.  Vitamin D Deficiency Guerino was informed that low vitamin D levels contributes to fatigue and are associated with obesity, breast, and colon cancer. Alphons agrees to continue taking prescription Vit D @50 ,000 IU every week #4 and we will refill for 1 month. He will follow up for routine testing of vitamin D, at least 2-3 times per year. He was informed of the risk of over-replacement of vitamin D and agrees to not  increase his dose unless he discusses this with [name] first. We will check labs and Kaylib agrees to follow up with our clinic in 3 weeks.  Obesity Ireoluwa is currently in the action stage of change. As such, his goal is to continue with weight loss efforts He has agreed to change to follow a lower carbohydrate, vegetable and lean protein rich diet plan Nathanuel has been instructed to work up to a goal of 150 minutes of combined cardio and strengthening exercise per week for weight loss and overall health benefits. We discussed the following Behavioral Modification Strategies today: increasing lean protein intake and decreasing simple carbohydrates    Levelle has agreed to follow up with our clinic in 3 weeks. He was informed of the importance of frequent follow up visits to maximize his success with intensive lifestyle modifications for his multiple health conditions.   OBESITY BEHAVIORAL INTERVENTION VISIT  Today's visit was # 20 out of 22.  Starting weight: 294 lbs Starting date: 03/04/17 Today's weight : 238 lbs Today's date: 06/07/2018 Total lbs lost to date: 56 (Patients must lose 7 lbs in the first 6 months to continue with counseling)   ASK: We discussed the diagnosis of obesity with [name] today and Juanda Crumble agreed to give [name] permission to discuss obesity behavioral modification therapy today.  ASSESS: Jentry has the diagnosis of obesity and his BMI today is 39.61 Quantay is in the action stage of change   ADVISE: Orman was educated on the multiple health risks of obesity as well as the benefit of weight loss to improve his health. He was advised of the need for long term treatment and the importance of lifestyle modifications.  AGREE: Multiple dietary modification options and treatment options were  discussed and  Sumner agreed to the above obesity treatment plan.  I, Trixie Dredge, am acting as transcriptionist for Dennard Nip, MD  I have reviewed the above  documentation for accuracy and completeness, and I agree with the above. -Dennard Nip, MD

## 2018-06-08 LAB — CBC WITH DIFFERENTIAL
BASOS: 0 %
Basophils Absolute: 0 10*3/uL (ref 0.0–0.2)
EOS (ABSOLUTE): 0.2 10*3/uL (ref 0.0–0.4)
EOS: 3 %
Hematocrit: 47.3 % (ref 37.5–51.0)
Hemoglobin: 15.8 g/dL (ref 13.0–17.7)
IMMATURE GRANS (ABS): 0 10*3/uL (ref 0.0–0.1)
IMMATURE GRANULOCYTES: 0 %
LYMPHS: 33 %
Lymphocytes Absolute: 2.2 10*3/uL (ref 0.7–3.1)
MCH: 28.9 pg (ref 26.6–33.0)
MCHC: 33.4 g/dL (ref 31.5–35.7)
MCV: 87 fL (ref 79–97)
MONOS ABS: 0.5 10*3/uL (ref 0.1–0.9)
Monocytes: 7 %
NEUTROS PCT: 57 %
Neutrophils Absolute: 3.8 10*3/uL (ref 1.4–7.0)
RBC: 5.46 x10E6/uL (ref 4.14–5.80)
RDW: 14.4 % (ref 12.3–15.4)
WBC: 6.7 10*3/uL (ref 3.4–10.8)

## 2018-06-08 LAB — COMPREHENSIVE METABOLIC PANEL
A/G RATIO: 1.2 (ref 1.2–2.2)
ALT: 28 IU/L (ref 0–44)
AST: 27 IU/L (ref 0–40)
Albumin: 3.9 g/dL (ref 3.5–5.5)
Alkaline Phosphatase: 47 IU/L (ref 39–117)
BUN/Creatinine Ratio: 16 (ref 9–20)
BUN: 15 mg/dL (ref 6–24)
Bilirubin Total: 0.6 mg/dL (ref 0.0–1.2)
CALCIUM: 9.5 mg/dL (ref 8.7–10.2)
CO2: 27 mmol/L (ref 20–29)
CREATININE: 0.92 mg/dL (ref 0.76–1.27)
Chloride: 98 mmol/L (ref 96–106)
GFR, EST AFRICAN AMERICAN: 108 mL/min/{1.73_m2} (ref >59)
GFR, EST NON AFRICAN AMERICAN: 93 mL/min/{1.73_m2} (ref >59)
Globulin, Total: 3.2 g/dL (ref 1.5–4.5)
Glucose: 79 mg/dL (ref 65–99)
Potassium: 3.6 mmol/L (ref 3.5–5.2)
Sodium: 139 mmol/L (ref 134–144)
TOTAL PROTEIN: 7.1 g/dL (ref 6.0–8.5)

## 2018-06-08 LAB — T4, FREE: FREE T4: 1.5 ng/dL (ref 0.82–1.77)

## 2018-06-08 LAB — LIPID PANEL WITH LDL/HDL RATIO
Cholesterol, Total: 216 mg/dL — ABNORMAL HIGH (ref 100–199)
HDL: 43 mg/dL (ref >39)
LDL CALC: 157 mg/dL — AB (ref 0–99)
LDL/HDL RATIO: 3.7 ratio — AB (ref 0.0–3.6)
Triglycerides: 78 mg/dL (ref 0–149)
VLDL CHOLESTEROL CAL: 16 mg/dL (ref 5–40)

## 2018-06-08 LAB — VITAMIN B12: VITAMIN B 12: 1102 pg/mL (ref 232–1245)

## 2018-06-08 LAB — HEMOGLOBIN A1C
Est. average glucose Bld gHb Est-mCnc: 103 mg/dL
Hgb A1c MFr Bld: 5.2 % (ref 4.8–5.6)

## 2018-06-08 LAB — FOLATE: FOLATE: 11.9 ng/mL (ref >3.0)

## 2018-06-08 LAB — T3: T3 TOTAL: 89 ng/dL (ref 71–180)

## 2018-06-08 LAB — INSULIN, RANDOM: INSULIN: 7.2 u[IU]/mL (ref 2.6–24.9)

## 2018-06-08 LAB — VITAMIN D 25 HYDROXY (VIT D DEFICIENCY, FRACTURES): Vit D, 25-Hydroxy: 25.8 ng/mL — ABNORMAL LOW (ref 30.0–100.0)

## 2018-06-08 LAB — TSH: TSH: 0.826 u[IU]/mL (ref 0.450–4.500)

## 2018-06-28 ENCOUNTER — Ambulatory Visit (INDEPENDENT_AMBULATORY_CARE_PROVIDER_SITE_OTHER): Payer: Managed Care, Other (non HMO) | Admitting: Family Medicine

## 2018-06-28 VITALS — BP 109/68 | HR 59 | Temp 98.1°F | Ht 65.0 in | Wt 234.0 lb

## 2018-06-28 DIAGNOSIS — Z6839 Body mass index (BMI) 39.0-39.9, adult: Secondary | ICD-10-CM

## 2018-06-28 DIAGNOSIS — M19012 Primary osteoarthritis, left shoulder: Secondary | ICD-10-CM | POA: Diagnosis not present

## 2018-06-28 DIAGNOSIS — Z9189 Other specified personal risk factors, not elsewhere classified: Secondary | ICD-10-CM | POA: Diagnosis not present

## 2018-06-28 DIAGNOSIS — M722 Plantar fascial fibromatosis: Secondary | ICD-10-CM | POA: Diagnosis not present

## 2018-06-28 DIAGNOSIS — E559 Vitamin D deficiency, unspecified: Secondary | ICD-10-CM | POA: Diagnosis not present

## 2018-06-28 MED ORDER — MELOXICAM 15 MG PO TABS: 15.0000 mg | ORAL_TABLET | Freq: Every day | ORAL | 0 refills | Status: DC

## 2018-06-28 MED ORDER — VITAMIN D (ERGOCALCIFEROL) 1.25 MG (50000 UNIT) PO CAPS: 50000.0000 [IU] | ORAL_CAPSULE | ORAL | 0 refills | Status: DC

## 2018-06-28 NOTE — Progress Notes (Signed)
Office: [phone]  /  Fax: [phone]   HPI:   Chief Complaint: OBESITY Bryan Lee is here to discuss his progress with his obesity treatment plan. He is on the lower carbohydrate, vegetable and lean protein rich diet plan and is following his eating plan approximately 98 % of the time. He states he is doing weights and cardio for 30 to 40 minutes 3 times per week. Bryan Lee is doing well on the low carbohydrate plan. Hunger is controlled and he is doing well with planning ahead. His weight is 234 lb (106.1 kg) today and has had a weight loss of 4 pounds over a period of 3 weeks since his last visit. He has lost 60 lbs since starting treatment with [name].  Vitamin D deficiency [name] has a diagnosis of vitamin D deficiency. He is stable on vit D but is not yet at goal. [name] denies nausea, vomiting or muscle weakness.  At risk for osteopenia and osteoporosis [name] is at higher risk of osteopenia and osteoporosis due to vitamin D deficiency.   Osteoarthritis Left Shoulder [name] saw ortho, and was told, he will likely need a replacement in five years. [name] notes decreased pain on Mobic.  ALLERGIES: Allergies  Allergen Reactions  . Lactose Intolerance (Gi) Other (See Comments)    Cramping and gas pain    MEDICATIONS: Current Outpatient Medications on File Prior to Visit  Medication Sig Dispense Refill  . metFORMIN (GLUCOPHAGE) 500 MG tablet Take 1 tablet (500 mg total) by mouth 2 (two) times daily with a meal. 60 tablet 0  . omega-3 acid ethyl esters (LOVAZA) 1 g capsule Take 2 g by mouth 2 (two) times daily.    . TURMERIC PO Take 2 capsules by mouth daily.     No current facility-administered medications on file prior to visit.     PAST MEDICAL HISTORY: Past Medical History:  Diagnosis Date  . Achilles rupture   . Acid reflux   . Back pain   . Carpal tunnel syndrome   . Chest pain   . Constipation   . Dizzy spells   . Joint pain   . Lactose intolerance   .  Lipoma   . Numbness    legs, hands  . Obesity   . Plantar fasciitis   . Shoulder pain    rotator cuff  . SOB (shortness of breath) on exertion     PAST SURGICAL HISTORY: Past Surgical History:  Procedure Laterality Date  . ACHILLES TENDON REPAIR     left  . HERNIA REPAIR    . lump removal    . VASECTOMY      SOCIAL HISTORY: Social History   Tobacco Use  . Smoking status: Never Smoker  . Smokeless tobacco: Never Used  Substance Use Topics  . Alcohol use: Yes    Comment: social  . Drug use: No    FAMILY HISTORY: Family History  Problem Relation Age of Onset  . Coronary artery disease Father   . Heart failure Father   . Hypertension Father   . Sleep apnea Father   . Heart disease Father   . Arthritis Mother   . Obesity Mother   . Diabetes Unknown        grandmother  . Cancer Sister        GYN-type cancer  . GER disease Daughter   . GER disease Son   . Diabetes Maternal Grandmother   . Cancer Maternal Grandmother  ovarian  . Cancer Paternal Grandmother        lung cancer, + tobacco and colon  . Seizures Sister     ROS: Review of Systems  Constitutional: Positive for weight loss.  Gastrointestinal: Negative for nausea and vomiting.  Musculoskeletal:       Negative for muscle weakness    PHYSICAL EXAM: Blood pressure 109/68, pulse (!) 59, temperature 98.1 F (36.7 C), temperature source Oral, height 5\' 5"  (1.651 m), weight 234 lb (106.1 kg), SpO2 97 %. Body mass index is 38.94 kg/m. Physical Exam  Constitutional: He is oriented to person, place, and time. He appears well-developed and well-nourished.  Cardiovascular: Normal rate.  Pulmonary/Chest: Effort normal.  Musculoskeletal: Normal range of motion.  Neurological: He is oriented to person, place, and time.  Skin: Skin is warm and dry.  Psychiatric: He has a normal mood and affect. His behavior is normal.  Vitals reviewed.   RECENT LABS AND TESTS: BMET    Component Value Date/Time     NA 139 06/07/2018 0758   K 3.6 06/07/2018 0758   CL 98 06/07/2018 0758   CO2 27 06/07/2018 0758   GLUCOSE 79 06/07/2018 0758   GLUCOSE 123 (H) 11/07/2015 0901   BUN 15 06/07/2018 0758   CREATININE 0.92 06/07/2018 0758   CREATININE 0.89 09/08/2013 1631   CALCIUM 9.5 06/07/2018 0758   GFRNONAA 93 06/07/2018 0758   GFRAA 108 06/07/2018 0758   Lab Results  Component Value Date   HGBA1C 5.2 06/07/2018   HGBA1C 5.3 02/03/2018   HGBA1C 5.3 10/18/2017   HGBA1C 5.3 06/17/2017   HGBA1C 5.9 (H) 03/04/2017   Lab Results  Component Value Date   INSULIN 7.2 06/07/2018   INSULIN 5.6 02/03/2018   INSULIN 10.1 10/18/2017   INSULIN 9.7 06/17/2017   INSULIN 16.2 03/04/2017   CBC    Component Value Date/Time   WBC 6.7 06/07/2018 0758   WBC 8.8 11/07/2015 0901   RBC 5.46 06/07/2018 0758   RBC 5.54 11/07/2015 0901   HGB 15.8 06/07/2018 0758   HCT 47.3 06/07/2018 0758   PLT 348 03/04/2017 1145   MCV 87 06/07/2018 0758   MCH 28.9 06/07/2018 0758   MCH 27.6 09/08/2013 1631   MCHC 33.4 06/07/2018 0758   MCHC 33.1 11/07/2015 0901   RDW 14.4 06/07/2018 0758   LYMPHSABS 2.2 06/07/2018 0758   MONOABS 0.5 11/07/2015 0901   EOSABS 0.2 06/07/2018 0758   BASOSABS 0.0 06/07/2018 0758   Iron/TIBC/Ferritin/ %Sat No results found for: IRON, TIBC, FERRITIN, IRONPCTSAT Lipid Panel     Component Value Date/Time   CHOL 216 (H) 06/07/2018 0758   TRIG 78 06/07/2018 0758   HDL 43 06/07/2018 0758   CHOLHDL 6 11/07/2015 0901   VLDL 36.6 11/07/2015 0901   LDLCALC 157 (H) 06/07/2018 0758   Hepatic Function Panel     Component Value Date/Time   PROT 7.1 06/07/2018 0758   ALBUMIN 3.9 06/07/2018 0758   AST 27 06/07/2018 0758   ALT 28 06/07/2018 0758   ALKPHOS 47 06/07/2018 0758   BILITOT 0.6 06/07/2018 0758   BILIDIR 0.1 11/07/2015 0901   IBILI 0.3 09/08/2013 1631      Component Value Date/Time   TSH 0.826 06/07/2018 0758   TSH 0.735 03/04/2017 1145   TSH 0.96 11/07/2015 0901   Results  for TAVARIOUS, FREEL (MRN [id number]) as of 06/28/2018 08:27  Ref. Range 06/07/2018 07:58  Vitamin D, 25-Hydroxy Latest Ref Range: 30.0 - 100.0 ng/mL 25.8 (  L)   ASSESSMENT AND PLAN: Vitamin D deficiency - Plan: Vitamin D, Ergocalciferol, (DRISDOL) 50000 units CAPS capsule  Primary osteoarthritis of left shoulder  At risk for osteoporosis  Class 2 severe obesity with serious comorbidity and body mass index (BMI) of 39.0 to 39.9 in adult, unspecified obesity type (HCC)  Plantar fasciitis - Plan: meloxicam (MOBIC) 15 MG tablet  PLAN:  Vitamin D Deficiency Bryan Lee was informed that low vitamin D levels contributes to fatigue and are associated with obesity, breast, and colon cancer. He agrees to continue to take prescription Vit D @50 ,000 IU every week #4 with no refills and take OTC vit D3 2,000 IU daily. We will recheck labs in 3 months and he will follow up for routine testing of vitamin D, at least 2-3 times per year. He was informed of the risk of over-replacement of vitamin D and agrees to not increase his dose unless he discusses this with [name] first. Bryan Lee agrees to follow up as directed.  At risk for osteopenia and osteoporosis Bryan Lee is at risk for osteopenia and osteoporosis due to his vitamin D deficiency. He was encouraged to take his vitamin D and follow his higher calcium diet and increase strengthening exercise to help strengthen his bones and decrease his risk of osteopenia and osteoporosis.  Osteoarthritis Left Shoulder Bryan Lee agrees to continue Mobic 15 mg daily #30 with no refills and continue exercises as discussed. Bryan Lee agrees to follow up with our clinic in 4 weeks.  Obesity Bryan Lee is currently in the action stage of change. As such, his goal is to continue with weight loss efforts He has agreed to follow a lower carbohydrate, vegetable and lean protein rich diet plan Bryan Lee has been instructed to work up to a goal of 150 minutes of combined cardio and  strengthening exercise per week for weight loss and overall health benefits. We discussed the following Behavioral Modification Strategies today: increase H2O intake, increasing lean protein intake, increasing vegetables and work on meal planning and easy cooking plans  Bryan Lee has agreed to follow up with our clinic in 4 weeks. He was informed of the importance of frequent follow up visits to maximize his success with intensive lifestyle modifications for his multiple health conditions.   OBESITY BEHAVIORAL INTERVENTION VISIT  Today's visit was # 21 out of 22.  Starting weight: 294 lbs Starting date: 03/04/17 Today's weight : 234 lbs  Today's date: 06/28/2018 Total lbs lost to date: 54 (Patients must lose 7 lbs in the first 6 months to continue with counseling)   ASK: We discussed the diagnosis of obesity with Bryan Lee today and Bryan Lee agreed to give [name] permission to discuss obesity behavioral modification therapy today.  ASSESS: Bryan Lee has the diagnosis of obesity and his BMI today is 38.94 Kden is in the action stage of change   ADVISE: Bryan Lee was educated on the multiple health risks of obesity as well as the benefit of weight loss to improve his health. He was advised of the need for long term treatment and the importance of lifestyle modifications.  AGREE: Multiple dietary modification options and treatment options were discussed and  Tevan agreed to the above obesity treatment plan.  I, Doreene Nest, am acting as transcriptionist for Dennard Nip, MD  I have reviewed the above documentation for accuracy and completeness, and I agree with the above. -Dennard Nip, MD

## 2018-07-15 ENCOUNTER — Encounter: Payer: Self-pay | Admitting: Family Medicine

## 2018-07-19 ENCOUNTER — Encounter: Payer: Self-pay | Admitting: Family Medicine

## 2018-07-25 ENCOUNTER — Other Ambulatory Visit (INDEPENDENT_AMBULATORY_CARE_PROVIDER_SITE_OTHER): Payer: Self-pay | Admitting: Family Medicine

## 2018-07-25 DIAGNOSIS — E559 Vitamin D deficiency, unspecified: Secondary | ICD-10-CM

## 2018-07-26 ENCOUNTER — Ambulatory Visit (INDEPENDENT_AMBULATORY_CARE_PROVIDER_SITE_OTHER): Payer: Managed Care, Other (non HMO) | Admitting: Family Medicine

## 2018-07-26 VITALS — BP 116/62 | HR 62 | Temp 98.3°F | Ht 65.0 in | Wt 235.0 lb

## 2018-07-26 DIAGNOSIS — Z9189 Other specified personal risk factors, not elsewhere classified: Secondary | ICD-10-CM | POA: Diagnosis not present

## 2018-07-26 DIAGNOSIS — Z6839 Body mass index (BMI) 39.0-39.9, adult: Secondary | ICD-10-CM

## 2018-07-26 DIAGNOSIS — R7303 Prediabetes: Secondary | ICD-10-CM | POA: Diagnosis not present

## 2018-07-26 DIAGNOSIS — E559 Vitamin D deficiency, unspecified: Secondary | ICD-10-CM | POA: Diagnosis not present

## 2018-07-26 MED ORDER — METFORMIN HCL 500 MG PO TABS: 500.0000 mg | ORAL_TABLET | Freq: Two times a day (BID) | ORAL | 0 refills | Status: DC

## 2018-07-26 MED ORDER — VITAMIN D (ERGOCALCIFEROL) 1.25 MG (50000 UNIT) PO CAPS: 50000.0000 [IU] | ORAL_CAPSULE | ORAL | 0 refills | Status: DC

## 2018-07-27 NOTE — Progress Notes (Signed)
Office: [phone]  /  Fax: [phone]   HPI:   Chief Complaint: OBESITY Bryan Lee is here to discuss his progress with his obesity treatment plan. He is on the lower carbohydrate, vegetable and lean protein rich diet plan and is following his eating plan approximately 98 % of the time. He states he is lifting weights 20 minutes 3 times per week. Bryan Lee is doing well maintaining his weight. He continues to lose fat, but is gaining muscle. He requests a prescription for a personal trainer, so he can use his [employer]SA money. His weight is 235 lb (106.6 kg) today and has had a weight gain of 1 pound over a period of 4 weeks since his last visit. He has lost 59 lbs since starting treatment with [name].  Pre-Diabetes Bryan Lee has a diagnosis of prediabetes based on his elevated Hgb A1c and was informed this puts him at greater risk of developing diabetes. He is taking metformin currently and continues to work on diet and exercise to decrease risk of diabetes. He denies nausea, vomiting or hypoglycemia.  At risk for diabetes Bryan Lee is at higher than average risk for developing diabetes due to his obesity and pre-diabetes . He currently denies polyuria or polydipsia.  Vitamin D deficiency Bryan Lee has a diagnosis of vitamin D deficiency. He is stable on vit D but is not yet at goal. Bryan Lee denies nausea, vomiting or muscle weakness.  ALLERGIES: Allergies  Allergen Reactions  . Lactose Intolerance (Gi) Other (See Comments)    Cramping and gas pain    MEDICATIONS: Current Outpatient Medications on File Prior to Visit  Medication Sig Dispense Refill  . meloxicam (MOBIC) 15 MG tablet Take 1 tablet (15 mg total) by mouth daily. 30 tablet 0  . omega-3 acid ethyl esters (LOVAZA) 1 g capsule Take 2 g by mouth 2 (two) times daily.    . TURMERIC PO Take 2 capsules by mouth daily.     No current facility-administered medications on file prior to visit.     PAST MEDICAL HISTORY: Past Medical History:    Diagnosis Date  . Achilles rupture   . Acid reflux   . Back pain   . Carpal tunnel syndrome   . Chest pain   . Constipation   . Dizzy spells   . Joint pain   . Lactose intolerance   . Lipoma   . Numbness    legs, hands  . Obesity   . Plantar fasciitis   . Shoulder pain    rotator cuff  . SOB (shortness of breath) on exertion     PAST SURGICAL HISTORY: Past Surgical History:  Procedure Laterality Date  . ACHILLES TENDON REPAIR     left  . HERNIA REPAIR    . lump removal    . VASECTOMY      SOCIAL HISTORY: Social History   Tobacco Use  . Smoking status: Never Smoker  . Smokeless tobacco: Never Used  Substance Use Topics  . Alcohol use: Yes    Comment: social  . Drug use: No    FAMILY HISTORY: Family History  Problem Relation Age of Onset  . Coronary artery disease Father   . Heart failure Father   . Hypertension Father   . Sleep apnea Father   . Heart disease Father   . Arthritis Mother   . Obesity Mother   . Diabetes Unknown        grandmother  . Cancer Sister  GYN-type cancer  . GER disease Daughter   . GER disease Son   . Diabetes Maternal Grandmother   . Cancer Maternal Grandmother        ovarian  . Cancer Paternal Grandmother        lung cancer, + tobacco and colon  . Seizures Sister     ROS: Review of Systems  Constitutional: Negative for weight loss.  Gastrointestinal: Negative for nausea and vomiting.  Genitourinary: Negative for frequency.  Musculoskeletal:       Negative for muscle weakness  Endo/Heme/Allergies: Negative for polydipsia.       Negative for hypoglycemia    PHYSICAL EXAM: Blood pressure 116/62, pulse 62, temperature 98.3 F (36.8 C), temperature source Oral, height 5\' 5"  (1.651 m), weight 235 lb (106.6 kg), SpO2 95 %. Body mass index is 39.11 kg/m. Physical Exam  Constitutional: He is oriented to person, place, and time. He appears well-developed and well-nourished.  Cardiovascular: Normal rate.   Pulmonary/Chest: Effort normal.  Musculoskeletal: Normal range of motion.  Neurological: He is oriented to person, place, and time.  Skin: Skin is warm and dry.  Psychiatric: He has a normal mood and affect. His behavior is normal.  Vitals reviewed.   RECENT LABS AND TESTS: BMET    Component Value Date/Time   NA 139 06/07/2018 0758   K 3.6 06/07/2018 0758   CL 98 06/07/2018 0758   CO2 27 06/07/2018 0758   GLUCOSE 79 06/07/2018 0758   GLUCOSE 123 (H) 11/07/2015 0901   BUN 15 06/07/2018 0758   CREATININE 0.92 06/07/2018 0758   CREATININE 0.89 09/08/2013 1631   CALCIUM 9.5 06/07/2018 0758   GFRNONAA 93 06/07/2018 0758   GFRAA 108 06/07/2018 0758   Lab Results  Component Value Date   HGBA1C 5.2 06/07/2018   HGBA1C 5.3 02/03/2018   HGBA1C 5.3 10/18/2017   HGBA1C 5.3 06/17/2017   HGBA1C 5.9 (H) 03/04/2017   Lab Results  Component Value Date   INSULIN 7.2 06/07/2018   INSULIN 5.6 02/03/2018   INSULIN 10.1 10/18/2017   INSULIN 9.7 06/17/2017   INSULIN 16.2 03/04/2017   CBC    Component Value Date/Time   WBC 6.7 06/07/2018 0758   WBC 8.8 11/07/2015 0901   RBC 5.46 06/07/2018 0758   RBC 5.54 11/07/2015 0901   HGB 15.8 06/07/2018 0758   HCT 47.3 06/07/2018 0758   PLT 348 03/04/2017 1145   MCV 87 06/07/2018 0758   MCH 28.9 06/07/2018 0758   MCH 27.6 09/08/2013 1631   MCHC 33.4 06/07/2018 0758   MCHC 33.1 11/07/2015 0901   RDW 14.4 06/07/2018 0758   LYMPHSABS 2.2 06/07/2018 0758   MONOABS 0.5 11/07/2015 0901   EOSABS 0.2 06/07/2018 0758   BASOSABS 0.0 06/07/2018 0758   Iron/TIBC/Ferritin/ %Sat No results found for: IRON, TIBC, FERRITIN, IRONPCTSAT Lipid Panel     Component Value Date/Time   CHOL 216 (H) 06/07/2018 0758   TRIG 78 06/07/2018 0758   HDL 43 06/07/2018 0758   CHOLHDL 6 11/07/2015 0901   VLDL 36.6 11/07/2015 0901   LDLCALC 157 (H) 06/07/2018 0758   Hepatic Function Panel     Component Value Date/Time   PROT 7.1 06/07/2018 0758   ALBUMIN  3.9 06/07/2018 0758   AST 27 06/07/2018 0758   ALT 28 06/07/2018 0758   ALKPHOS 47 06/07/2018 0758   BILITOT 0.6 06/07/2018 0758   BILIDIR 0.1 11/07/2015 0901   IBILI 0.3 09/08/2013 1631      Component Value  Date/Time   TSH 0.826 06/07/2018 0758   TSH 0.735 03/04/2017 1145   TSH 0.96 11/07/2015 0901   Results for GOVANI, RADLOFF (MRN [id number]) as of 07/27/2018 10:50  Ref. Range 06/07/2018 07:58  Vitamin D, 25-Hydroxy Latest Ref Range: 30.0 - 100.0 ng/mL 25.8 (L)   ASSESSMENT AND PLAN: Prediabetes - Plan: metFORMIN (GLUCOPHAGE) 500 MG tablet  Vitamin D deficiency - Plan: Vitamin D, Ergocalciferol, (DRISDOL) 50000 units CAPS capsule  At risk for diabetes mellitus  Class 2 severe obesity with serious comorbidity and body mass index (BMI) of 39.0 to 39.9 in adult, unspecified obesity type [facility])  PLAN:  Pre-Diabetes Easton will continue to work on weight loss, exercise, and decreasing simple carbohydrates in his diet to help decrease the risk of diabetes. We dicussed metformin including benefits and risks. He was informed that eating too many simple carbohydrates or too many calories at one sitting increases the likelihood of GI side effects. Abrahm requested metformin for now and a prescription was written today for 1 month refill. Corran agreed to follow up with [name] as directed to monitor his progress.  Diabetes risk counseling Kayman was given extended (15 minutes) diabetes prevention counseling today. He is 57 y.o. male and has risk factors for diabetes including obesity and pre-diabetes. We discussed intensive lifestyle modifications today with an emphasis on weight loss as well as increasing exercise and decreasing simple carbohydrates in his diet.  Vitamin D Deficiency Vedant was informed that low vitamin D levels contributes to fatigue and are associated with obesity, breast, and colon cancer. He agrees to continue to take prescription Vit D @50 ,000 IU every week #4 with  no refills and will follow up for routine testing of vitamin D, at least 2-3 times per year. He was informed of the risk of over-replacement of vitamin D and agrees to not increase his dose unless he discusses this with [name] first. Hensley agrees to follow up as directed.  Obesity Mikiah is currently in the action stage of change. As such, his goal is to continue with weight loss efforts He has agreed to follow a lower carbohydrate, vegetable and lean protein rich diet plan Nekoda has been instructed to work up to a goal of 150 minutes of combined cardio and strengthening exercise per week for weight loss and overall health benefits. We discussed the following Behavioral Modification Strategies today: better snacking choices, increasing lean protein intake and emotional eating strategies  Prescription was written for personal trainer 2 to 3 times per week.  Roddy has agreed to follow up with our clinic in 4 weeks. He was informed of the importance of frequent follow up visits to maximize his success with intensive lifestyle modifications for his multiple health conditions.   OBESITY BEHAVIORAL INTERVENTION VISIT  Today's visit was # 22 out of 22.  Starting weight: 294 lbs Starting date: 03/04/17 Today's weight : 235 lbs Today's date: 07/26/2018 Total lbs lost to date: 48    ASK: We discussed the diagnosis of obesity with [name] today and Juanda Crumble agreed to give [name] permission to discuss obesity behavioral modification therapy today.  ASSESS: Roddie has the diagnosis of obesity and his BMI today is 39.11 Zyier is in the action stage of change   ADVISE: Dequante was educated on the multiple health risks of obesity as well as the benefit of weight loss to improve his health. He was advised of the need for long term treatment and the importance of lifestyle modifications.  AGREE: Multiple  dietary modification options and treatment options were discussed and  Jaskaran agreed to  the above obesity treatment plan.  I, Doreene Nest, am acting as transcriptionist for Dennard Nip, MD  I have reviewed the above documentation for accuracy and completeness, and I agree with the above. -Dennard Nip, MD

## 2018-08-02 ENCOUNTER — Other Ambulatory Visit (INDEPENDENT_AMBULATORY_CARE_PROVIDER_SITE_OTHER): Payer: Self-pay | Admitting: Family Medicine

## 2018-08-02 DIAGNOSIS — M722 Plantar fascial fibromatosis: Secondary | ICD-10-CM

## 2018-08-05 ENCOUNTER — Encounter: Payer: Self-pay | Admitting: Family Medicine

## 2018-08-05 ENCOUNTER — Ambulatory Visit (INDEPENDENT_AMBULATORY_CARE_PROVIDER_SITE_OTHER): Payer: Managed Care, Other (non HMO) | Admitting: Family Medicine

## 2018-08-05 ENCOUNTER — Other Ambulatory Visit: Payer: Self-pay

## 2018-08-05 VITALS — BP 127/79 | HR 70 | Temp 98.6°F | Resp 16 | Ht 65.0 in | Wt 246.2 lb

## 2018-08-05 DIAGNOSIS — E6609 Other obesity due to excess calories: Secondary | ICD-10-CM

## 2018-08-05 DIAGNOSIS — G8929 Other chronic pain: Secondary | ICD-10-CM

## 2018-08-05 DIAGNOSIS — Z136 Encounter for screening for cardiovascular disorders: Secondary | ICD-10-CM

## 2018-08-05 DIAGNOSIS — R7303 Prediabetes: Secondary | ICD-10-CM

## 2018-08-05 DIAGNOSIS — Z0001 Encounter for general adult medical examination with abnormal findings: Secondary | ICD-10-CM | POA: Diagnosis not present

## 2018-08-05 DIAGNOSIS — N401 Enlarged prostate with lower urinary tract symptoms: Secondary | ICD-10-CM

## 2018-08-05 DIAGNOSIS — Z1389 Encounter for screening for other disorder: Secondary | ICD-10-CM | POA: Diagnosis not present

## 2018-08-05 DIAGNOSIS — Z1383 Encounter for screening for respiratory disorder NEC: Secondary | ICD-10-CM | POA: Diagnosis not present

## 2018-08-05 DIAGNOSIS — R350 Frequency of micturition: Secondary | ICD-10-CM

## 2018-08-05 DIAGNOSIS — M25512 Pain in left shoulder: Secondary | ICD-10-CM

## 2018-08-05 DIAGNOSIS — M19012 Primary osteoarthritis, left shoulder: Secondary | ICD-10-CM

## 2018-08-05 DIAGNOSIS — E7849 Other hyperlipidemia: Secondary | ICD-10-CM

## 2018-08-05 DIAGNOSIS — E559 Vitamin D deficiency, unspecified: Secondary | ICD-10-CM

## 2018-08-05 DIAGNOSIS — B353 Tinea pedis: Secondary | ICD-10-CM

## 2018-08-05 DIAGNOSIS — Z0000 Encounter for general adult medical examination without abnormal findings: Secondary | ICD-10-CM

## 2018-08-05 DIAGNOSIS — R011 Cardiac murmur, unspecified: Secondary | ICD-10-CM

## 2018-08-05 DIAGNOSIS — Z6839 Body mass index (BMI) 39.0-39.9, adult: Secondary | ICD-10-CM

## 2018-08-05 LAB — POCT URINALYSIS DIP (MANUAL ENTRY)
Bilirubin, UA: NEGATIVE
Glucose, UA: NEGATIVE mg/dL
Ketones, POC UA: NEGATIVE mg/dL
Leukocytes, UA: NEGATIVE
NITRITE UA: NEGATIVE
PH UA: 6 (ref 5.0–8.0)
Protein Ur, POC: NEGATIVE mg/dL
SPEC GRAV UA: 1.01 (ref 1.010–1.025)
UROBILINOGEN UA: 0.2 U/dL

## 2018-08-05 MED ORDER — CELECOXIB 200 MG PO CAPS: 200.0000 mg | ORAL_CAPSULE | Freq: Every day | ORAL | 0 refills | Status: DC

## 2018-08-05 MED ORDER — KETOCONAZOLE 2 % EX CREA: 1.0000 "application " | TOPICAL_CREAM | Freq: Every day | CUTANEOUS | 2 refills | Status: DC

## 2018-08-05 MED ORDER — ZOSTER VAC RECOMB ADJUVANTED 50 MCG/0.5ML IM SUSR: 0.5000 mL | Freq: Once | INTRAMUSCULAR | 1 refills | Status: AC

## 2018-08-05 MED ORDER — TAMSULOSIN HCL 0.4 MG PO CAPS: 0.4000 mg | ORAL_CAPSULE | Freq: Every day | ORAL | 3 refills | Status: DC

## 2018-08-05 NOTE — Patient Instructions (Addendum)
Start vitamin D 4000u for x 3 wks and then decrease to 2000u a day vitamin D supplement indefinitely after that.   Dr. Niel Hummer at [facility]. [email or web address]  IF you received an x-ray today, you will receive an invoice from [facility]. Please contact [facility] at [phone] with questions or concerns regarding your invoice.   IF you received labwork today, you will receive an invoice from [facility]. Please contact [facility] at [phone] with questions or concerns regarding your invoice.   Our billing staff will not be able to assist you with questions regarding bills from these companies.  You will be contacted with the lab results as soon as they are available. The fastest way to get your results is to activate your My Chart account. Instructions are located on the last page of this paperwork. If you have not heard from [name] regarding the results in 2 weeks, please contact this office.     Heart Murmur A heart murmur is an extra sound that is caused by chaotic blood flow. The murmur can be heard as a "hum" or "whoosh" sound when blood flows through the heart. The heart has four areas called chambers. Valves separate the upper and lower chambers from each other (tricuspid valve and mitral valve) and separate the lower chambers of the heart from pathways that lead away from the heart (aortic valve and pulmonary valve). Normally, the valves open to let blood flow through or out of your heart, and then they shut to keep the blood from flowing backward. There are two types of heart murmurs:  Innocent murmurs. Most people with this type of heart murmur do not have a heart problem. Many children have innocent heart murmurs. Your health care provider may suggest some basic testing to find out whether your murmur is an innocent murmur. If an innocent heart murmur is found, there is no need for further tests or treatment and no need to  restrict activities or stop playing sports.  Abnormal murmurs. These types of murmurs can occur in children and adults. Abnormal murmurs may be a sign of a more serious heart condition, such as a heart defect present at birth (congenital defect) or heart valve disease.  What are the causes? This condition is caused by heart valves that are not working properly. In children, abnormal heart murmurs are typically caused by congenital defects. In adults, abnormal murmurs are usually from heart valve problems caused by disease, infection, or aging. Three types of heart valve defects can cause a murmur:  Regurgitation. This is when blood leaks back through the valve in the wrong direction.  Mitral valve prolapse. This is when the mitral valve of the heart has a loose flap and does not close tightly.  Stenosis. This is when a valve does not open enough and blocks blood flow.  This condition may also be caused by:  Pregnancy.  Fever.  Overactive thyroid gland.  Anemia.  Exercise.  Rapid growth spurts (in children).  What are the signs or symptoms? Innocent murmurs do not cause symptoms, and many people with abnormal murmurs may or may not have symptoms. If symptoms do develop, they may include:  Shortness of breath.  Blue coloring of the skin, especially on the fingertips.  Chest pain.  Palpitations, or feeling a fluttering or skipped heartbeat.  Fainting.  Persistent cough.  Getting tired much faster than expected.  Swelling in the abdomen, feet, or ankles.  How is this diagnosed? This condition may be diagnosed during  a routine physical or other exam. If your health care provider hears a murmur with a stethoscope, he or she will listen for:  Where the murmur is located in your heart.  How long the murmur lasts (duration).  When the murmur is heard during the heartbeat.  How loud the murmur is. This may help the health care provider figure out what is causing the  murmur.  You may be referred to a heart specialist (cardiologist). You may also have other tests, including:  Electrocardiogram (ECG or EKG). This test measures the electrical activity of your heart.  Echocardiogram. This test uses high frequency sound waves to make pictures of your heart.  MRI or chest X-ray.  Cardiac catheterization. This test looks at blood flow through the heart.  For children and adults who have an abnormal heart murmur and want to stay active, it is important to complete testing, review test results, and receive recommendations from your health care provider. If heart disease is present, it may not be safe to play or be active. How is this treated? Heart murmurs themselves do not need treatment. In some cases, a heart murmur may go away on its own. If an underlying problem or disease is causing the murmur, you may need treatment. If treatment is needed, it will depend on the type and severity of the disease or heart problem causing the murmur. Treatment may include:  Medicine.  Surgery.  Dietary and lifestyle changes.  Follow these instructions at home:  Talk with your health care provider before participating in sports or other activities that require a lot of effort and energy (are strenuous).  Learn as much as possible about your condition and any related diseases. Ask your health care provider if you may at risk for any medical emergencies.  Talk with your health care provider about what symptoms you should look out for.  It is up to you to get your test results. Ask your health care provider, or the department that is doing the test, when your results will be ready.  Keep all follow-up visits as told by your health care provider. This is important. Contact a health care provider if:  You feel light-headed.  You are frequently short of breath.  You feel more tired than usual.  You are having a hard time keeping up with normal activities or fitness  routines.  You have swelling in your ankles or feet.  You have chest pain.  You notice that your heart often beats irregularly.  You develop any new symptoms. Get help right away if:  You develop severe chest pain.  You are having trouble breathing.  You have fainting spells.  Your symptoms suddenly get worse. These symptoms may represent a serious problem that is an emergency. Do not wait to see if the symptoms will go away. Get medical help right away. Call your local emergency services (911 in the [place] Do not drive yourself to the hospital. Summary  Normally, the heart valves open to let blood flow through or out of your heart, and then they shut to keep the blood from flowing backward.  Heart murmur is caused by heart valves that are not working properly.  You may need treatment if an underlying problem or disease is causing the heart murmur. Treatment may include medicine, surgery, or dietary and lifestyle changes.  Talk with your health care provider before participating in sports or other activities that require a lot of effort and energy (are strenuous).  Talk  with your health care provider about what symptoms you should watch out for. This information is not intended to replace advice given to you by your health care provider. Make sure you discuss any questions you have with your health care provider. Document Released: 01/21/2005 Document Revised: 12/02/2016 Document Reviewed: 12/02/2016 Elsevier Interactive Patient Education  2018 [place].  High Cholesterol High cholesterol is a condition in which the blood has high levels of a white, waxy, fat-like substance (cholesterol). The human body needs small amounts of cholesterol. The liver makes all the cholesterol that the body needs. Extra (excess) cholesterol comes from the food that we eat. Cholesterol is carried from the liver by the blood through the blood vessels. If you have high cholesterol, deposits  (plaques) may build up on the walls of your blood vessels (arteries). Plaques make the arteries narrower and stiffer. Cholesterol plaques increase your risk for heart attack and stroke. Work with your health care provider to keep your cholesterol levels in a healthy range. What increases the risk? This condition is more likely to develop in people who:  Eat foods that are high in animal fat (saturated fat) or cholesterol.  Are overweight.  Are not getting enough exercise.  Have a family history of high cholesterol.  What are the signs or symptoms? There are no symptoms of this condition. How is this diagnosed? This condition may be diagnosed from the results of a blood test.  If you are older than age 47, your health care provider may check your cholesterol every 4-6 years.  You may be checked more often if you already have high cholesterol or other risk factors for heart disease.  The blood test for cholesterol measures:  "Bad" cholesterol (LDL cholesterol). This is the main type of cholesterol that causes heart disease. The desired level for LDL is less than 100.  "Good" cholesterol (HDL cholesterol). This type helps to protect against heart disease by cleaning the arteries and carrying the LDL away. The desired level for HDL is 60 or higher.  Triglycerides. These are fats that the body can store or burn for energy. The desired number for triglycerides is lower than 150.  Total cholesterol. This is a measure of the total amount of cholesterol in your blood, including LDL cholesterol, HDL cholesterol, and triglycerides. A healthy number is less than 200.  How is this treated? This condition is treated with diet changes, lifestyle changes, and medicines. Diet changes  This may include eating more whole grains, fruits, vegetables, nuts, and fish.  This may also include cutting back on red meat and foods that have a lot of added sugar. Lifestyle changes  Changes may include  getting at least 40 minutes of aerobic exercise 3 times a week. Aerobic exercises include walking, biking, and swimming. Aerobic exercise along with a healthy diet can help you maintain a healthy weight.  Changes may also include quitting smoking. Medicines  Medicines are usually given if diet and lifestyle changes have failed to reduce your cholesterol to healthy levels.  Your health care provider may prescribe a statin medicine. Statin medicines have been shown to reduce cholesterol, which can reduce the risk of heart disease. Follow these instructions at home: Eating and drinking  If told by your health care provider:  Eat chicken (without skin), fish, veal, shellfish, ground [place] breast, and round or loin cuts of red meat.  Do not eat fried foods or fatty meats, such as hot dogs and salami.  Eat plenty of fruits,  such as apples.  Eat plenty of vegetables, such as broccoli, potatoes, and carrots.  Eat beans, peas, and lentils.  Eat grains such as barley, rice, couscous, and bulgur wheat.  Eat pasta without cream sauces.  Use skim or nonfat milk, and eat low-fat or nonfat yogurt and cheeses.  Do not eat or drink whole milk, cream, ice cream, egg yolks, or hard cheeses.  Do not eat stick margarine or tub margarines that contain trans fats (also called partially hydrogenated oils).  Do not eat saturated tropical oils, such as coconut oil and palm oil.  Do not eat cakes, cookies, crackers, or other baked goods that contain trans fats.  General instructions  Exercise as directed by your health care provider. Increase your activity level with activities such as gardening, walking, and taking the stairs.  Take over-the-counter and prescription medicines only as told by your health care provider.  Do not use any products that contain nicotine or tobacco, such as cigarettes and e-cigarettes. If you need help quitting, ask your health care provider.  Keep all follow-up visits  as told by your health care provider. This is important. Contact a health care provider if:  You are struggling to maintain a healthy diet or weight.  You need help to start on an exercise program.  You need help to stop smoking. Get help right away if:  You have chest pain.  You have trouble breathing. This information is not intended to replace advice given to you by your health care provider. Make sure you discuss any questions you have with your health care provider. Document Released: 12/14/2005 Document Revised: 07/11/2016 Document Reviewed: 06/13/2016 Elsevier Interactive Patient Education  [employer].

## 2018-08-05 NOTE — Progress Notes (Signed)
Subjective:    Patient ID: Bryan Lee, male    DOB: [birth year], 57 y.o.   MRN: [id number]   Chief Complaint  Patient presents with  . Annual Exam    HPI  Has been seen in [facility]  Primary Preventative Screenings: Prostate Cancer: STI screening:  Colorectal Cancer: Tobacco use/AAA/Lung/EtOH/Illicit substances:  Cardiac: Weight/Blood sugar/Diet/Exercise: BMI Readings from Last 3 Encounters:  07/26/18 39.11 kg/m  06/28/18 38.94 kg/m  06/07/18 39.61 kg/m   Lab Results  Component Value Date   HGBA1C 5.2 06/07/2018   OTC/Vit/Supp/Herbal: Dentist/Optho: Immunizations:  Immunization History  Administered Date(s) Administered  . Influenza,inj,Quad PF,6+ Mos 10/10/2014, 10/10/2015, 10/11/2017  . Influenza-Unspecified 09/27/2016  . Tdap 02/19/2017    Chronic Medical Conditions:      Past Medical History:  Diagnosis Date  . Achilles rupture   . Acid reflux   . Back pain   . Carpal tunnel syndrome   . Chest pain   . Constipation   . Dizzy spells   . Joint pain   . Lactose intolerance   . Lipoma   . Numbness    legs, hands  . Obesity   . Plantar fasciitis   . Shoulder pain    rotator cuff  . SOB (shortness of breath) on exertion    Past Surgical History:  Procedure Laterality Date  . ACHILLES TENDON REPAIR     left  . HERNIA REPAIR    . lump removal    . VASECTOMY     Current Outpatient Medications on File Prior to Visit  Medication Sig Dispense Refill  . meloxicam (MOBIC) 15 MG tablet TAKE 1 TABLET(15 MG) BY MOUTH DAILY 30 tablet 0  . metFORMIN (GLUCOPHAGE) 500 MG tablet Take 1 tablet (500 mg total) by mouth 2 (two) times daily with a meal. 60 tablet 0  . omega-3 acid ethyl esters (LOVAZA) 1 g capsule Take 2 g by mouth 2 (two) times daily.    . TURMERIC PO Take 2 capsules by mouth daily.    . Vitamin D, Ergocalciferol, (DRISDOL) 50000 units CAPS capsule Take 1 capsule (50,000 Units total) by mouth every 7 (seven) days. 4 capsule 0    No current facility-administered medications on file prior to visit.    Allergies  Allergen Reactions  . Lactose Intolerance (Gi) Other (See Comments)    Cramping and gas pain   Family History  Problem Relation Age of Onset  . Coronary artery disease Father   . Heart failure Father   . Hypertension Father   . Sleep apnea Father   . Heart disease Father   . Arthritis Mother   . Obesity Mother   . Diabetes Unknown        grandmother  . Cancer Sister        GYN-type cancer  . GER disease Daughter   . GER disease Son   . Diabetes Maternal Grandmother   . Cancer Maternal Grandmother        ovarian  . Cancer Paternal Grandmother        lung cancer, + tobacco and colon  . Seizures Sister    Social History   Socioeconomic History  . Marital status: Married    Spouse name: Rosemarie Ax  . Number of children: 2  . Years of education: Master's Degree  . Highest education level: Not on file  Occupational History  . Occupation: VP Operations    Comment: AT [employer]  . Financial resource strain: Not  on file  . Food insecurity:    Worry: Not on file    Inability: Not on file  . Transportation needs:    Medical: Not on file    Non-medical: Not on file  Tobacco Use  . Smoking status: Never Smoker  . Smokeless tobacco: Never Used  Substance and Sexual Activity  . Alcohol use: Yes    Comment: social  . Drug use: No  . Sexual activity: Yes    Partners: Female    Birth control/protection: Surgical  Lifestyle  . Physical activity:    Days per week: Not on file    Minutes per session: Not on file  . Stress: Not on file  Relationships  . Social connections:    Talks on phone: Not on file    Gets together: Not on file    Attends religious service: Not on file    Active member of club or organization: Not on file    Attends meetings of clubs or organizations: Not on file    Relationship status: Not on file  Other Topics Concern  . Not on file  Social  History Narrative   Lives with his wife and children.   Depression screen [facility] 2/9 08/17/2017 08/13/2017 04/08/2017 03/04/2017 02/19/2017  Decreased Interest 0 0 0 2 0  Down, Depressed, Hopeless 0 0 0 1 0  PHQ - 2 Score 0 0 0 3 0  Altered sleeping - 0 0 3 -  Tired, decreased energy - 0 0 3 -  Change in appetite - 0 0 0 -  Feeling bad or failure about yourself  - 0 0 0 -  Trouble concentrating - 0 0 0 -  Moving slowly or fidgety/restless - 0 0 0 -  Suicidal thoughts - 0 0 0 -  PHQ-9 Score - 0 0 9 -     Review of Systems See hpi    Objective:   Physical Exam  Constitutional: He is oriented to person, place, and time. He appears well-developed and well-nourished. No distress.  HENT:  Head: Normocephalic and atraumatic.  Right Ear: Tympanic membrane, external ear and ear canal normal.  Left Ear: Tympanic membrane, external ear and ear canal normal.  Nose: Nose normal.  Mouth/Throat: Uvula is midline, oropharynx is clear and moist and mucous membranes are normal. No oropharyngeal exudate.  Eyes: Conjunctivae are normal. Right eye exhibits no discharge. Left eye exhibits no discharge. No scleral icterus.  Neck: Normal range of motion. Neck supple. No thyromegaly present.  Cardiovascular: Normal rate, regular rhythm and intact distal pulses.  Murmur (LUSB pansystole) heard.  Decrescendo systolic murmur is present with a grade of 2/6. Pulmonary/Chest: Effort normal and breath sounds normal. No respiratory distress.  Abdominal: Soft. Bowel sounds are normal. He exhibits no distension and no mass. There is no tenderness. There is no rebound and no guarding.  Genitourinary: Rectal exam shows anal tone abnormal (increased, ? mildly stenosed). Prostate is enlarged. Prostate is not tender.  Genitourinary Comments: Prostate lobes palpate equivalently with diffuse mild enlargement, no nodules, masses, or asymmetry noted.  Musculoskeletal: He exhibits no edema.  Lymphadenopathy:    He has no  cervical adenopathy.  Neurological: He is alert and oriented to person, place, and time. He has normal reflexes. He displays normal reflexes. No cranial nerve deficit. He exhibits normal muscle tone.  Skin: Skin is warm and dry. No rash noted. He is not diaphoretic. No erythema.  Psychiatric: His speech is normal and behavior is normal. Judgment  and thought content normal. His affect is blunt.       BP 127/79 (BP Location: Right Arm, Patient Position: Sitting, Cuff Size: Large)   Pulse 70   Temp 98.6 F (37 C) (Oral)   Resp 16   Ht 5\' 5"  (1.651 m)   Wt 246 lb 3.2 oz (111.7 kg)   SpO2 98%   BMI 40.97 kg/m     EKG: NSR, no acute ischemic changes noted. No significant change noted when compared to prior EKG done 03/04/17.   I have personally reviewed the EKG tracing and agree with the computer interpretation.  Sinus  Rhythm  WITHIN NORMAL LIMITS   Assessment & Plan:   1. Annual physical exam   2. Screening for cardiovascular, respiratory, and genitourinary diseases   3. Morbid obesity ([place])   4. Chronic left shoulder pain   5. Osteoarthritis of left shoulder, unspecified osteoarthritis type   6. Vitamin D deficiency   7. Prediabetes   8. Class 2 obesity due to excess calories without serious comorbidity with body mass index (BMI) of 39.0 to 39.9 in adult   9. Other hyperlipidemia - ASCVD risk borderline at 6.9% using today's BP and lipids drawn 2 mos prior 06/07/18 (lifetime risk 46%; optimal risk 4.9%).  His lipids 4 mos prior (2/7) were much better (LDL 25 mg/dL lower) but when checked 4 mos prior to THAT were sig worse with LDL 21 mg/dL HIGHER than it is today. The large variability in LDL levels suggests to me that there is a sig lifestyle component to his HLD so will hold off on starting statin for now since he is actively working on improving his diet. Also w/ h/o pre-DM so statin side effect of slight cbg increase could have the potential to tip him over into DM. Advised pt that  as he ages, his risk is going to increase and so it is likely that he will be strongly encouraged to start on a chol med at some point within the next future few yrs. Pt NOT fasting today so did not recheck  10. Benign prostatic hyperplasia with urinary frequency -   11. Heart murmur - pt does remember being diagnosed w/ a heart murmur as a young child but hasn't been mentioned since. No prior cardiology eval, no prior murmur noted in Epic or other exams. Checked EKG today to ensure no sig change in cardiac structure/hypertrophy that would cause murmur to come out.   12. Tinea pedis of both feet - try topical ketoconazole daily until completely gone - could take 6+ wks - then will need to decontaminate or replace foot wear so doesn't recur    Orders Placed This Encounter  Procedures  . PSA  . CBC with Differential/Platelet  . Ambulatory referral to Physical Medicine Rehab    Referral Priority:   Routine    Referral Type:   Rehabilitation    Referral Reason:   Specialty Services Required    Requested Specialty:   Physical Medicine and Rehabilitation    Number of Visits Requested:   1  . POCT urinalysis dipstick  . EKG 12-Lead    Meds ordered this encounter  Medications  . Zoster Vaccine Adjuvanted [facility]) injection    Sig: Inject 0.5 mLs into the muscle once for 1 dose. Repeat once in 2-6 months.    Dispense:  0.5 mL    Refill:  1  . tamsulosin (FLOMAX) 0.4 MG CAPS capsule    Sig: Take 1 capsule (0.4  mg total) by mouth daily after supper.    Dispense:  90 capsule    Refill:  3  . celecoxib (CELEBREX) 200 MG capsule    Sig: Take 1 capsule (200 mg total) by mouth daily.    Dispense:  90 capsule    Refill:  0  . ketoconazole (NIZORAL) 2 % cream    Sig: Apply 1 application topically daily. To entirety of both feet    Dispense:  60 g    Refill:  2     Delman Cheadle, M.D.  Primary Care at [facility] [address] [place], [place] [address] [phone] phone [phone] fax  08/05/18 3:21 PM

## 2018-08-06 LAB — CBC WITH DIFFERENTIAL/PLATELET
BASOS ABS: 0 10*3/uL (ref 0.0–0.2)
Basos: 0 %
EOS (ABSOLUTE): 0.2 10*3/uL (ref 0.0–0.4)
Eos: 2 %
Hematocrit: 45.5 % (ref 37.5–51.0)
Hemoglobin: 15.1 g/dL (ref 13.0–17.7)
IMMATURE GRANS (ABS): 0 10*3/uL (ref 0.0–0.1)
Immature Granulocytes: 0 %
LYMPHS: 32 %
Lymphocytes Absolute: 3.3 10*3/uL — ABNORMAL HIGH (ref 0.7–3.1)
MCH: 28.8 pg (ref 26.6–33.0)
MCHC: 33.2 g/dL (ref 31.5–35.7)
MCV: 87 fL (ref 79–97)
MONOCYTES: 4 %
Monocytes Absolute: 0.4 10*3/uL (ref 0.1–0.9)
NEUTROS ABS: 6.2 10*3/uL (ref 1.4–7.0)
Neutrophils: 62 %
Platelets: 275 10*3/uL (ref 150–450)
RBC: 5.25 x10E6/uL (ref 4.14–5.80)
RDW: 14.1 % (ref 12.3–15.4)
WBC: 10.2 10*3/uL (ref 3.4–10.8)

## 2018-08-06 LAB — PSA: PROSTATE SPECIFIC AG, SERUM: 1.3 ng/mL (ref 0.0–4.0)

## 2018-08-10 ENCOUNTER — Encounter: Payer: Self-pay | Admitting: Family Medicine

## 2018-08-20 ENCOUNTER — Other Ambulatory Visit (INDEPENDENT_AMBULATORY_CARE_PROVIDER_SITE_OTHER): Payer: Self-pay | Admitting: Family Medicine

## 2018-08-20 DIAGNOSIS — E559 Vitamin D deficiency, unspecified: Secondary | ICD-10-CM

## 2018-08-22 ENCOUNTER — Encounter (INDEPENDENT_AMBULATORY_CARE_PROVIDER_SITE_OTHER): Payer: Self-pay | Admitting: Family Medicine

## 2018-08-23 ENCOUNTER — Ambulatory Visit (INDEPENDENT_AMBULATORY_CARE_PROVIDER_SITE_OTHER): Payer: Managed Care, Other (non HMO) | Admitting: Family Medicine

## 2018-08-23 VITALS — BP 139/76 | HR 63 | Temp 98.6°F | Ht 65.0 in | Wt 236.0 lb

## 2018-08-23 DIAGNOSIS — E559 Vitamin D deficiency, unspecified: Secondary | ICD-10-CM | POA: Diagnosis not present

## 2018-08-23 DIAGNOSIS — Z9189 Other specified personal risk factors, not elsewhere classified: Secondary | ICD-10-CM

## 2018-08-23 DIAGNOSIS — R0602 Shortness of breath: Secondary | ICD-10-CM | POA: Diagnosis not present

## 2018-08-23 DIAGNOSIS — Z6839 Body mass index (BMI) 39.0-39.9, adult: Secondary | ICD-10-CM

## 2018-08-23 MED ORDER — VITAMIN D (ERGOCALCIFEROL) 1.25 MG (50000 UNIT) PO CAPS: 50000.0000 [IU] | ORAL_CAPSULE | ORAL | 0 refills | Status: DC

## 2018-08-23 NOTE — Progress Notes (Signed)
Office: [phone]  /  Fax: [phone]   HPI:   Chief Complaint: OBESITY Bryan Lee is here to discuss his progress with his obesity treatment plan. He is on the lower carbohydrate, vegetable and lean protein rich diet plan and is following his eating plan approximately 97 % of the time. He states he is exercising with weights and walking on the treadmill 30 to 45 minutes 3 times per week. Bryan Lee is frustrated that his weight loss has stalled. Bryan Lee requested his resting metabolic rate be rechecked and if has  improved to 2200 kcal/day to maintain his current weight.Marland Kitchen His weight is 236 lb (107 kg) today and has not lost weight since his last visit. He has lost 59 lbs since starting treatment with [name].  Vitamin D deficiency Bryan Lee has a diagnosis of vitamin D deficiency. His last vitamin D level was low at 25. He is not yet at goal on prescription vit D and he denies nausea, vomiting or muscle weakness.  Shortness of Breath on exertion Bryan Lee shortness of breath greatly improved with weight loss and increased exercise. Repeat indirect calorimetry today shows RMR increased from 1500 to 2200.  At risk for cardiovascular disease Bryan Lee is at a higher than average risk for cardiovascular disease due to obesity. He currently denies any chest pain.  ALLERGIES: Allergies  Allergen Reactions  . Lactose Intolerance (Gi) Other (See Comments)    Cramping and gas pain    MEDICATIONS: Current Outpatient Medications on File Prior to Visit  Medication Sig Dispense Refill  . celecoxib (CELEBREX) 200 MG capsule Take 1 capsule (200 mg total) by mouth daily. 90 capsule 0  . ketoconazole (NIZORAL) 2 % cream Apply 1 application topically daily. To entirety of both feet 60 g 2  . metFORMIN (GLUCOPHAGE) 500 MG tablet Take 1 tablet (500 mg total) by mouth 2 (two) times daily with a meal. 60 tablet 0  . omega-3 acid ethyl esters (LOVAZA) 1 g capsule Take 2 g by mouth 2 (two) times daily.    .  tamsulosin (FLOMAX) 0.4 MG CAPS capsule Take 1 capsule (0.4 mg total) by mouth daily after supper. 90 capsule 3  . TURMERIC PO Take 2 capsules by mouth daily.     No current facility-administered medications on file prior to visit.     PAST MEDICAL HISTORY: Past Medical History:  Diagnosis Date  . Achilles rupture   . Acid reflux   . Back pain   . Carpal tunnel syndrome   . Chest pain   . Constipation   . Dizzy spells   . Joint pain   . Lactose intolerance   . Lipoma   . Numbness    legs, hands  . Obesity   . Plantar fasciitis   . Shoulder pain    rotator cuff  . SOB (shortness of breath) on exertion     PAST SURGICAL HISTORY: Past Surgical History:  Procedure Laterality Date  . ACHILLES TENDON REPAIR     left  . HERNIA REPAIR    . lump removal    . VASECTOMY      SOCIAL HISTORY: Social History   Tobacco Use  . Smoking status: Never Smoker  . Smokeless tobacco: Never Used  Substance Use Topics  . Alcohol use: Yes    Comment: social  . Drug use: No    FAMILY HISTORY: Family History  Problem Relation Age of Onset  . Coronary artery disease Father   . Heart failure Father   .  Hypertension Father   . Sleep apnea Father   . Heart disease Father   . Arthritis Mother   . Obesity Mother   . Diabetes Unknown        grandmother  . Cancer Sister        GYN-type cancer  . GER disease Daughter   . GER disease Son   . Diabetes Maternal Grandmother   . Cancer Maternal Grandmother        ovarian  . Cancer Paternal Grandmother        lung cancer, + tobacco and colon  . Seizures Sister     ROS: Review of Systems  Constitutional: Negative for weight loss.  Respiratory: Positive for shortness of breath (on exertion).   Cardiovascular: Negative for chest pain.  Gastrointestinal: Negative for nausea and vomiting.  Musculoskeletal:       Negative for muscle weakness    PHYSICAL EXAM: Blood pressure 139/76, pulse 63, temperature 98.6 F (37 C),  temperature source Oral, height 5\' 5"  (1.651 m), weight 236 lb (107 kg), SpO2 96 %. Body mass index is 39.27 kg/m. Physical Exam  Constitutional: He is oriented to person, place, and time. He appears well-developed and well-nourished.  Cardiovascular: Normal rate.  Pulmonary/Chest: Effort normal.  Musculoskeletal: Normal range of motion.  Neurological: He is oriented to person, place, and time.  Skin: Skin is warm and dry.  Psychiatric: He has a normal mood and affect. His behavior is normal.  Vitals reviewed.   RECENT LABS AND TESTS: BMET    Component Value Date/Time   NA 139 06/07/2018 0758   K 3.6 06/07/2018 0758   CL 98 06/07/2018 0758   CO2 27 06/07/2018 0758   GLUCOSE 79 06/07/2018 0758   GLUCOSE 123 (H) 11/07/2015 0901   BUN 15 06/07/2018 0758   CREATININE 0.92 06/07/2018 0758   CREATININE 0.89 09/08/2013 1631   CALCIUM 9.5 06/07/2018 0758   GFRNONAA 93 06/07/2018 0758   GFRAA 108 06/07/2018 0758   Lab Results  Component Value Date   HGBA1C 5.2 06/07/2018   HGBA1C 5.3 02/03/2018   HGBA1C 5.3 10/18/2017   HGBA1C 5.3 06/17/2017   HGBA1C 5.9 (H) 03/04/2017   Lab Results  Component Value Date   INSULIN 7.2 06/07/2018   INSULIN 5.6 02/03/2018   INSULIN 10.1 10/18/2017   INSULIN 9.7 06/17/2017   INSULIN 16.2 03/04/2017   CBC    Component Value Date/Time   WBC 10.2 08/05/2018 1605   WBC 8.8 11/07/2015 0901   RBC 5.25 08/05/2018 1605   RBC 5.54 11/07/2015 0901   HGB 15.1 08/05/2018 1605   HCT 45.5 08/05/2018 1605   PLT 275 08/05/2018 1605   MCV 87 08/05/2018 1605   MCH 28.8 08/05/2018 1605   MCH 27.6 09/08/2013 1631   MCHC 33.2 08/05/2018 1605   MCHC 33.1 11/07/2015 0901   RDW 14.1 08/05/2018 1605   LYMPHSABS 3.3 (H) 08/05/2018 1605   MONOABS 0.5 11/07/2015 0901   EOSABS 0.2 08/05/2018 1605   BASOSABS 0.0 08/05/2018 1605   Iron/TIBC/Ferritin/ %Sat No results found for: IRON, TIBC, FERRITIN, IRONPCTSAT Lipid Panel     Component Value Date/Time    CHOL 216 (H) 06/07/2018 0758   TRIG 78 06/07/2018 0758   HDL 43 06/07/2018 0758   CHOLHDL 6 11/07/2015 0901   VLDL 36.6 11/07/2015 0901   LDLCALC 157 (H) 06/07/2018 0758   Hepatic Function Panel     Component Value Date/Time   PROT 7.1 06/07/2018 0758   ALBUMIN  3.9 06/07/2018 0758   AST 27 06/07/2018 0758   ALT 28 06/07/2018 0758   ALKPHOS 47 06/07/2018 0758   BILITOT 0.6 06/07/2018 0758   BILIDIR 0.1 11/07/2015 0901   IBILI 0.3 09/08/2013 1631      Component Value Date/Time   TSH 0.826 06/07/2018 0758   TSH 0.735 03/04/2017 1145   TSH 0.96 11/07/2015 0901   Results for DEONTRE, ALLSUP (MRN [id number]) as of 08/23/2018 15:42  Ref. Range 06/07/2018 07:58  Vitamin D, 25-Hydroxy Latest Ref Range: 30.0 - 100.0 ng/mL 25.8 (L)   ASSESSMENT AND PLAN: Vitamin D deficiency - Plan: Vitamin D, Ergocalciferol, (DRISDOL) 50000 units CAPS capsule  Shortness of breath on exertion  At risk for heart disease  Class 2 severe obesity with serious comorbidity and body mass index (BMI) of 39.0 to 39.9 in adult, unspecified obesity type (Bryan Lee)  PLAN:  Vitamin D Deficiency Bryan Lee was informed that low vitamin D levels contributes to fatigue and are associated with obesity, breast, and colon cancer. He agrees to continue to take prescription Vit D @50 ,000 IU every week #4 with no refills and will follow up for routine testing of vitamin D, at least 2-3 times per year. He was informed of the risk of over-replacement of vitamin D and agrees to not increase his dose unless he discusses this with [name] first. Hillel agrees to follow up as directed.  Shortness of Breath on exertion Bryan Lee will continue with cardio and strengthening exercise with a goal of 150 minutes per week. Bryan Lee will follow up with our clinic in 4 weeks.  Cardiovascular risk counseling Bryan Lee was given extended (15 minutes) coronary artery disease prevention counseling today. He is 57 y.o. male and has risk factors for heart  disease including obesity. We discussed intensive lifestyle modifications today with an emphasis on specific weight loss instructions and strategies. Pt was also informed of the importance of increasing exercise and decreasing saturated fats to help prevent heart disease.  Obesity Bryan Lee is currently in the action stage of change. As such, his goal is to continue with weight loss efforts He has agreed to change to keeping a food journal with 1500 to 1700 calories and 120 grams of protein daily. I will change his journaling goals per his new RMR result. Bryan Lee has been instructed to work up to a goal of 150 minutes of combined cardio and strengthening exercise per week for weight loss and overall health benefits. We discussed the following Behavioral Modification Strategies today: keep a strict food journal, increasing lean protein intake and decreasing simple carbohydrates   Bryan Lee has agreed to follow up with our clinic in 4 weeks. He was informed of the importance of frequent follow up visits to maximize his success with intensive lifestyle modifications for his multiple health conditions.   OBESITY BEHAVIORAL INTERVENTION VISIT  Today's visit was # 23  Starting weight: 294 lbs Starting date: 03/04/17 Today's weight : 236 lbs  Today's date: 08/23/2018 Total lbs lost to date: 63 At least 15 minutes were spent on discussing the following behavioral intervention visit.   ASK: We discussed the diagnosis of obesity with Bryan Lee today and Bryan Lee agreed to give [name] permission to discuss obesity behavioral modification therapy today.  ASSESS: Bryan Lee has the diagnosis of obesity and his BMI today is 39.27 Bryan Lee is in the action stage of change   ADVISE: Bryan Lee was educated on the multiple health risks of obesity as well as the benefit of weight loss to improve  his health. He was advised of the need for long term treatment and the importance of lifestyle modifications to improve  his current health and to decrease his risk of future health problems.  AGREE: Multiple dietary modification options and treatment options were discussed and  Bryan Lee agreed to follow the recommendations documented in the above note.  ARRANGE: Bryan Lee was educated on the importance of frequent visits to treat obesity as outlined per CMS and USPSTF guidelines and agreed to schedule his next follow up appointment today.  I, Doreene Nest, am acting as transcriptionist for Dennard Nip, MD  I have reviewed the above documentation for accuracy and completeness, and I agree with the above. -Dennard Nip, MD

## 2018-09-17 ENCOUNTER — Other Ambulatory Visit (INDEPENDENT_AMBULATORY_CARE_PROVIDER_SITE_OTHER): Payer: Self-pay | Admitting: Family Medicine

## 2018-09-17 DIAGNOSIS — E559 Vitamin D deficiency, unspecified: Secondary | ICD-10-CM

## 2018-09-20 ENCOUNTER — Ambulatory Visit (INDEPENDENT_AMBULATORY_CARE_PROVIDER_SITE_OTHER): Payer: Managed Care, Other (non HMO) | Admitting: Family Medicine

## 2018-09-20 VITALS — BP 103/66 | HR 71 | Temp 98.0°F | Ht 65.0 in | Wt 237.0 lb

## 2018-09-20 DIAGNOSIS — Z9189 Other specified personal risk factors, not elsewhere classified: Secondary | ICD-10-CM | POA: Diagnosis not present

## 2018-09-20 DIAGNOSIS — E8881 Metabolic syndrome: Secondary | ICD-10-CM

## 2018-09-20 DIAGNOSIS — Z6839 Body mass index (BMI) 39.0-39.9, adult: Secondary | ICD-10-CM

## 2018-09-20 MED ORDER — LIRAGLUTIDE 18 MG/3ML ~~LOC~~ SOPN: 1.2000 mg | PEN_INJECTOR | Freq: Every morning | SUBCUTANEOUS | 0 refills | Status: DC

## 2018-09-20 NOTE — Progress Notes (Addendum)
Office: [phone]  /  Fax: [phone]   HPI:   Chief Complaint: OBESITY Bryan Lee is here to discuss his progress with his obesity treatment plan. He is on the  keep a food journal with 1400 calories and 120 protein  and is following his eating plan approximately 90 % of the time. He states he is exercising 30 minutes 3 times per week. Bryan Lee is currently struggling to lose weight, he has lost 60 lbs with Bryan Lee but he is not satisfied. He notes having an "all or nothing" personality and if he is unale to to lose weight he is unintrested in maintaining his weight loss. He request to add HTT exercise to his routine.  His weight is 237 lb (107.5 kg) today and has gained 1 lb since his last visit. He has lost 59 lbs since starting treatment with Bryan Lee.  Insulin Resistance Bryan Lee has a diagnosis of insulin resistance based on his elevated fasting insulin level >5. Although Bryan Lee's blood glucose readings are still under good control, insulin resistance puts him at greater risk of metabolic syndrome and diabetes. He is taking metformin currently and continues to work on diet and exercise to decrease risk of diabetes.  At risk for diabetes Bryan Lee is at higher than averagerisk for developing diabetes due to his obesity. He currently denies polyuria or polydipsia. Hemoglobin A1C Latest Ref Rng & Units 06/07/2018 02/03/2018 10/18/2017  HGBA1C 4.8 - 5.6 % 5.2 5.3 5.3  Some recent data might be hidden    He has been working on intensive lifestyle modifications including diet, exercise, and weight loss to help control his blood glucose levels.   ALLERGIES: Allergies  Allergen Reactions  . Lactose Intolerance (Gi) Other (See Comments)    Cramping and gas pain    MEDICATIONS: Current Outpatient Medications on File Prior to Visit  Medication Sig Dispense Refill  . celecoxib (CELEBREX) 200 MG capsule Take 1 capsule (200 mg total) by mouth daily. 90 capsule 0  . ketoconazole (NIZORAL) 2 % cream  Apply 1 application topically daily. To entirety of both feet 60 g 2  . metFORMIN (GLUCOPHAGE) 500 MG tablet Take 1 tablet (500 mg total) by mouth 2 (two) times daily with a meal. 60 tablet 0  . omega-3 acid ethyl esters (LOVAZA) 1 g capsule Take 2 g by mouth 2 (two) times daily.    . tamsulosin (FLOMAX) 0.4 MG CAPS capsule Take 1 capsule (0.4 mg total) by mouth daily after supper. 90 capsule 3  . TURMERIC PO Take 2 capsules by mouth daily.    . Vitamin D, Ergocalciferol, (DRISDOL) 50000 units CAPS capsule Take 1 capsule (50,000 Units total) by mouth every 7 (seven) days. 4 capsule 0   No current facility-administered medications on file prior to visit.     PAST MEDICAL HISTORY: Past Medical History:  Diagnosis Date  . Achilles rupture   . Acid reflux   . Back pain   . Carpal tunnel syndrome   . Chest pain   . Constipation   . Dizzy spells   . Joint pain   . Lactose intolerance   . Lipoma   . Numbness    legs, hands  . Obesity   . Plantar fasciitis   . Shoulder pain    rotator cuff  . SOB (shortness of breath) on exertion     PAST SURGICAL HISTORY: Past Surgical History:  Procedure Laterality Date  . ACHILLES TENDON REPAIR     left  . HERNIA REPAIR    .  lump removal    . VASECTOMY      SOCIAL HISTORY: Social History   Tobacco Use  . Smoking status: Never Smoker  . Smokeless tobacco: Never Used  Substance Use Topics  . Alcohol use: Yes    Comment: social  . Drug use: No    FAMILY HISTORY: Family History  Problem Relation Age of Onset  . Coronary artery disease Father   . Heart failure Father   . Hypertension Father   . Sleep apnea Father   . Heart disease Father   . Arthritis Mother   . Obesity Mother   . Diabetes Unknown        grandmother  . Cancer Sister        GYN-type cancer  . GER disease Daughter   . GER disease Son   . Diabetes Maternal Grandmother   . Cancer Maternal Grandmother        ovarian  . Cancer Paternal Grandmother        lung  cancer, + tobacco and colon  . Seizures Sister     ROS: Review of Systems  Constitutional: Negative for weight loss.  Gastrointestinal:       Polyphagia  All other systems reviewed and are negative.   PHYSICAL EXAM: Blood pressure 103/66, pulse 71, temperature 98 F (36.7 C), temperature source Oral, height 5\' 5"  (1.651 m), weight 237 lb (107.5 kg), SpO2 97 %. Body mass index is 39.44 kg/m. Physical Exam  Constitutional: He is oriented to person, place, and time. He appears well-developed and well-nourished.  HENT:  Head: Normocephalic.  Eyes: EOM are normal.  Neck: Normal range of motion.  Pulmonary/Chest: Effort normal.  Musculoskeletal: Normal range of motion.  Neurological: He is alert and oriented to person, place, and time.  Skin: Skin is warm and dry.  Psychiatric: He has a normal mood and affect. His behavior is normal.    RECENT LABS AND TESTS: BMET    Component Value Date/Time   NA 139 06/07/2018 0758   K 3.6 06/07/2018 0758   CL 98 06/07/2018 0758   CO2 27 06/07/2018 0758   GLUCOSE 79 06/07/2018 0758   GLUCOSE 123 (H) 11/07/2015 0901   BUN 15 06/07/2018 0758   CREATININE 0.92 06/07/2018 0758   CREATININE 0.89 09/08/2013 1631   CALCIUM 9.5 06/07/2018 0758   GFRNONAA 93 06/07/2018 0758   GFRAA 108 06/07/2018 0758   Lab Results  Component Value Date   HGBA1C 5.2 06/07/2018   HGBA1C 5.3 02/03/2018   HGBA1C 5.3 10/18/2017   HGBA1C 5.3 06/17/2017   HGBA1C 5.9 (H) 03/04/2017   Lab Results  Component Value Date   INSULIN 7.2 06/07/2018   INSULIN 5.6 02/03/2018   INSULIN 10.1 10/18/2017   INSULIN 9.7 06/17/2017   INSULIN 16.2 03/04/2017   CBC    Component Value Date/Time   WBC 10.2 08/05/2018 1605   WBC 8.8 11/07/2015 0901   RBC 5.25 08/05/2018 1605   RBC 5.54 11/07/2015 0901   HGB 15.1 08/05/2018 1605   HCT 45.5 08/05/2018 1605   PLT 275 08/05/2018 1605   MCV 87 08/05/2018 1605   MCH 28.8 08/05/2018 1605   MCH 27.6 09/08/2013 1631   MCHC  33.2 08/05/2018 1605   MCHC 33.1 11/07/2015 0901   RDW 14.1 08/05/2018 1605   LYMPHSABS 3.3 (H) 08/05/2018 1605   MONOABS 0.5 11/07/2015 0901   EOSABS 0.2 08/05/2018 1605   BASOSABS 0.0 08/05/2018 1605   Iron/TIBC/Ferritin/ %Sat No results found for:  IRON, TIBC, FERRITIN, IRONPCTSAT Lipid Panel     Component Value Date/Time   CHOL 216 (H) 06/07/2018 0758   TRIG 78 06/07/2018 0758   HDL 43 06/07/2018 0758   CHOLHDL 6 11/07/2015 0901   VLDL 36.6 11/07/2015 0901   LDLCALC 157 (H) 06/07/2018 0758   Hepatic Function Panel     Component Value Date/Time   PROT 7.1 06/07/2018 0758   ALBUMIN 3.9 06/07/2018 0758   AST 27 06/07/2018 0758   ALT 28 06/07/2018 0758   ALKPHOS 47 06/07/2018 0758   BILITOT 0.6 06/07/2018 0758   BILIDIR 0.1 11/07/2015 0901   IBILI 0.3 09/08/2013 1631      Component Value Date/Time   TSH 0.826 06/07/2018 0758   TSH 0.735 03/04/2017 1145   TSH 0.96 11/07/2015 0901    ASSESSMENT AND PLAN: Insulin resistance - Plan: liraglutide (VICTOZA) 18 MG/3ML SOPN  At risk for diabetes mellitus  Class 2 severe obesity with serious comorbidity and body mass index (BMI) of 39.0 to 39.9 in adult, unspecified obesity type ([place])  PLAN: Insulin Resistance Bryan Lee will continue to work on weight loss, exercise, and decreasing simple carbohydrates in his diet to help decrease the risk of diabetes. We dicussed metformin including benefits and risks. He was informed that eating too many simple carbohydrates or too many calories at one sitting increases the likelihood of GI side effects. Abb declined metformin for now and prescription was not written today. Bryan Lee agreed to follow up with Bryan Lee as directed to monitor his progress. We did write a prescription today for Victoza 1.2 mg Qam for 2 pens (pt to start at 0.6 mg) and continue Metformin as is.   Diabetes risk counselling Bryan Lee was given extended (15 minutes) diabetes prevention counseling today. He is 57 y.o. male  and has risk factors for diabetes including obesity. We discussed intensive lifestyle modifications today with an emphasis on weight loss as well as increasing exercise and decreasing simple carbohydrates in his diet.  Obesity Michaeal is currently in the action stage of change. As such, his goal is to continue with weight loss efforts He has agreed to keep a food journal with 1400-1800 calories and 120 protein  Saxton has been instructed to work up to a goal of 150 minutes of combined cardio and strengthening exercise per week for weight loss and overall health benefits. He will increase cardio to 30 mins 2 times a week in addition to his strengthening exercises.  We discussed the following Behavioral Modification Stratagies today: increasing lean protein intake, decreasing simple carbohydrates  and work on meal planning and easy cooking plans   Arion has agreed to follow up with our clinic in 2 weeks. He was informed of the importance of frequent follow up visits to maximize his success with intensive lifestyle modifications for his multiple health conditions.   OBESITY BEHAVIORAL INTERVENTION VISIT  Today's visit was # 24  Starting weight: 294 lb Starting date: 03/04/17 Today's weight : Weight: 237 lb (107.5 kg)  Today's date: 09/20/2018 Total lbs lost to date: 51    ASK: We discussed the diagnosis of obesity with [name] today and Juanda Crumble agreed to give Bryan Lee permission to discuss obesity behavioral modification therapy today.  ASSESS: Yuepheng has the diagnosis of obesity and his BMI today is @TBMI @ [place] is in the action stage of change   ADVISE: Jayd was educated on the multiple health risks of obesity as well as the benefit of weight loss to improve his health.  He was advised of the need for long term treatment and the importance of lifestyle modifications to improve his current health and to decrease his risk of future health problems.  AGREE: Multiple dietary  modification options and treatment options were discussed and  Zyaire agreed to follow the recommendations documented in the above note.  ARRANGE: [place] was educated on the importance of frequent visits to treat obesity as outlined per CMS and USPSTF guidelines and agreed to schedule his next follow up appointment today.  I, April Moore, am acting as transcriptionist for Dr Dennard Nip  I have reviewed the above documentation for accuracy and completeness, and I agree with the above. -Dennard Nip, MD

## 2018-09-21 ENCOUNTER — Other Ambulatory Visit (INDEPENDENT_AMBULATORY_CARE_PROVIDER_SITE_OTHER): Payer: Self-pay

## 2018-09-21 ENCOUNTER — Encounter (INDEPENDENT_AMBULATORY_CARE_PROVIDER_SITE_OTHER): Payer: Self-pay | Admitting: Family Medicine

## 2018-09-21 DIAGNOSIS — E559 Vitamin D deficiency, unspecified: Secondary | ICD-10-CM

## 2018-09-21 MED ORDER — VITAMIN D (ERGOCALCIFEROL) 1.25 MG (50000 UNIT) PO CAPS: 50000.0000 [IU] | ORAL_CAPSULE | ORAL | 0 refills | Status: DC

## 2018-09-27 ENCOUNTER — Telehealth (INDEPENDENT_AMBULATORY_CARE_PROVIDER_SITE_OTHER): Payer: Self-pay | Admitting: Family Medicine

## 2018-09-27 NOTE — Telephone Encounter (Signed)
Jinny Blossom with [facility]  [phone] called and is requesting a prescription for pin needles for this patient.

## 2018-09-28 ENCOUNTER — Other Ambulatory Visit (INDEPENDENT_AMBULATORY_CARE_PROVIDER_SITE_OTHER): Payer: Self-pay

## 2018-09-28 DIAGNOSIS — E8881 Metabolic syndrome: Secondary | ICD-10-CM

## 2018-09-28 DIAGNOSIS — E88819 Insulin resistance, unspecified: Secondary | ICD-10-CM

## 2018-09-28 MED ORDER — INSULIN PEN NEEDLE 32G X 4 MM MISC: 1.0000 | Freq: Two times a day (BID) | 0 refills | Status: DC

## 2018-09-28 NOTE — Telephone Encounter (Signed)
Needles were sent to the pharmacy. Zianne Schubring, [place]

## 2018-10-11 ENCOUNTER — Ambulatory Visit (INDEPENDENT_AMBULATORY_CARE_PROVIDER_SITE_OTHER): Payer: Managed Care, Other (non HMO) | Admitting: Family Medicine

## 2018-10-11 VITALS — BP 110/68 | HR 62 | Temp 98.4°F | Ht 65.0 in | Wt 239.0 lb

## 2018-10-11 DIAGNOSIS — E7849 Other hyperlipidemia: Secondary | ICD-10-CM

## 2018-10-11 DIAGNOSIS — R7303 Prediabetes: Secondary | ICD-10-CM | POA: Diagnosis not present

## 2018-10-11 DIAGNOSIS — E559 Vitamin D deficiency, unspecified: Secondary | ICD-10-CM

## 2018-10-11 DIAGNOSIS — Z9189 Other specified personal risk factors, not elsewhere classified: Secondary | ICD-10-CM | POA: Diagnosis not present

## 2018-10-11 DIAGNOSIS — Z6839 Body mass index (BMI) 39.0-39.9, adult: Secondary | ICD-10-CM

## 2018-10-11 MED ORDER — VITAMIN D (ERGOCALCIFEROL) 1.25 MG (50000 UNIT) PO CAPS: 50000.0000 [IU] | ORAL_CAPSULE | ORAL | 0 refills | Status: DC

## 2018-10-12 LAB — COMPREHENSIVE METABOLIC PANEL
A/G RATIO: 1.2 (ref 1.2–2.2)
ALT: 40 IU/L (ref 0–44)
AST: 30 IU/L (ref 0–40)
Albumin: 4 g/dL (ref 3.5–5.5)
Alkaline Phosphatase: 56 IU/L (ref 39–117)
BUN / CREAT RATIO: 16 (ref 9–20)
BUN: 16 mg/dL (ref 6–24)
Bilirubin Total: 0.4 mg/dL (ref 0.0–1.2)
CALCIUM: 9.4 mg/dL (ref 8.7–10.2)
CO2: 29 mmol/L (ref 20–29)
Chloride: 97 mmol/L (ref 96–106)
Creatinine, Ser: 1.03 mg/dL (ref 0.76–1.27)
GFR, EST AFRICAN AMERICAN: 94 mL/min/{1.73_m2} (ref >59)
GFR, EST NON AFRICAN AMERICAN: 81 mL/min/{1.73_m2} (ref >59)
Globulin, Total: 3.3 g/dL (ref 1.5–4.5)
Glucose: 82 mg/dL (ref 65–99)
POTASSIUM: 3.7 mmol/L (ref 3.5–5.2)
Sodium: 142 mmol/L (ref 134–144)
TOTAL PROTEIN: 7.3 g/dL (ref 6.0–8.5)

## 2018-10-12 LAB — INSULIN, RANDOM: INSULIN: 9.9 u[IU]/mL (ref 2.6–24.9)

## 2018-10-12 LAB — HEMOGLOBIN A1C
Est. average glucose Bld gHb Est-mCnc: 103 mg/dL
Hgb A1c MFr Bld: 5.2 % (ref 4.8–5.6)

## 2018-10-12 LAB — LIPID PANEL WITH LDL/HDL RATIO
CHOLESTEROL TOTAL: 223 mg/dL — AB (ref 100–199)
HDL: 42 mg/dL (ref >39)
LDL CALC: 167 mg/dL — AB (ref 0–99)
LDl/HDL Ratio: 4 ratio — ABNORMAL HIGH (ref 0.0–3.6)
Triglycerides: 69 mg/dL (ref 0–149)
VLDL CHOLESTEROL CAL: 14 mg/dL (ref 5–40)

## 2018-10-12 LAB — VITAMIN D 25 HYDROXY (VIT D DEFICIENCY, FRACTURES): VIT D 25 HYDROXY: 44.6 ng/mL (ref 30.0–100.0)

## 2018-10-12 NOTE — Progress Notes (Signed)
Office: [phone]  /  Fax: [phone]   HPI:   Chief Complaint: OBESITY Bryan Lee is here to discuss his progress with his obesity treatment plan. He is keeping a food journal with 1400 to 1800 calories and 120 grams of protein and is following his eating plan approximately 98 % of the time. He states he is walking on the treadmill and using weights 20 to 30 minutes 3 times per week. Bryan Lee is journaling most days and is working on [employer] protein. He states his daily calories are not above 1800, but his RMR is 2,000 tested last again in 07/2018. He is getting the majority of his protein from protein shakes.  His weight is 239 lb (108.4 kg) today and has had a weight gain of 2 pounds since his last visit. He has lost 55 lbs since starting treatment with [name].  Vitamin D deficiency [name] has a diagnosis of vitamin D deficiency. He is currently taking vit D. His vitamin D level is not at goal, but is stable. He admits fatigue.  Hyperlipidemia Bryan Lee has hyperlipidemia and has been trying to improve his cholesterol levels with intensive lifestyle modification including a low saturated fat diet, exercise and weight loss. He is not on a statin and is attempting diet control. He denies any chest pain.  Pre-Diabetes Bryan Lee has a diagnosis of pre-diabetes based on his elevated Hgb A1c and was informed this puts him at greater risk of developing diabetes. He is taking metformin and Victoza and working on diet and weight loss to decrease risk of diabetes. He is having decreased polyphagia, but it is likely his simple carbs are too high.  At risk for diabetes Bryan Lee is at higher than average risk for developing diabetes due to his pre-diabetes and obesity.  ALLERGIES: Allergies  Allergen Reactions  . Lactose Intolerance (Gi) Other (See Comments)    Cramping and gas pain    MEDICATIONS: Current Outpatient Medications on File Prior to Visit  Medication Sig Dispense Refill  .  celecoxib (CELEBREX) 200 MG capsule Take 1 capsule (200 mg total) by mouth daily. 90 capsule 0  . Insulin Pen Needle (BD PEN NEEDLE NANO 2ND GEN) 32G X 4 MM MISC 1 Package by Does not apply route 2 (two) times daily. 100 each 0  . ketoconazole (NIZORAL) 2 % cream Apply 1 application topically daily. To entirety of both feet 60 g 2  . liraglutide (VICTOZA) 18 MG/3ML SOPN Inject 0.2 mLs (1.2 mg total) into the skin every morning. 2 pen 0  . metFORMIN (GLUCOPHAGE) 500 MG tablet Take 1 tablet (500 mg total) by mouth 2 (two) times daily with a meal. 60 tablet 0  . omega-3 acid ethyl esters (LOVAZA) 1 g capsule Take 2 g by mouth 2 (two) times daily.    . tamsulosin (FLOMAX) 0.4 MG CAPS capsule Take 1 capsule (0.4 mg total) by mouth daily after supper. 90 capsule 3  . TURMERIC PO Take 2 capsules by mouth daily.     No current facility-administered medications on file prior to visit.     PAST MEDICAL HISTORY: Past Medical History:  Diagnosis Date  . Achilles rupture   . Acid reflux   . Back pain   . Carpal tunnel syndrome   . Chest pain   . Constipation   . Dizzy spells   . Joint pain   . Lactose intolerance   . Lipoma   . Numbness    legs, hands  . Obesity   .  Plantar fasciitis   . Shoulder pain    rotator cuff  . SOB (shortness of breath) on exertion     PAST SURGICAL HISTORY: Past Surgical History:  Procedure Laterality Date  . ACHILLES TENDON REPAIR     left  . HERNIA REPAIR    . lump removal    . VASECTOMY      SOCIAL HISTORY: Social History   Tobacco Use  . Smoking status: Never Smoker  . Smokeless tobacco: Never Used  Substance Use Topics  . Alcohol use: Yes    Comment: social  . Drug use: No    FAMILY HISTORY: Family History  Problem Relation Age of Onset  . Coronary artery disease Father   . Heart failure Father   . Hypertension Father   . Sleep apnea Father   . Heart disease Father   . Arthritis Mother   . Obesity Mother   . Diabetes Unknown         grandmother  . Cancer Sister        GYN-type cancer  . GER disease Daughter   . GER disease Son   . Diabetes Maternal Grandmother   . Cancer Maternal Grandmother        ovarian  . Cancer Paternal Grandmother        lung cancer, + tobacco and colon  . Seizures Sister     ROS: Review of Systems  Constitutional: Positive for malaise/fatigue. Negative for weight loss.  Cardiovascular: Negative for chest pain.  Endo/Heme/Allergies:       Positive for polyphagia.    PHYSICAL EXAM: Blood pressure 110/68, pulse 62, temperature 98.4 F (36.9 C), temperature source Oral, height 5\' 5"  (1.651 m), weight 239 lb (108.4 kg), SpO2 98 %. Body mass index is 39.77 kg/m. Physical Exam  Constitutional: He is oriented to person, place, and time. He appears well-developed and well-nourished.  Cardiovascular: Normal rate.  Pulmonary/Chest: Effort normal.  Musculoskeletal: Normal range of motion.  Neurological: He is oriented to person, place, and time.  Skin: Skin is warm and dry.  Psychiatric: He has a normal mood and affect. His behavior is normal.  Vitals reviewed.   RECENT LABS AND TESTS: BMET    Component Value Date/Time   NA 142 10/11/2018 0829   K 3.7 10/11/2018 0829   CL 97 10/11/2018 0829   CO2 29 10/11/2018 0829   GLUCOSE 82 10/11/2018 0829   GLUCOSE 123 (H) 11/07/2015 0901   BUN 16 10/11/2018 0829   CREATININE 1.03 10/11/2018 0829   CREATININE 0.89 09/08/2013 1631   CALCIUM 9.4 10/11/2018 0829   GFRNONAA 81 10/11/2018 0829   GFRAA 94 10/11/2018 0829   Lab Results  Component Value Date   HGBA1C 5.2 10/11/2018   HGBA1C 5.2 06/07/2018   HGBA1C 5.3 02/03/2018   HGBA1C 5.3 10/18/2017   HGBA1C 5.3 06/17/2017   Lab Results  Component Value Date   INSULIN 9.9 10/11/2018   INSULIN 7.2 06/07/2018   INSULIN 5.6 02/03/2018   INSULIN 10.1 10/18/2017   INSULIN 9.7 06/17/2017   CBC    Component Value Date/Time   WBC 10.2 08/05/2018 1605   WBC 8.8 11/07/2015 0901   RBC  5.25 08/05/2018 1605   RBC 5.54 11/07/2015 0901   HGB 15.1 08/05/2018 1605   HCT 45.5 08/05/2018 1605   PLT 275 08/05/2018 1605   MCV 87 08/05/2018 1605   MCH 28.8 08/05/2018 1605   MCH 27.6 09/08/2013 1631   MCHC 33.2 08/05/2018 1605  MCHC 33.1 11/07/2015 0901   RDW 14.1 08/05/2018 1605   LYMPHSABS 3.3 (H) 08/05/2018 1605   MONOABS 0.5 11/07/2015 0901   EOSABS 0.2 08/05/2018 1605   BASOSABS 0.0 08/05/2018 1605   Iron/TIBC/Ferritin/ %Sat No results found for: IRON, TIBC, FERRITIN, IRONPCTSAT Lipid Panel     Component Value Date/Time   CHOL 223 (H) 10/11/2018 0829   TRIG 69 10/11/2018 0829   HDL 42 10/11/2018 0829   CHOLHDL 6 11/07/2015 0901   VLDL 36.6 11/07/2015 0901   LDLCALC 167 (H) 10/11/2018 0829   Hepatic Function Panel     Component Value Date/Time   PROT 7.3 10/11/2018 0829   ALBUMIN 4.0 10/11/2018 0829   AST 30 10/11/2018 0829   ALT 40 10/11/2018 0829   ALKPHOS 56 10/11/2018 0829   BILITOT 0.4 10/11/2018 0829   BILIDIR 0.1 11/07/2015 0901   IBILI 0.3 09/08/2013 1631      Component Value Date/Time   TSH 0.826 06/07/2018 0758   TSH 0.735 03/04/2017 1145   TSH 0.96 11/07/2015 0901   Results for RONDO, SPITTLER (MRN [id number]) as of 10/12/2018 09:16  Ref. Range 10/11/2018 08:29  Vitamin D, 25-Hydroxy Latest Ref Range: 30.0 - 100.0 ng/mL 44.6   ASSESSMENT AND PLAN: Vitamin D deficiency - Plan: VITAMIN D 25 Hydroxy (Vit-D Deficiency, Fractures), Vitamin D, Ergocalciferol, (DRISDOL) 50000 units CAPS capsule  Other hyperlipidemia - Plan: Lipid Panel With LDL/HDL Ratio  Prediabetes - Plan: Comprehensive metabolic panel, Hemoglobin A1c, Insulin, random  At risk for diabetes mellitus  Class 2 severe obesity with serious comorbidity and body mass index (BMI) of 39.0 to 39.9 in adult, unspecified obesity type (HCC)  PLAN:  Vitamin D Deficiency Osric was informed that low vitamin D levels contributes to fatigue and are associated with obesity, breast,  and colon cancer. He agrees to continue to take prescription Vit D @50 ,000 IU every week #4 with no refills and will follow up for routine testing of vitamin D, at least 2-3 times per year. He was informed of the risk of over-replacement of vitamin D and agrees to not increase his dose unless he discusses this with [name] first. We will check labs and he agrees to follow up in 4 weeks.  Hyperlipidemia Landers was informed of the American Heart Association Guidelines emphasizing intensive lifestyle modifications as the first line treatment for hyperlipidemia. We discussed many lifestyle modifications today in depth, and Sirus will continue to work on decreasing saturated fats such as fatty red meat, butter and many fried foods. He will also increase vegetables and lean protein in his diet and continue to work on exercise and weight loss efforts. We will check labs today. Lavell agrees to continue his diet and follow as directed in 4 weeks.  Pre-Diabetes Jovoni will continue to work on weight loss, exercise, and decreasing simple carbohydrates in his diet to help decrease the risk of diabetes. He was informed that eating too many simple carbohydrates or too many calories at one sitting increases the likelihood of GI side effects. We will check labs today. Chananya agreed to continue medications and follow up with [name] as directed to monitor his progress.  Diabetes risk counseling Deshan was given extended (15 minutes) diabetes prevention counseling today. He is 57 y.o. male and has risk factors for diabetes including pre-diabetes and obesity. We discussed intensive lifestyle modifications today with an emphasis on weight loss as well as increasing exercise and decreasing simple carbohydrates in his diet.  Obesity Emin is currently in  the action stage of change. As such, his goal is to continue with weight loss efforts. He was told that he needs to get his protein from "real food" sources and to stop all  protein supplements. High protein food handout was given. He has agreed to keep a food journal with 1500 to 1600 calories and 100+ grams of protein. Ferdinando has been instructed to work up to a goal of 150 minutes of combined cardio and strengthening exercise per week for weight loss and overall health benefits. We discussed the following Behavioral Modification Strategies today: increasing lean protein intake, decreasing simple carbohydrates, and  decrease liquid calories.   Shandon has agreed to follow up with our clinic in 4 weeks. He was informed of the importance of frequent follow up visits to maximize his success with intensive lifestyle modifications for his multiple health conditions.   OBESITY BEHAVIORAL INTERVENTION VISIT  Today's visit was # 25  Starting weight: 294 lbs Starting date: 03/04/17 Today's weight : Weight: 239 lb (108.4 kg)  Today's date: 10/11/2018 Total lbs lost to date: 26  ASK: We discussed the diagnosis of obesity with [name] today and Juanda Crumble agreed to give [name] permission to discuss obesity behavioral modification therapy today.  ASSESS: Hiroyuki has the diagnosis of obesity and his BMI today is 39.77. Dreyson is in the action stage of change.   ADVISE: Zeus was educated on the multiple health risks of obesity as well as the benefit of weight loss to improve his health. He was advised of the need for long term treatment and the importance of lifestyle modifications to improve his current health and to decrease his risk of future health problems.  AGREE: Multiple dietary modification options and treatment options were discussed and Sebert agreed to follow the recommendations documented in the above note.  ARRANGE: Rodricus was educated on the importance of frequent visits to treat obesity as outlined per CMS and USPSTF guidelines and agreed to schedule his next follow up appointment today.  I, Marcille Blanco, am acting as transcriptionist for Starlyn Skeans, MD  I have reviewed the above documentation for accuracy and completeness, and I agree with the above. -Dennard Nip, MD

## 2018-11-06 ENCOUNTER — Other Ambulatory Visit: Payer: Self-pay | Admitting: Family Medicine

## 2018-11-06 ENCOUNTER — Other Ambulatory Visit (INDEPENDENT_AMBULATORY_CARE_PROVIDER_SITE_OTHER): Payer: Self-pay | Admitting: Family Medicine

## 2018-11-06 DIAGNOSIS — E559 Vitamin D deficiency, unspecified: Secondary | ICD-10-CM

## 2018-11-09 ENCOUNTER — Ambulatory Visit (INDEPENDENT_AMBULATORY_CARE_PROVIDER_SITE_OTHER): Payer: Managed Care, Other (non HMO) | Admitting: Family Medicine

## 2018-11-09 VITALS — BP 139/82 | HR 70 | Temp 98.7°F | Ht 65.0 in | Wt 240.0 lb

## 2018-11-09 DIAGNOSIS — R0602 Shortness of breath: Secondary | ICD-10-CM

## 2018-11-09 DIAGNOSIS — Z6839 Body mass index (BMI) 39.0-39.9, adult: Secondary | ICD-10-CM

## 2018-11-09 DIAGNOSIS — Z9189 Other specified personal risk factors, not elsewhere classified: Secondary | ICD-10-CM

## 2018-11-09 DIAGNOSIS — E559 Vitamin D deficiency, unspecified: Secondary | ICD-10-CM | POA: Diagnosis not present

## 2018-11-09 DIAGNOSIS — E8881 Metabolic syndrome: Secondary | ICD-10-CM | POA: Diagnosis not present

## 2018-11-09 MED ORDER — VITAMIN D (ERGOCALCIFEROL) 1.25 MG (50000 UNIT) PO CAPS: 50000.0000 [IU] | ORAL_CAPSULE | ORAL | 1 refills | Status: DC

## 2018-11-09 MED ORDER — LIRAGLUTIDE 18 MG/3ML ~~LOC~~ SOPN: 1.2000 mg | PEN_INJECTOR | Freq: Every morning | SUBCUTANEOUS | 1 refills | Status: DC

## 2018-11-09 MED ORDER — INSULIN PEN NEEDLE 32G X 4 MM MISC: 1.0000 | Freq: Two times a day (BID) | 0 refills | Status: DC

## 2018-11-09 NOTE — Progress Notes (Signed)
Office: [phone]  /  Fax: [phone]   HPI:   Chief Complaint: OBESITY Bryan Lee is here to discuss his progress with his obesity treatment plan. He is keeping a food journal with 1600 calories and 100 grams of protein and is following his eating plan approximately 90 % of the time. He states he is doing cardio and weight lifting for 20 to 60 minutes 3 to 4 times per week. Bryan Lee continues to gain weight slowly. He is up 10 pounds this year and is very frustrated. He states that he is watching calories closely and eating healthy. His repeat RMR (which is accurate within 3 % ) was 2403, which is inconsistent with the calories that he states that he is eating.  His weight is 240 lb (108.9 kg) today and has had a weight gain of 1 pound over a period of 4 weeks since his last visit. He has lost 54 lbs since starting treatment with [name].  Insulin Resistance Bryan Lee has a diagnosis of insulin resistance based on his elevated fasting insulin level >5. Although Bryan Lee's blood glucose readings are still under good control, insulin resistance puts him at greater risk of metabolic syndrome and diabetes. He is taking metformin and Victoza currently and continues to work on diet and exercise to decrease risk of diabetes. He wonders if his insulin resistance is causing him to gain weight and asks to increase his Victoza dose. He denies nausea, vomiting, and muscle weakness.  Vitamin D deficiency Bryan Lee has a diagnosis of vitamin D deficiency. He is currently taking vit D and is stable. He denies nausea, vomiting, or muscle weakness.  Shortness of Breath with Activity Bryan Lee notes increasing shortness of breath with activity and this has greatly improved with exercise, but he would like his RMR to be rechecked as he feels he should be losing weight with his decrease in calories and increase in exercise.Marland Kitchen He notes getting out of breath sooner with activity than he used to. This has not gotten worse  recently.   ALLERGIES: Allergies  Allergen Reactions  . Lactose Intolerance (Gi) Other (See Comments)    Cramping and gas pain    MEDICATIONS: Current Outpatient Medications on File Prior to Visit  Medication Sig Dispense Refill  . celecoxib (CELEBREX) 200 MG capsule TAKE 1 CAPSULE(200 MG) BY MOUTH DAILY 90 capsule 2  . ketoconazole (NIZORAL) 2 % cream Apply 1 application topically daily. To entirety of both feet 60 g 2  . omega-3 acid ethyl esters (LOVAZA) 1 g capsule Take 2 g by mouth 2 (two) times daily.    . tamsulosin (FLOMAX) 0.4 MG CAPS capsule Take 1 capsule (0.4 mg total) by mouth daily after supper. 90 capsule 3  . TURMERIC PO Take 2 capsules by mouth daily.     No current facility-administered medications on file prior to visit.     PAST MEDICAL HISTORY: Past Medical History:  Diagnosis Date  . Achilles rupture   . Acid reflux   . Back pain   . Carpal tunnel syndrome   . Chest pain   . Constipation   . Dizzy spells   . Joint pain   . Lactose intolerance   . Lipoma   . Numbness    legs, hands  . Obesity   . Plantar fasciitis   . Shoulder pain    rotator cuff  . SOB (shortness of breath) on exertion     PAST SURGICAL HISTORY: Past Surgical History:  Procedure Laterality Date  .  ACHILLES TENDON REPAIR     left  . HERNIA REPAIR    . lump removal    . VASECTOMY      SOCIAL HISTORY: Social History   Tobacco Use  . Smoking status: Never Smoker  . Smokeless tobacco: Never Used  Substance Use Topics  . Alcohol use: Yes    Comment: social  . Drug use: No    FAMILY HISTORY: Family History  Problem Relation Age of Onset  . Coronary artery disease Father   . Heart failure Father   . Hypertension Father   . Sleep apnea Father   . Heart disease Father   . Arthritis Mother   . Obesity Mother   . Diabetes Unknown        grandmother  . Cancer Sister        GYN-type cancer  . GER disease Daughter   . GER disease Son   . Diabetes Maternal  Grandmother   . Cancer Maternal Grandmother        ovarian  . Cancer Paternal Grandmother        lung cancer, + tobacco and colon  . Seizures Sister     ROS: Review of Systems  Constitutional: Negative for weight loss.  Respiratory:       Positive for shortness of breath with exertion.  Gastrointestinal: Negative for nausea and vomiting.  Musculoskeletal:       Negative for muscle weakness.    PHYSICAL EXAM: Blood pressure 139/82, pulse 70, temperature 98.7 F (37.1 C), temperature source Oral, height 5\' 5"  (1.651 m), weight 240 lb (108.9 kg), SpO2 96 %. Body mass index is 39.94 kg/m. Physical Exam  Constitutional: He is oriented to person, place, and time. He appears well-developed and well-nourished.  Cardiovascular: Normal rate.  Pulmonary/Chest: Effort normal.  Musculoskeletal: Normal range of motion.  Neurological: He is oriented to person, place, and time.  Skin: Skin is warm and dry.  Psychiatric: He has a normal mood and affect. His behavior is normal.  Vitals reviewed.   RECENT LABS AND TESTS: BMET    Component Value Date/Time   NA 142 10/11/2018 0829   K 3.7 10/11/2018 0829   CL 97 10/11/2018 0829   CO2 29 10/11/2018 0829   GLUCOSE 82 10/11/2018 0829   GLUCOSE 123 (H) 11/07/2015 0901   BUN 16 10/11/2018 0829   CREATININE 1.03 10/11/2018 0829   CREATININE 0.89 09/08/2013 1631   CALCIUM 9.4 10/11/2018 0829   GFRNONAA 81 10/11/2018 0829   GFRAA 94 10/11/2018 0829   Lab Results  Component Value Date   HGBA1C 5.2 10/11/2018   HGBA1C 5.2 06/07/2018   HGBA1C 5.3 02/03/2018   HGBA1C 5.3 10/18/2017   HGBA1C 5.3 06/17/2017   Lab Results  Component Value Date   INSULIN 9.9 10/11/2018   INSULIN 7.2 06/07/2018   INSULIN 5.6 02/03/2018   INSULIN 10.1 10/18/2017   INSULIN 9.7 06/17/2017   CBC    Component Value Date/Time   WBC 10.2 08/05/2018 1605   WBC 8.8 11/07/2015 0901   RBC 5.25 08/05/2018 1605   RBC 5.54 11/07/2015 0901   HGB 15.1 08/05/2018  1605   HCT 45.5 08/05/2018 1605   PLT 275 08/05/2018 1605   MCV 87 08/05/2018 1605   MCH 28.8 08/05/2018 1605   MCH 27.6 09/08/2013 1631   MCHC 33.2 08/05/2018 1605   MCHC 33.1 11/07/2015 0901   RDW 14.1 08/05/2018 1605   LYMPHSABS 3.3 (H) 08/05/2018 1605   MONOABS 0.5 11/07/2015  0901   EOSABS 0.2 08/05/2018 1605   BASOSABS 0.0 08/05/2018 1605   Iron/TIBC/Ferritin/ %Sat No results found for: IRON, TIBC, FERRITIN, IRONPCTSAT Lipid Panel     Component Value Date/Time   CHOL 223 (H) 10/11/2018 0829   TRIG 69 10/11/2018 0829   HDL 42 10/11/2018 0829   CHOLHDL 6 11/07/2015 0901   VLDL 36.6 11/07/2015 0901   LDLCALC 167 (H) 10/11/2018 0829   Hepatic Function Panel     Component Value Date/Time   PROT 7.3 10/11/2018 0829   ALBUMIN 4.0 10/11/2018 0829   AST 30 10/11/2018 0829   ALT 40 10/11/2018 0829   ALKPHOS 56 10/11/2018 0829   BILITOT 0.4 10/11/2018 0829   BILIDIR 0.1 11/07/2015 0901   IBILI 0.3 09/08/2013 1631      Component Value Date/Time   TSH 0.826 06/07/2018 0758   TSH 0.735 03/04/2017 1145   TSH 0.96 11/07/2015 0901   Results for FARRON, WATROUS (MRN [id number]) as of 11/14/2018 09:40  Ref. Range 10/11/2018 08:29  Vitamin D, 25-Hydroxy Latest Ref Range: 30.0 - 100.0 ng/mL 44.6   ASSESSMENT AND PLAN: Insulin resistance - Plan: liraglutide (VICTOZA) 18 MG/3ML SOPN, Insulin Pen Needle (BD PEN NEEDLE NANO 2ND GEN) 32G X 4 MM MISC  Vitamin D deficiency - Plan: Vitamin D, Ergocalciferol, (DRISDOL) 1.25 MG (50000 UT) CAPS capsule  Shortness of breath on exertion  Class 2 severe obesity with serious comorbidity and body mass index (BMI) of 39.0 to 39.9 in adult, unspecified obesity type (HCC)  PLAN:  Insulin Resistance Bryan Lee will continue to work on weight loss, exercise, and decreasing simple carbohydrates in his diet to help decrease the risk of diabetes.  He was informed that eating too many simple carbohydrates or too many calories at one sitting  increases the likelihood of GI side effects. Bryan Lee agreed to increase Victoza to 1.2mg  a day #2 pens and pen needles with 1 refill and a prescription was written today. Bryan Lee agreed to follow up with [name] as directed to monitor his progress in 8 weeks. Diabetes risk counselling Bryan Lee was given extended (15 minutes) diabetes prevention counseling today. He is 57 y.o. male and has risk factors for diabetes including obesity. We discussed intensive lifestyle modifications today with an emphasis on weight loss as well as increasing exercise and decreasing simple carbohydrates in his diet.  Vitamin D Deficiency Bryan Lee was informed that low vitamin D levels contributes to fatigue and are associated with obesity, breast, and colon cancer. He agrees to continue to take prescription Vit D @50 ,000 IU every week #4 with 1 refill and will follow up for routine testing of vitamin D, at least 2-3 times per year. He was informed of the risk of over-replacement of vitamin D and agrees to not increase his dose unless he discusses this with [name] first. Bryan Lee agreed to follow up as directed in 8 weeks.  Shortness of Breath with Activity Bryan Lee's shortness of breath appears to be obesity related and exercise induced. The indirect calorimeter results showed VO2 of 345 and a REE of 2403. His IC shows that his RMR is 2403, which is on the high end of normal and his metabolism is not the cause of weight gain. He has agreed to work on weight loss and gradually increase exercise to treat his exercise induced shortness of breath. If Bryan Lee follows our instructions and loses weight without improvement of his shortness of breath, we will plan to refer to pulmonology. Bryan Lee agrees to this plan and  will follow up in 8 weeks.  Obesity Bryan Lee is currently in the action stage of change. As such, his goal is to continue with weight loss efforts. He has agreed to keep a food journal with 1800 calories and 120+ grams of  protein. Bryan Lee has been instructed to work up to a goal of 150 minutes of combined cardio and strengthening exercise per week for weight loss and overall health benefits. We discussed the following Behavioral Modification Strategies today: holiday eating strategies and keeping a strict food journal.  Bryan Lee has agreed to follow up with our clinic in 8 weeks. He was informed of the importance of frequent follow up visits to maximize his success with intensive lifestyle modifications for his multiple health conditions.   OBESITY BEHAVIORAL INTERVENTION VISIT  Today's visit was # 26   Starting weight: 194 lbs Starting date: 03/04/17 Today's weight : Weight: 240 lb (108.9 kg)  Today's date: 11/09/2018 Total lbs lost to date: 69  ASK: We discussed the diagnosis of obesity with Bryan Lee today and Bryan Lee agreed to give [name] permission to discuss obesity behavioral modification therapy today.  ASSESS: Bryan Lee has the diagnosis of obesity and his BMI today is 39.94. Bryan Lee is in the action stage of change.   ADVISE: Bryan Lee was educated on the multiple health risks of obesity as well as the benefit of weight loss to improve his health. He was advised of the need for long term treatment and the importance of lifestyle modifications to improve his current health and to decrease his risk of future health problems.  AGREE: Multiple dietary modification options and treatment options were discussed and Bryan Lee agreed to follow the recommendations documented in the above note.  ARRANGE: Claborn was educated on the importance of frequent visits to treat obesity as outlined per CMS and USPSTF guidelines and agreed to schedule his next follow up appointment today.  I, Bryan Lee, am acting as transcriptionist for Bryan Skeans, MD  I have reviewed the above documentation for accuracy and completeness, and I agree with the above. -Dennard Nip, MD

## 2018-11-14 ENCOUNTER — Encounter (INDEPENDENT_AMBULATORY_CARE_PROVIDER_SITE_OTHER): Payer: Self-pay | Admitting: Family Medicine

## 2018-12-15 ENCOUNTER — Encounter (INDEPENDENT_AMBULATORY_CARE_PROVIDER_SITE_OTHER): Payer: Self-pay | Admitting: Family Medicine

## 2019-01-04 ENCOUNTER — Ambulatory Visit (INDEPENDENT_AMBULATORY_CARE_PROVIDER_SITE_OTHER): Payer: Managed Care, Other (non HMO) | Admitting: Family Medicine

## 2019-01-06 ENCOUNTER — Other Ambulatory Visit (INDEPENDENT_AMBULATORY_CARE_PROVIDER_SITE_OTHER): Payer: Self-pay | Admitting: Family Medicine

## 2019-01-06 DIAGNOSIS — E8881 Metabolic syndrome: Secondary | ICD-10-CM

## 2019-01-06 DIAGNOSIS — E559 Vitamin D deficiency, unspecified: Secondary | ICD-10-CM

## 2019-01-16 ENCOUNTER — Ambulatory Visit (INDEPENDENT_AMBULATORY_CARE_PROVIDER_SITE_OTHER): Payer: Managed Care, Other (non HMO) | Admitting: Bariatrics

## 2019-01-16 ENCOUNTER — Encounter (INDEPENDENT_AMBULATORY_CARE_PROVIDER_SITE_OTHER): Payer: Self-pay | Admitting: Bariatrics

## 2019-01-16 VITALS — BP 132/80 | HR 70 | Temp 98.2°F | Ht 65.0 in | Wt 241.0 lb

## 2019-01-16 DIAGNOSIS — Z6841 Body Mass Index (BMI) 40.0 and over, adult: Secondary | ICD-10-CM

## 2019-01-16 DIAGNOSIS — E559 Vitamin D deficiency, unspecified: Secondary | ICD-10-CM | POA: Diagnosis not present

## 2019-01-16 DIAGNOSIS — Z9189 Other specified personal risk factors, not elsewhere classified: Secondary | ICD-10-CM

## 2019-01-16 DIAGNOSIS — E8881 Metabolic syndrome: Secondary | ICD-10-CM | POA: Diagnosis not present

## 2019-01-16 MED ORDER — VITAMIN D (ERGOCALCIFEROL) 1.25 MG (50000 UNIT) PO CAPS: 50000.0000 [IU] | ORAL_CAPSULE | ORAL | 1 refills | Status: AC

## 2019-01-17 NOTE — Progress Notes (Signed)
Office: [phone]  /  Fax: [phone]   HPI:   Chief Complaint: OBESITY Bryan Lee is here to discuss his progress with his obesity treatment plan. He is on the keep a food journal with 1800 calories and 120+ grams of protein daily and is following his eating plan approximately 80 % of the time. He states he is doing weights and cardio for 20 to 30 minutes 3 times per week. Bryan Lee states that he did go up over the holidays. He states that he is getting enough. He had an indirect calorimetry test in the past. His weight is 241 lb (109.3 kg) today and has had a weight gain of 1 pound over a period of 10 weeks since his last visit. He has lost 53 lbs since starting treatment with [name].  Vitamin D deficiency Bryan Lee has a diagnosis of vitamin D deficiency. He is currently taking vit D and denies nausea, vomiting or muscle weakness.  Insulin Resistance Bryan Lee has a diagnosis of insulin resistance based on his elevated fasting insulin level >5. Although Bryan Lee's blood glucose readings are still under good control, insulin resistance puts him at greater risk of metabolic syndrome and diabetes. He is taking liraglutide currently and continues to work on diet and exercise to decrease risk of diabetes.  At risk for diabetes Bryan Lee is at higher than average risk for developing diabetes due to his obesity and insulin resistance. He currently denies polyuria or polydipsia.  ASSESSMENT AND PLAN:  Vitamin D deficiency - Plan: Vitamin D, Ergocalciferol, (DRISDOL) 1.25 MG (50000 UT) CAPS capsule  Insulin resistance  At risk for diabetes mellitus  Class 3 severe obesity with serious comorbidity and body mass index (BMI) of 40.0 to 44.9 in adult, unspecified obesity type (Bryan Lee)  PLAN:  Vitamin D Deficiency Bryan Lee was informed that low vitamin D levels contributes to fatigue and are associated with obesity, breast, and colon cancer. He agrees to continue to take prescription Vit D @50 ,000 IU  every week #4 with no refills and will follow up for routine testing of vitamin D, at least 2-3 times per year. He was informed of the risk of over-replacement of vitamin D and agrees to not increase his dose unless he discusses this with [name] first. Bryan Lee agrees to follow up as directed.  Insulin Resistance Bryan Lee will continue to work on weight loss, exercise, and decreasing simple carbohydrates in his diet to help decrease the risk of diabetes. We dicussed liraglutide including benefits and risks. He was informed that eating too many simple carbohydrates or too many calories at one sitting increases the likelihood of GI side effects. Bryan Lee will continue liraglutide for now and prescription was not written today. Bryan Lee agreed to follow up with [name] as directed to monitor his progress.  Diabetes risk counseling Bryan Lee was given extended (15 minutes) diabetes prevention counseling today. He is 58 y.o. male and has risk factors for diabetes including obesity and insulin resistance. We discussed intensive lifestyle modifications today with an emphasis on weight loss as well as increasing exercise and decreasing simple carbohydrates in his diet.  Obesity Bryan Lee is currently in the action stage of change. As such, his goal is to continue with weight loss efforts He has agreed to keep a food journal with 1800 calories and 120 grams of protein daily Bryan Lee has been instructed to do more cardio and resistance exercise for weight loss and overall health benefits. We discussed the following Behavioral Modification Strategies today: increase H2O intake, keeping healthy foods in the  home, increasing lean protein intake, decreasing simple carbohydrates, increasing vegetables and work on meal planning and easy cooking plans Bryan Lee will stay with 100% of the diet and continue to drink adequate water.  Bryan Lee has agreed to follow up with our clinic in 4 weeks fasting. He was informed of the importance of  frequent follow up visits to maximize his success with intensive lifestyle modifications for his multiple health conditions.  ALLERGIES: Allergies  Allergen Reactions  . Lactose Intolerance (Gi) Other (See Comments)    Cramping and gas pain    MEDICATIONS: Current Outpatient Medications on File Prior to Visit  Medication Sig Dispense Refill  . celecoxib (CELEBREX) 200 MG capsule TAKE 1 CAPSULE(200 MG) BY MOUTH DAILY 90 capsule 2  . Insulin Pen Needle (BD PEN NEEDLE NANO 2ND GEN) 32G X 4 MM MISC 1 Package by Does not apply route 2 (two) times daily. 100 each 0  . ketoconazole (NIZORAL) 2 % cream Apply 1 application topically daily. To entirety of both feet 60 g 2  . liraglutide (VICTOZA) 18 MG/3ML SOPN Inject 0.2 mLs (1.2 mg total) into the skin every morning. 2 pen 1  . omega-3 acid ethyl esters (LOVAZA) 1 g capsule Take 2 g by mouth 2 (two) times daily.    . tamsulosin (FLOMAX) 0.4 MG CAPS capsule Take 1 capsule (0.4 mg total) by mouth daily after supper. 90 capsule 3  . TURMERIC PO Take 2 capsules by mouth daily.     No current facility-administered medications on file prior to visit.     PAST MEDICAL HISTORY: Past Medical History:  Diagnosis Date  . Achilles rupture   . Acid reflux   . Back pain   . Carpal tunnel syndrome   . Chest pain   . Constipation   . Dizzy spells   . Joint pain   . Lactose intolerance   . Lipoma   . Numbness    legs, hands  . Obesity   . Plantar fasciitis   . Shoulder pain    rotator cuff  . SOB (shortness of breath) on exertion     PAST SURGICAL HISTORY: Past Surgical History:  Procedure Laterality Date  . ACHILLES TENDON REPAIR     left  . HERNIA REPAIR    . lump removal    . VASECTOMY      SOCIAL HISTORY: Social History   Tobacco Use  . Smoking status: Never Smoker  . Smokeless tobacco: Never Used  Substance Use Topics  . Alcohol use: Yes    Comment: social  . Drug use: No    FAMILY HISTORY: Family History  Problem  Relation Age of Onset  . Coronary artery disease Father   . Heart failure Father   . Hypertension Father   . Sleep apnea Father   . Heart disease Father   . Arthritis Mother   . Obesity Mother   . Diabetes Unknown        grandmother  . Cancer Sister        GYN-type cancer  . GER disease Daughter   . GER disease Son   . Diabetes Maternal Grandmother   . Cancer Maternal Grandmother        ovarian  . Cancer Paternal Grandmother        lung cancer, + tobacco and colon  . Seizures Sister     ROS: Review of Systems  Constitutional: Negative for weight loss.  Gastrointestinal: Negative for nausea and vomiting.  Genitourinary: Negative for  frequency.  Musculoskeletal:       Negative for muscle weakness  Endo/Heme/Allergies: Negative for polydipsia.    PHYSICAL EXAM: Blood pressure 132/80, pulse 70, temperature 98.2 F (36.8 C), temperature source Oral, height 5\' 5"  (1.651 m), weight 241 lb (109.3 kg), SpO2 96 %. Body mass index is 40.1 kg/m. Physical Exam Vitals signs reviewed.  Constitutional:      Appearance: Normal appearance. He is well-developed. He is obese.  Cardiovascular:     Rate and Rhythm: Normal rate.  Pulmonary:     Effort: Pulmonary effort is normal.  Musculoskeletal: Normal range of motion.  Skin:    General: Skin is warm and dry.  Neurological:     Mental Status: He is alert and oriented to person, place, and time.  Psychiatric:        Mood and Affect: Mood normal.        Behavior: Behavior normal.     RECENT LABS AND TESTS: BMET    Component Value Date/Time   NA 142 10/11/2018 0829   K 3.7 10/11/2018 0829   CL 97 10/11/2018 0829   CO2 29 10/11/2018 0829   GLUCOSE 82 10/11/2018 0829   GLUCOSE 123 (H) 11/07/2015 0901   BUN 16 10/11/2018 0829   CREATININE 1.03 10/11/2018 0829   CREATININE 0.89 09/08/2013 1631   CALCIUM 9.4 10/11/2018 0829   GFRNONAA 81 10/11/2018 0829   GFRAA 94 10/11/2018 0829   Lab Results  Component Value Date    HGBA1C 5.2 10/11/2018   HGBA1C 5.2 06/07/2018   HGBA1C 5.3 02/03/2018   HGBA1C 5.3 10/18/2017   HGBA1C 5.3 06/17/2017   Lab Results  Component Value Date   INSULIN 9.9 10/11/2018   INSULIN 7.2 06/07/2018   INSULIN 5.6 02/03/2018   INSULIN 10.1 10/18/2017   INSULIN 9.7 06/17/2017   CBC    Component Value Date/Time   WBC 10.2 08/05/2018 1605   WBC 8.8 11/07/2015 0901   RBC 5.25 08/05/2018 1605   RBC 5.54 11/07/2015 0901   HGB 15.1 08/05/2018 1605   HCT 45.5 08/05/2018 1605   PLT 275 08/05/2018 1605   MCV 87 08/05/2018 1605   MCH 28.8 08/05/2018 1605   MCH 27.6 09/08/2013 1631   MCHC 33.2 08/05/2018 1605   MCHC 33.1 11/07/2015 0901   RDW 14.1 08/05/2018 1605   LYMPHSABS 3.3 (H) 08/05/2018 1605   MONOABS 0.5 11/07/2015 0901   EOSABS 0.2 08/05/2018 1605   BASOSABS 0.0 08/05/2018 1605   Iron/TIBC/Ferritin/ %Sat No results found for: IRON, TIBC, FERRITIN, IRONPCTSAT Lipid Panel     Component Value Date/Time   CHOL 223 (H) 10/11/2018 0829   TRIG 69 10/11/2018 0829   HDL 42 10/11/2018 0829   CHOLHDL 6 11/07/2015 0901   VLDL 36.6 11/07/2015 0901   LDLCALC 167 (H) 10/11/2018 0829   Hepatic Function Panel     Component Value Date/Time   PROT 7.3 10/11/2018 0829   ALBUMIN 4.0 10/11/2018 0829   AST 30 10/11/2018 0829   ALT 40 10/11/2018 0829   ALKPHOS 56 10/11/2018 0829   BILITOT 0.4 10/11/2018 0829   BILIDIR 0.1 11/07/2015 0901   IBILI 0.3 09/08/2013 1631      Component Value Date/Time   TSH 0.826 06/07/2018 0758   TSH 0.735 03/04/2017 1145   TSH 0.96 11/07/2015 0901     Ref. Range 10/11/2018 08:29  Vitamin D, 25-Hydroxy Latest Ref Range: 30.0 - 100.0 ng/mL 44.6     OBESITY BEHAVIORAL INTERVENTION VISIT  Today's visit  was # 27  Starting weight: 194 lbs Starting date: 03/04/2017 Today's weight : 241 lbs Today's date: 01/16/2019 Total lbs lost to date: 0   ASK: We discussed the diagnosis of obesity with Bryan Lee today and Bryan Lee agreed to give  [name] permission to discuss obesity behavioral modification therapy today.  ASSESS: Bryan Lee has the diagnosis of obesity and his BMI today is 44.1 Bryan Lee is in the action stage of change   ADVISE: Bryan Lee was educated on the multiple health risks of obesity as well as the benefit of weight loss to improve his health. He was advised of the need for long term treatment and the importance of lifestyle modifications to improve his current health and to decrease his risk of future health problems.  AGREE: Multiple dietary modification options and treatment options were discussed and  Bryan Lee agreed to follow the recommendations documented in the above note.  ARRANGE: Bryan Lee was educated on the importance of frequent visits to treat obesity as outlined per CMS and USPSTF guidelines and agreed to schedule his next follow up appointment today.  Corey Skains, am acting as Location manager for [employer]. Owens Shark, DO  I have reviewed the above documentation for accuracy and completeness, and I agree with the above. -Jearld Lesch, DO

## 2019-01-18 DIAGNOSIS — Z6841 Body Mass Index (BMI) 40.0 and over, adult: Secondary | ICD-10-CM

## 2019-02-15 ENCOUNTER — Ambulatory Visit (INDEPENDENT_AMBULATORY_CARE_PROVIDER_SITE_OTHER): Payer: Managed Care, Other (non HMO) | Admitting: Physician Assistant

## 2019-03-07 ENCOUNTER — Ambulatory Visit (INDEPENDENT_AMBULATORY_CARE_PROVIDER_SITE_OTHER): Payer: Managed Care, Other (non HMO) | Admitting: Family Medicine

## 2019-03-12 ENCOUNTER — Other Ambulatory Visit (INDEPENDENT_AMBULATORY_CARE_PROVIDER_SITE_OTHER): Payer: Self-pay | Admitting: Bariatrics

## 2019-03-12 DIAGNOSIS — E559 Vitamin D deficiency, unspecified: Secondary | ICD-10-CM

## 2019-08-28 ENCOUNTER — Ambulatory Visit (HOSPITAL_COMMUNITY)
Admission: EM | Admit: 2019-08-28 | Discharge: 2019-08-28 | Discharge status: Against Medical Advice | Payer: Managed Care, Other (non HMO)

## 2019-08-28 ENCOUNTER — Telehealth: Payer: Self-pay | Admitting: Family Medicine

## 2019-08-28 NOTE — Telephone Encounter (Signed)
Pt called in stating that he has impacted ear wax that causing him to have an ear infection. I offered a VV but pt would like to come into the office, please advise if it is ok to do an in office visit.

## 2019-08-28 NOTE — Telephone Encounter (Signed)
Ok to schedule in-office visit if screening questions negative

## 2019-08-28 NOTE — Telephone Encounter (Signed)
If he passes screening I would say ok but please check w/ Bryan Lee as this will likely be scheduled w/ him due to availability

## 2019-08-28 NOTE — Telephone Encounter (Signed)
Pt has been scheduled.  °

## 2019-08-28 NOTE — Telephone Encounter (Signed)
Ok for in office if screening is passed. 

## 2019-08-28 NOTE — Telephone Encounter (Signed)
Cody please advise on in office or VV

## 2019-08-29 ENCOUNTER — Ambulatory Visit: Payer: Managed Care, Other (non HMO) | Admitting: Physician Assistant

## 2019-08-29 ENCOUNTER — Ambulatory Visit: Payer: Managed Care, Other (non HMO) | Admitting: Family Medicine

## 2021-06-25 ENCOUNTER — Encounter: Payer: Self-pay | Admitting: *Deleted

## 2022-06-02 ENCOUNTER — Other Ambulatory Visit: Payer: Self-pay | Admitting: Orthopedic Surgery

## 2022-06-02 DIAGNOSIS — M545 Low back pain, unspecified: Secondary | ICD-10-CM

## 2022-06-13 ENCOUNTER — Other Ambulatory Visit: Payer: Managed Care, Other (non HMO)

## 2022-10-28 ENCOUNTER — Emergency Department (HOSPITAL_COMMUNITY): Payer: BC Managed Care – PPO

## 2022-10-28 ENCOUNTER — Other Ambulatory Visit: Payer: Self-pay

## 2022-10-28 ENCOUNTER — Observation Stay (HOSPITAL_COMMUNITY): Payer: BC Managed Care – PPO

## 2022-10-28 ENCOUNTER — Observation Stay (HOSPITAL_COMMUNITY)
Admission: EM | Admit: 2022-10-28 | Discharge: 2022-10-29 | Disposition: A | Discharge status: Home / Self Care | Payer: BC Managed Care – PPO | Attending: Internal Medicine | Admitting: Internal Medicine

## 2022-10-28 DIAGNOSIS — R0789 Other chest pain: Secondary | ICD-10-CM | POA: Diagnosis not present

## 2022-10-28 DIAGNOSIS — N179 Acute kidney failure, unspecified: Secondary | ICD-10-CM | POA: Insufficient documentation

## 2022-10-28 DIAGNOSIS — Z79899 Other long term (current) drug therapy: Secondary | ICD-10-CM | POA: Insufficient documentation

## 2022-10-28 DIAGNOSIS — E876 Hypokalemia: Secondary | ICD-10-CM | POA: Insufficient documentation

## 2022-10-28 DIAGNOSIS — Z794 Long term (current) use of insulin: Secondary | ICD-10-CM | POA: Diagnosis not present

## 2022-10-28 DIAGNOSIS — R7989 Other specified abnormal findings of blood chemistry: Secondary | ICD-10-CM

## 2022-10-28 DIAGNOSIS — R079 Chest pain, unspecified: Secondary | ICD-10-CM | POA: Diagnosis not present

## 2022-10-28 DIAGNOSIS — N390 Urinary tract infection, site not specified: Secondary | ICD-10-CM | POA: Insufficient documentation

## 2022-10-28 DIAGNOSIS — R569 Unspecified convulsions: Principal | ICD-10-CM

## 2022-10-28 LAB — URINALYSIS, ROUTINE W REFLEX MICROSCOPIC
Bilirubin Urine: NEGATIVE
Glucose, UA: NEGATIVE mg/dL
Ketones, ur: NEGATIVE mg/dL
Leukocytes,Ua: NEGATIVE
Nitrite: NEGATIVE
Protein, ur: 100 mg/dL — AB
Specific Gravity, Urine: 1.013 (ref 1.005–1.030)
pH: 5 (ref 5.0–8.0)

## 2022-10-28 LAB — CBC WITH DIFFERENTIAL/PLATELET
Abs Immature Granulocytes: 0.23 10*3/uL — ABNORMAL HIGH (ref 0.00–0.07)
Basophils Absolute: 0.1 10*3/uL (ref 0.0–0.1)
Basophils Relative: 0 %
Eosinophils Absolute: 0.2 10*3/uL (ref 0.0–0.5)
Eosinophils Relative: 1 %
HCT: 46.4 % (ref 39.0–52.0)
Hemoglobin: 15.4 g/dL (ref 13.0–17.0)
Immature Granulocytes: 1 %
Lymphocytes Relative: 14 %
Lymphs Abs: 2.5 10*3/uL (ref 0.7–4.0)
MCH: 28.8 pg (ref 26.0–34.0)
MCHC: 33.2 g/dL (ref 30.0–36.0)
MCV: 86.7 fL (ref 80.0–100.0)
Monocytes Absolute: 0.7 10*3/uL (ref 0.1–1.0)
Monocytes Relative: 4 %
Neutro Abs: 14.9 10*3/uL — ABNORMAL HIGH (ref 1.7–7.7)
Neutrophils Relative %: 80 %
Platelets: 312 10*3/uL (ref 150–400)
RBC: 5.35 MIL/uL (ref 4.22–5.81)
RDW: 13.6 % (ref 11.5–15.5)
WBC: 18.5 10*3/uL — ABNORMAL HIGH (ref 4.0–10.5)
nRBC: 0 % (ref 0.0–0.2)

## 2022-10-28 LAB — RAPID URINE DRUG SCREEN, HOSP PERFORMED
Amphetamines: NOT DETECTED
Barbiturates: NOT DETECTED
Benzodiazepines: POSITIVE — AB
Cocaine: NOT DETECTED
Opiates: NOT DETECTED
Tetrahydrocannabinol: NOT DETECTED

## 2022-10-28 LAB — TROPONIN I (HIGH SENSITIVITY)
Troponin I (High Sensitivity): 21 ng/L — ABNORMAL HIGH (ref <18)
Troponin I (High Sensitivity): 62 ng/L — ABNORMAL HIGH (ref <18)
Troponin I (High Sensitivity): 86 ng/L — ABNORMAL HIGH (ref <18)
Troponin I (High Sensitivity): 75 ng/L — ABNORMAL HIGH (ref <18)

## 2022-10-28 LAB — I-STAT VENOUS BLOOD GAS, ED
Acid-base deficit: 5 mmol/L — ABNORMAL HIGH (ref 0.0–2.0)
Bicarbonate: 20.8 mmol/L (ref 20.0–28.0)
Calcium, Ion: 1.06 mmol/L — ABNORMAL LOW (ref 1.15–1.40)
HCT: 48 % (ref 39.0–52.0)
Hemoglobin: 16.3 g/dL (ref 13.0–17.0)
O2 Saturation: 93 %
Potassium: 3.2 mmol/L — ABNORMAL LOW (ref 3.5–5.1)
Sodium: 135 mmol/L (ref 135–145)
TCO2: 22 mmol/L (ref 22–32)
pCO2, Ven: 40.1 mmHg — ABNORMAL LOW (ref 44–60)
pH, Ven: 7.323 (ref 7.25–7.43)
pO2, Ven: 70 mmHg — ABNORMAL HIGH (ref 32–45)

## 2022-10-28 LAB — COMPREHENSIVE METABOLIC PANEL
ALT: 53 U/L — ABNORMAL HIGH (ref 0–44)
AST: 57 U/L — ABNORMAL HIGH (ref 15–41)
Albumin: 3.4 g/dL — ABNORMAL LOW (ref 3.5–5.0)
Alkaline Phosphatase: 53 U/L (ref 38–126)
Anion gap: 23 — ABNORMAL HIGH (ref 5–15)
BUN: 16 mg/dL (ref 6–20)
CO2: 17 mmol/L — ABNORMAL LOW (ref 22–32)
Calcium: 8.8 mg/dL — ABNORMAL LOW (ref 8.9–10.3)
Chloride: 93 mmol/L — ABNORMAL LOW (ref 98–111)
Creatinine, Ser: 1.29 mg/dL — ABNORMAL HIGH (ref 0.61–1.24)
GFR, Estimated: >60 mL/min (ref >60)
Glucose, Bld: 225 mg/dL — ABNORMAL HIGH (ref 70–99)
Potassium: 3.2 mmol/L — ABNORMAL LOW (ref 3.5–5.1)
Sodium: 133 mmol/L — ABNORMAL LOW (ref 135–145)
Total Bilirubin: 0.7 mg/dL (ref 0.3–1.2)
Total Protein: 7.8 g/dL (ref 6.5–8.1)

## 2022-10-28 LAB — BLOOD GAS, VENOUS
Acid-Base Excess: 12.3 mmol/L — ABNORMAL HIGH (ref 0.0–2.0)
Bicarbonate: 35.9 mmol/L — ABNORMAL HIGH (ref 20.0–28.0)
O2 Saturation: 88.1 %
Patient temperature: 37.6
pCO2, Ven: 42 mmHg — ABNORMAL LOW (ref 44–60)
pH, Ven: 7.54 — ABNORMAL HIGH (ref 7.25–7.43)
pO2, Ven: 56 mmHg — ABNORMAL HIGH (ref 32–45)

## 2022-10-28 LAB — LACTIC ACID, PLASMA
Lactic Acid, Venous: >9 mmol/L (ref 0.5–1.9)
Lactic Acid, Venous: 3 mmol/L (ref 0.5–1.9)
Lactic Acid, Venous: 2 mmol/L (ref 0.5–1.9)
Lactic Acid, Venous: 1.8 mmol/L (ref 0.5–1.9)

## 2022-10-28 LAB — MAGNESIUM: Magnesium: 2.4 mg/dL (ref 1.7–2.4)

## 2022-10-28 LAB — HIV ANTIBODY (ROUTINE TESTING W REFLEX): HIV Screen 4th Generation wRfx: NONREACTIVE

## 2022-10-28 MED ORDER — ATORVASTATIN CALCIUM 40 MG PO TABS
40.0000 mg | ORAL_TABLET | Freq: Every day | ORAL | Status: DC
  Administered 2022-10-28 – 2022-10-29 (×2): 40 mg via ORAL
  Filled 2022-10-28 (×2): qty 1

## 2022-10-28 MED ORDER — LEVETIRACETAM 500 MG PO TABS
500.0000 mg | ORAL_TABLET | Freq: Two times a day (BID) | ORAL | Status: DC
  Administered 2022-10-29: 500 mg via ORAL
  Filled 2022-10-28: qty 1

## 2022-10-28 MED ORDER — POTASSIUM CHLORIDE CRYS ER 20 MEQ PO TBCR
40.0000 meq | EXTENDED_RELEASE_TABLET | Freq: Once | ORAL | Status: AC
  Administered 2022-10-28: 40 meq via ORAL
  Filled 2022-10-28: qty 2

## 2022-10-28 MED ORDER — ACETAMINOPHEN 650 MG RE SUPP: 650.0000 mg | Freq: Four times a day (QID) | RECTAL | Status: DC | PRN

## 2022-10-28 MED ORDER — ACETAMINOPHEN 325 MG PO TABS
650.0000 mg | ORAL_TABLET | Freq: Four times a day (QID) | ORAL | Status: DC | PRN
  Administered 2022-10-28: 650 mg via ORAL
  Filled 2022-10-28: qty 2

## 2022-10-28 MED ORDER — SODIUM CHLORIDE 0.9 % IV SOLN
2000.0000 mg | Freq: Once | INTRAVENOUS | Status: AC
  Administered 2022-10-28: 2000 mg via INTRAVENOUS
  Filled 2022-10-28: qty 20

## 2022-10-28 MED ORDER — ENOXAPARIN SODIUM 40 MG/0.4ML IJ SOSY: 40.0000 mg | PREFILLED_SYRINGE | INTRAMUSCULAR | Status: DC

## 2022-10-28 MED ORDER — LIDOCAINE HCL (PF) 1 % IJ SOLN
INTRAMUSCULAR | Status: AC
  Filled 2022-10-28: qty 30

## 2022-10-28 MED ORDER — POTASSIUM CHLORIDE 20 MEQ PO PACK
40.0000 meq | PACK | Freq: Every day | ORAL | Status: DC
  Administered 2022-10-28: 40 meq via ORAL
  Filled 2022-10-28: qty 2

## 2022-10-28 NOTE — Progress Notes (Signed)
EEG complete - results pending 

## 2022-10-28 NOTE — ED Notes (Signed)
Patient transported to CT 

## 2022-10-28 NOTE — H&P (Signed)
Date: 10/28/2022               Patient Name:  Bryan Lee MRN: [id number]  DOB: 1961/06/10 Age / Sex: 61 y.o., male   PCP: Midge Minium, MD         Medical Service: Internal Medicine Teaching Service         Attending Physician: Dr. Lucious Groves, DO    First Contact: Dr. Vena Rua Pager: [phone]  Second Contact: Dr. Buddy Duty  Pager: [phone]       After Hours (After 5p/  First Contact Pager: [phone]  weekends / holidays): Second Contact Pager: [phone]   Chief Complaint: seizure  History of Present Illness:  Semaj Coburn is a 61 year old man with a history of hyperlipidemia, right diaphragmatic palsy, OSA, obesity, shingles who presents today for seizure this morning around 4 AM.  Patient reports waking up in the night to go to the bathroom.  He took off his CPAP machine and then went back to bed.  He did take Robitussin and over-the-counter sleep medication due to having URI type symptoms for the last week and a half.  He remembers rolling off the bed and hitting his glabella but does not remember much after that.  Wife who is sleeping in bed with him heard patient mumbling and noticed that he had fallen off the bed.  She noted jerking in his arms and was unable to arouse the patient.  She called her daughter who is a Chartered certified accountant who lives with them who check on him.  Family was unable to arouse him with sternal rub.  Daughter reports patient was snoring throughout this episode.  Wife believes the shaking lasted about 2 to 3 minutes.  EMS was called and advised chest compressions even though patient never lost pulse or stopped breathing.  He was dosed with Versed after EMS arrived approximately 30 minutes later.  Patient was able to stand up and walk into the ambulance after this although he remained confused and postictal, patient since then has returned to baseline per family.  Patient reports he feels slightly tired and has some nasal congestion but otherwise feels  at his baseline.    Aside from recent URI symptoms reports he is in good health.  Does note for the past 5 to 10 years he has had yearly episodes lasting about 10 to 15 minutes where he feels that time is moving slower than usual.  Has seen neurology as an outpatient regarding this.  No seizure or other obvious neurological pathology was noted per patient.  Did say he took topiramate for the first time in a year to help with weight loss last night but otherwise no new changes in his medications.  Patient denies fevers, chills, abdominal pain, dysuria, loss of bowel or bladder control, tongue laceration.  Meds:  Current Meds  Medication Sig   atorvastatin (LIPITOR) 40 MG tablet Take 40 mg by mouth daily.   omega-3 acid ethyl esters (LOVAZA) 1 g capsule Take 2 g by mouth 2 (two) times daily.   phentermine 37.5 MG capsule Take 37.5 mg by mouth daily.   topiramate (TOPAMAX) 25 MG tablet Take 25 mg by mouth daily.   valACYclovir (VALTREX) 1000 MG tablet Take 1,000 mg by mouth 2 (two) times daily.   Vitamin D, Ergocalciferol, (DRISDOL) 1.25 MG (50000 UT) CAPS capsule Take 1 capsule (50,000 Units total) by mouth every 7 (seven) days. (Patient taking differently: Take 50,000 Units  by mouth every 7 (seven) days. Monday)   Allergies: Allergies as of 10/28/2022 - Review Complete 10/28/2022  Allergen Reaction Noted   Lactose intolerance (gi) Other (See Comments) 03/04/2017   Past Medical History:  Diagnosis Date   Achilles rupture    Acid reflux    Back pain    Carpal tunnel syndrome    Chest pain    Constipation    Dizzy spells    Joint pain    Lactose intolerance    Lipoma    Numbness    legs, hands   Obesity    Plantar fasciitis    Shoulder pain    rotator cuff   SOB (shortness of breath) on exertion     Family History: Sister with seizure(adult onset), GU cancer in another sister, Father with alcohol use, and depression. Mother with tremor, DM, OA  Social History: [place] and [place], work as Engineer, maintenance of operation at [employer]. No tobacco use. Had [place] 2 years ago drink every couple months last drink more than 5 months ago.  Follows with [facility] with Dr. Ronnald Ramp.  Review of Systems: A complete ROS was negative except as per HPI.   Physical Exam: Blood pressure 128/69, pulse 97, temperature 98 F (36.7 C), resp. rate 20, height '5\' 6"'$  (1.676 m), weight 127 kg, SpO2 99 %. Constitutional: Appears well-developed and well-nourished. No distress.  Sitting up in bed  HENT: Contusion of the medial forehead about 1.5 cm.  Laceration along left nasal bridge with 2 sutures small amount of dried blood Cardiovascular: Normal rate, regular rhythm, no murmurs.  Mild reproducible pain in the sternal area. Respiratory: No respiratory distress, no accessory muscle use.  Effort is normal.  Lungs are clear to auscultation bilaterally. GI: Nondistended, soft, nontender to palpation, normal active bowel sounds Musculoskeletal: Normal bulk and tone.  No peripheral edema noted. Neurological: Is alert and oriented x4, no apparent focal deficits noted.  Normal strength and sensation.  Speech is clear Skin: Hyperpigmented scattered macules and patches of the left shoulder with small amount of scaling no active vesicles.  No tongue laceration Psychiatric: Normal mood and affect. Behavior is normal. Judgment and thought content normal.    EKG: personally reviewed my interpretation is sinus rhythm rate 109 no acute ischemic changes.  MR BRAIN WO CONTRAST  Result Date: 10/28/2022 CLINICAL DATA:  Provided history: Seizure, generalized, normal neuro exam. Additional history provided: Altered mental status, seizure-like activity. EXAM: MRI HEAD WITHOUT CONTRAST TECHNIQUE: Multiplanar, multiecho pulse sequences of the brain and surrounding structures were obtained without intravenous contrast. COMPARISON:  Head CT 10/28/2022. FINDINGS: Intermittently  motion degraded examination (with up to moderate motion degradation of the acquired sequences). Brain: Cerebral volume is normal. Mild multifocal T2 FLAIR hyperintense signal abnormality within the cerebral white matter, nonspecific but most often secondary to chronic small vessel ischemia. No cortical encephalomalacia is identified. No evidence of mesial temporal sclerosis. There is no acute infarct. No evidence of an intracranial mass. No chronic intracranial blood products. No extra-axial fluid collection. No midline shift. Vascular: Maintained flow voids within the proximal large arterial vessels. Skull and upper cervical spine: No focal suspicious marrow lesion. Sinuses/Orbits: Age-indeterminate right orbital floor fracture. 1.9 cm mucous retention cyst within the left maxillary sinus. Mild mucosal thickening, and small mucous retention cyst, within the left sphenoid sinus. Small fluid level within the right sphenoid sinus. IMPRESSION: 1. No evidence of acute intracranial abnormality. 2. No specific seizure focus  is identified. 3. Mild multifocal T2 FLAIR hyperintense signal abnormality within the cerebral white matter, nonspecific but most often secondary to chronic small vessel ischemia. 4. Age-indeterminate right orbital floor fracture. 5. Paranasal sinus disease, as described. Electronically Signed   By: Kellie Simmering D.O.   On: 10/28/2022 13:54   CT Head Wo Contrast  Result Date: 10/28/2022 CLINICAL DATA:  Fall.  Delirium. EXAM: CT HEAD WITHOUT CONTRAST CT MAXILLOFACIAL WITHOUT CONTRAST CT CERVICAL SPINE WITHOUT CONTRAST TECHNIQUE: Multidetector CT imaging of the head, cervical spine, and maxillofacial structures were performed using the standard protocol without intravenous contrast. Multiplanar CT image reconstructions of the cervical spine and maxillofacial structures were also generated. RADIATION DOSE REDUCTION: This exam was performed according to the departmental dose-optimization program which  includes automated exposure control, adjustment of the mA and/or kV according to patient size and/or use of iterative reconstruction technique. COMPARISON:  None Available. FINDINGS: CT HEAD FINDINGS Brain: No evidence of acute infarction, hemorrhage, hydrocephalus, extra-axial collection or mass lesion/mass effect. Vascular: No hyperdense vessel or unexpected calcification. Skull: Normal. Negative for fracture or focal lesion. Other: Signs of frontal scalp contusion noted. CT MAXILLOFACIAL FINDINGS Osseous: Age-indeterminate right floor of orbit fracture identified, image 43/12 and image 27/11. The remaining osseous structures appear intact. Orbits: Negative. No traumatic or inflammatory finding. Sinuses: Mucous retention cyst versus polyp noted in the superior aspect of the left maxillary sinus measuring 1.8 cm. No air-fluid levels identified within the sinuses. Soft tissues: Negative. CT CERVICAL SPINE FINDINGS Alignment: Mild reversal of normal cervical lordosis may reflect patient positioning or muscle spasm. No sign of acute posttraumatic malalignment of the cervical spine. Skull base and vertebrae: No acute fracture. No primary bone lesion or focal pathologic process. Soft tissues and spinal canal: No prevertebral fluid or swelling. No visible canal hematoma. Disc levels: Disc spaces are well preserved. No significant degenerative change. Upper chest: Negative. Other: None IMPRESSION: 1. No acute intracranial abnormalities. 2. Age-indeterminate right floor of orbit fracture. 3. No evidence for cervical spine fracture or subluxation. 4. Frontal scalp contusion. Electronically Signed   By: Kerby Moors M.D.   On: 10/28/2022 08:08   CT Maxillofacial Wo Contrast  Result Date: 10/28/2022 CLINICAL DATA:  Fall.  Delirium. EXAM: CT HEAD WITHOUT CONTRAST CT MAXILLOFACIAL WITHOUT CONTRAST CT CERVICAL SPINE WITHOUT CONTRAST TECHNIQUE: Multidetector CT imaging of the head, cervical spine, and maxillofacial  structures were performed using the standard protocol without intravenous contrast. Multiplanar CT image reconstructions of the cervical spine and maxillofacial structures were also generated. RADIATION DOSE REDUCTION: This exam was performed according to the departmental dose-optimization program which includes automated exposure control, adjustment of the mA and/or kV according to patient size and/or use of iterative reconstruction technique. COMPARISON:  None Available. FINDINGS: CT HEAD FINDINGS Brain: No evidence of acute infarction, hemorrhage, hydrocephalus, extra-axial collection or mass lesion/mass effect. Vascular: No hyperdense vessel or unexpected calcification. Skull: Normal. Negative for fracture or focal lesion. Other: Signs of frontal scalp contusion noted. CT MAXILLOFACIAL FINDINGS Osseous: Age-indeterminate right floor of orbit fracture identified, image 43/12 and image 27/11. The remaining osseous structures appear intact. Orbits: Negative. No traumatic or inflammatory finding. Sinuses: Mucous retention cyst versus polyp noted in the superior aspect of the left maxillary sinus measuring 1.8 cm. No air-fluid levels identified within the sinuses. Soft tissues: Negative. CT CERVICAL SPINE FINDINGS Alignment: Mild reversal of normal cervical lordosis may reflect patient positioning or muscle spasm. No sign of acute posttraumatic malalignment of the cervical spine. Skull base and vertebrae:  No acute fracture. No primary bone lesion or focal pathologic process. Soft tissues and spinal canal: No prevertebral fluid or swelling. No visible canal hematoma. Disc levels: Disc spaces are well preserved. No significant degenerative change. Upper chest: Negative. Other: None IMPRESSION: 1. No acute intracranial abnormalities. 2. Age-indeterminate right floor of orbit fracture. 3. No evidence for cervical spine fracture or subluxation. 4. Frontal scalp contusion. Electronically Signed   By: Kerby Moors M.D.    On: 10/28/2022 08:08   CT Cervical Spine Wo Contrast  Result Date: 10/28/2022 CLINICAL DATA:  Fall.  Delirium. EXAM: CT HEAD WITHOUT CONTRAST CT MAXILLOFACIAL WITHOUT CONTRAST CT CERVICAL SPINE WITHOUT CONTRAST TECHNIQUE: Multidetector CT imaging of the head, cervical spine, and maxillofacial structures were performed using the standard protocol without intravenous contrast. Multiplanar CT image reconstructions of the cervical spine and maxillofacial structures were also generated. RADIATION DOSE REDUCTION: This exam was performed according to the departmental dose-optimization program which includes automated exposure control, adjustment of the mA and/or kV according to patient size and/or use of iterative reconstruction technique. COMPARISON:  None Available. FINDINGS: CT HEAD FINDINGS Brain: No evidence of acute infarction, hemorrhage, hydrocephalus, extra-axial collection or mass lesion/mass effect. Vascular: No hyperdense vessel or unexpected calcification. Skull: Normal. Negative for fracture or focal lesion. Other: Signs of frontal scalp contusion noted. CT MAXILLOFACIAL FINDINGS Osseous: Age-indeterminate right floor of orbit fracture identified, image 43/12 and image 27/11. The remaining osseous structures appear intact. Orbits: Negative. No traumatic or inflammatory finding. Sinuses: Mucous retention cyst versus polyp noted in the superior aspect of the left maxillary sinus measuring 1.8 cm. No air-fluid levels identified within the sinuses. Soft tissues: Negative. CT CERVICAL SPINE FINDINGS Alignment: Mild reversal of normal cervical lordosis may reflect patient positioning or muscle spasm. No sign of acute posttraumatic malalignment of the cervical spine. Skull base and vertebrae: No acute fracture. No primary bone lesion or focal pathologic process. Soft tissues and spinal canal: No prevertebral fluid or swelling. No visible canal hematoma. Disc levels: Disc spaces are well preserved. No significant  degenerative change. Upper chest: Negative. Other: None IMPRESSION: 1. No acute intracranial abnormalities. 2. Age-indeterminate right floor of orbit fracture. 3. No evidence for cervical spine fracture or subluxation. 4. Frontal scalp contusion. Electronically Signed   By: Kerby Moors M.D.   On: 10/28/2022 08:08   CT Chest Wo Contrast  Result Date: 10/28/2022 CLINICAL DATA:  Evaluate for rib fracture. EXAM: CT CHEST WITHOUT CONTRAST TECHNIQUE: Multidetector CT imaging of the chest was performed following the standard protocol without IV contrast. RADIATION DOSE REDUCTION: This exam was performed according to the departmental dose-optimization program which includes automated exposure control, adjustment of the mA and/or kV according to patient size and/or use of iterative reconstruction technique. COMPARISON:  CT AP 02/07/2021. FINDINGS: Cardiovascular: Normal heart size. No pericardial effusion. Mild aortic atherosclerotic calcification. Mediastinum/Nodes: No enlarged axillary or mediastinal lymph nodes. Thyroid gland, and trachea are unremarkable. Patulous esophagus noted which may reflect chronic dysmotility. Lungs/Pleura: Unchanged chronic asymmetric elevation of the right hemidiaphragm. Mild subsegmental atelectasis is identified within the right middle lobe and both lung bases. No pleural effusion, airspace consolidation, or signs of pneumothorax Upper Abdomen: Diffuse hepatic steatosis. Cyst within the right lobe of liver is again measuring 1.5 cm, image 114/4. Musculoskeletal: No chest wall mass or suspicious bone lesions identified. No rib fractures identified. IMPRESSION: 1. No rib fractures identified. 2. Unchanged chronic asymmetric elevation of the right hemidiaphragm. 3. Hepatic steatosis. 4. 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  5.  Aortic Atherosclerosis (ICD10-I70.0). Electronically Signed   By: Kerby Moors M.D.   On: 10/28/2022 07:52   DG Chest Portable 1  View  Result Date: 10/28/2022 CLINICAL DATA:  Fall, rule out fracture EXAM: PORTABLE CHEST 1 VIEW COMPARISON:  06/20/2011 FINDINGS: Limited low volume chest with extensive artifact from EKG leads. Prominent heart size accentuated by technique. Indistinct density at the bases favoring atelectasis. No visible effusion or pneumothorax. No gross fracture. IMPRESSION: Limited low volume chest with presumed atelectasis. No focal finding Electronically Signed   By: Jorje Guild M.D.   On: 10/28/2022 06:05     Assessment & Plan by Problem: Principal Problem:   Generalized seizure ([place])   Generalized seizure No prior history of seizures in the past.  Now back at baseline.  No obvious cause of his seizure.  Did take Robitussin-DM as well as an over-the-counter sleep aid which may have made him more difficult to arouse.  No alcohol use or illicit drug use per patient MRI without acute abnormalities.  EEG read pending.  Lactic acid noted to be 9 improved to 2.  UA consistent with myoglobinuria.  These may be related to seizure.  No muscle pain to suggest myopathy. -Neurology following -Follow-up EEG -Seizure precautions  Chest pain Do not think this is cardiac in nature.  Musculoskeletal pain in the setting of getting chest compressions by daughter this morning.  Patient reports he has a dull aching sensation that is constant but minimal currently.  Troponin noted to be elevated from 21>62.  EKG with tachycardia which is now resolved.  No signs of ischemic changes.  Suspect elevation due to chest compressions. -Trend troponin  Hypokalemia Potassium of 3.2.  Reports she has a history of this and takes potassium supplements at home.  Is not on a diuretic.  Does not appear to have overt GI losses.  Mag is 2.4. -Replete potassium  Elevated creatinine  Unknown baseline creatinine.  Serum creatinine of 1.29 on admission.  No urinary symptoms.  Did have recent AKI and possible seizure which may be  contributing to this versus possible underlying CKD.  Also noted to have mild hyponatremia of the patient reports has not been eating and drinking well in the setting of recent URI. -Monitor BMP  URI Patient reports cough and nasal drainage for the past 1.5 weeks.  Feels that this is improving and he is near baseline.  Home COVID test was negative.  Leukocytosis of 18.5 which could be reactive from seizure.  He remains afebrile with stable vitals.  Likely has a viral URI that is close to resolved. Will continue to monitor him while he is admitted.  OSA -CPAP at night  Hyperlipidemia Continue statin  History of shingles Shingles outbreak 1 month ago was taking valacyclovir which is now completed.  Does have some hyperpigmentation of the left shoulder.  No vesicular lesions  Dispo: Admit patient to Observation with expected length of stay less than 2 midnights.  Signed: Iona Beard, MD 10/28/2022, 4:50 PM  Pager: [phone] After 5pm on weekdays and 1pm on weekends: On Call pager: [phone]

## 2022-10-28 NOTE — ED Notes (Signed)
EEG at bedside.

## 2022-10-28 NOTE — ED Provider Notes (Signed)
[facility] EMERGENCY DEPARTMENT Provider Note  CSN: [id number] Arrival date & time: 10/28/22 6295  Chief Complaint(s) Fall and Altered Mental Status  HPI Bryan Lee is a 61 y.o. male with PMH obesity, OSA on CPAP who presents emergency department for evaluation of a fall, altered mental status and seizure-like behavior.  History obtained from patient's wife and daughter who states that they found the patient on the floor with upper and lower extremity shaking after he had fallen out of the bed and struck his face on the ground.  They called EMS and apparently the operator on the phone told the patient's daughter to administer chest compressions despite the patient having a pulse.  Patient's daughter is an ER tech and performed appropriate compressions.  Patient's total shaking episode lasted about 5 minutes but on arrival, patient confused and combative requiring 5 mg of Versed administration by EMS.  Patient arrives tachycardic, hypoxic and somnolent but will arouse to loud questioning and answer questions appropriately.  He arrives with a small laceration to the glabella and dried blood on the face.   Past Medical History Past Medical History:  Diagnosis Date   Achilles rupture    Acid reflux    Back pain    Carpal tunnel syndrome    Chest pain    Constipation    Dizzy spells    Joint pain    Lactose intolerance    Lipoma    Numbness    legs, hands   Obesity    Plantar fasciitis    Shoulder pain    rotator cuff   SOB (shortness of breath) on exertion    Patient Active Problem List   Diagnosis Date Noted   Class 3 severe obesity with serious comorbidity and body mass index (BMI) of 40.0 to 44.9 in adult [facility]) 01/18/2019   Other hyperlipidemia 09/06/2017   Prediabetes 08/16/2017   Class 2 obesity without serious comorbidity with body mass index (BMI) of 39.0 to 39.9 in adult 08/16/2017   Vitamin D deficiency 03/18/2017   Paresthesia of right leg  11/02/2015   Morbid obesity ([place]) 11/02/2015   Left shoulder pain 04/09/2014   Routine general medical examination at a health care facility 09/08/2013   Hematoma 07/06/2011   Breast pain, left 07/06/2011   LYMPH NODE-ENLARGED 04/05/2009   NEOPLASMS UNSPEC NATURE BONE SOFT TISSUE&SKIN 02/20/2009   Home Medication(s) Prior to Admission medications   Medication Sig Start Date End Date Taking? Authorizing Provider  celecoxib (CELEBREX) 200 MG capsule TAKE 1 CAPSULE(200 MG) BY MOUTH DAILY 11/07/18   Shawnee Knapp, MD  Insulin Pen Needle (BD PEN NEEDLE NANO 2ND GEN) 32G X 4 MM MISC 1 Package by Does not apply route 2 (two) times daily. 11/09/18   Dennard Nip D, MD  ketoconazole (NIZORAL) 2 % cream Apply 1 application topically daily. To entirety of both feet 08/05/18   Shawnee Knapp, MD  liraglutide (VICTOZA) 18 MG/3ML SOPN Inject 0.2 mLs (1.2 mg total) into the skin every morning. 11/09/18   Dennard Nip D, MD  omega-3 acid ethyl esters (LOVAZA) 1 g capsule Take 2 g by mouth 2 (two) times daily.    [provider]  tamsulosin (FLOMAX) 0.4 MG CAPS capsule Take 1 capsule (0.4 mg total) by mouth daily after supper. 08/05/18   Shawnee Knapp, MD  TURMERIC PO Take 2 capsules by mouth daily.    [provider]  Vitamin D, Ergocalciferol, (DRISDOL) 1.25 MG (50000 UT) CAPS  capsule Take 1 capsule (50,000 Units total) by mouth every 7 (seven) days. 01/16/19   Georgia Lopes, DO                                                                                                                                    Past Surgical History Past Surgical History:  Procedure Laterality Date   ACHILLES TENDON REPAIR     left   HERNIA REPAIR     lump removal     VASECTOMY     Family History Family History  Problem Relation Age of Onset   Coronary artery disease Father    Heart failure Father    Hypertension Father    Sleep apnea Father    Heart disease Father    Arthritis Mother    Obesity Mother     Diabetes Unknown        grandmother   Cancer Sister        GYN-type cancer   GER disease Daughter    GER disease Son    Diabetes Maternal Grandmother    Cancer Maternal Grandmother        ovarian   Cancer Paternal Grandmother        lung cancer, + tobacco and colon   Seizures Sister     Social History Social History   Tobacco Use   Smoking status: Never   Smokeless tobacco: Never  Vaping Use   Vaping Use: Never used  Substance Use Topics   Alcohol use: Yes    Comment: social   Drug use: No   Allergies Lactose intolerance (gi)  Review of Systems Review of Systems  Neurological:  Positive for seizures.  Psychiatric/Behavioral:  Positive for confusion.     Physical Exam Vital Signs  I have reviewed the triage vital signs BP (!) 101/58   Pulse 100   Resp 18   Ht '5\' 6"'$  (1.676 m)   Wt 127 kg   SpO2 93%   BMI 45.19 kg/m   Physical Exam Constitutional:      General: He is not in acute distress.    Appearance: Normal appearance.  HENT:     Head: Normocephalic.     Comments: 1 cm laceration to the glabella    Nose: No congestion or rhinorrhea.  Eyes:     General:        Right eye: No discharge.        Left eye: No discharge.     Extraocular Movements: Extraocular movements intact.     Pupils: Pupils are equal, round, and reactive to light.  Cardiovascular:     Rate and Rhythm: Normal rate and regular rhythm.     Heart sounds: No murmur heard. Pulmonary:     Effort: No respiratory distress.     Breath sounds: No wheezing or rales.  Abdominal:     General: There is no distension.  Tenderness: There is abdominal tenderness.  Musculoskeletal:        General: Normal range of motion.     Cervical back: Normal range of motion.  Skin:    General: Skin is warm and dry.  Neurological:     General: No focal deficit present.     Mental Status: He is alert.     ED Results and Treatments Labs (all labs ordered are listed, but only abnormal results  are displayed) Labs Reviewed  CBC WITH DIFFERENTIAL/PLATELET - Abnormal; Notable for the following components:      Result Value   WBC 18.5 (*)    Neutro Abs 14.9 (*)    Abs Immature Granulocytes 0.23 (*)    All other components within normal limits  I-STAT VENOUS BLOOD GAS, ED - Abnormal; Notable for the following components:   pCO2, Ven 40.1 (*)    pO2, Ven 70 (*)    Acid-base deficit 5.0 (*)    Potassium 3.2 (*)    Calcium, Ion 1.06 (*)    All other components within normal limits  CULTURE, BLOOD (ROUTINE X 2)  CULTURE, BLOOD (ROUTINE X 2)  COMPREHENSIVE METABOLIC PANEL  URINALYSIS, ROUTINE W REFLEX MICROSCOPIC  BLOOD GAS, VENOUS  RAPID URINE DRUG SCREEN, HOSP PERFORMED  LACTIC ACID, PLASMA  LACTIC ACID, PLASMA  TROPONIN I (HIGH SENSITIVITY)  TROPONIN I (HIGH SENSITIVITY)                                                                                                                          Radiology DG Chest Portable 1 View  Result Date: 10/28/2022 CLINICAL DATA:  Fall, rule out fracture EXAM: PORTABLE CHEST 1 VIEW COMPARISON:  06/20/2011 FINDINGS: Limited low volume chest with extensive artifact from EKG leads. Prominent heart size accentuated by technique. Indistinct density at the bases favoring atelectasis. No visible effusion or pneumothorax. No gross fracture. IMPRESSION: Limited low volume chest with presumed atelectasis. No focal finding Electronically Signed   By: Jorje Guild M.D.   On: 10/28/2022 06:05    Pertinent labs & imaging results that were available during my care of the patient were reviewed by me and considered in my medical decision making (see MDM for details).  Medications Ordered in ED Medications  lidocaine (PF) (XYLOCAINE) 1 % injection (has no administration in time range)  Procedures .Marland KitchenLaceration  Repair  Date/Time: 10/28/2022 7:03 AM  Performed by: Teressa Lower, MD Authorized by: Teressa Lower, MD   Laceration details:    Location:  Face   Face location:  Forehead   Length (cm):  1 Skin repair:    Repair method:  Sutures   Suture size:  5-0   Suture material:  Fast-absorbing gut   Suture technique:  Simple interrupted   Number of sutures:  2 Approximation:    Approximation:  Close Repair type:    Repair type:  Simple Post-procedure details:    Dressing:  Open (no dressing)   Procedure completion:  Tolerated well, no immediate complications .Critical Care  Performed by: Teressa Lower, MD Authorized by: Teressa Lower, MD   Critical care provider statement:    Critical care time (minutes):  30   Critical care was necessary to treat or prevent imminent or life-threatening deterioration of the following conditions:  Respiratory failure   Critical care was time spent personally by me on the following activities:  Development of treatment plan with patient or surrogate, discussions with consultants, evaluation of patient's response to treatment, examination of patient, ordering and review of laboratory studies, ordering and review of radiographic studies, ordering and performing treatments and interventions, pulse oximetry, re-evaluation of patient's condition and review of old charts   (including critical care time)  Medical Decision Making / ED Course   This patient presents to the ED for concern of altered mental status, seizure-like behavior, this involves an extensive number of treatment options, and is a complaint that carries with it a high risk of complications and morbidity.  The differential diagnosis includes seizure, sepsis, electrolyte abnormality, hypoglycemia, brain mass  MDM: Seen in the emergency department for evaluation of seizure-like behavior.  Physical exam initially reveals a small 1 cm laceration at the glabella with the patient intermittently  answering questions and somnolent.  Patient arrives tachycardic and mildly hypoxic on room air requiring 2 L.  Laboratory evaluation with leukocytosis to 18.5 with a neutrophilic predominance likely secondary to stress demargination in the setting of his recent seizure.  pH 7.32.  Chest x-ray unremarkable.  Laboratory evaluation currently pending and CT imaging of the head C-spine and chest is pending given patient's trauma and chest compressions given.  Patient then signed out to oncoming provider.  Please see provider signout for continuation of work-up.   Additional history obtained: -Additional history obtained from multiple family members -External records from outside source obtained and reviewed including: Chart review including previous notes, labs, imaging, consultation notes   Lab Tests: -I ordered, reviewed, and interpreted labs.   The pertinent results include:   Labs Reviewed  CBC WITH DIFFERENTIAL/PLATELET - Abnormal; Notable for the following components:      Result Value   WBC 18.5 (*)    Neutro Abs 14.9 (*)    Abs Immature Granulocytes 0.23 (*)    All other components within normal limits  I-STAT VENOUS BLOOD GAS, ED - Abnormal; Notable for the following components:   pCO2, Ven 40.1 (*)    pO2, Ven 70 (*)    Acid-base deficit 5.0 (*)    Potassium 3.2 (*)    Calcium, Ion 1.06 (*)    All other components within normal limits  CULTURE, BLOOD (ROUTINE X 2)  CULTURE, BLOOD (ROUTINE X 2)  COMPREHENSIVE METABOLIC PANEL  URINALYSIS, ROUTINE W REFLEX MICROSCOPIC  BLOOD GAS, VENOUS  RAPID URINE DRUG SCREEN, HOSP PERFORMED  LACTIC ACID, PLASMA  LACTIC ACID,  PLASMA  TROPONIN I (HIGH SENSITIVITY)  TROPONIN I (HIGH SENSITIVITY)      EKG   EKG Interpretation  Date/Time:  Wednesday October 28 2022 05:18:51 EDT Ventricular Rate:  109 PR Interval:  159 QRS Duration: 93 QT Interval:  353 QTC Calculation: 476 R Axis:   11 Text Interpretation: Sinus tachycardia Confirmed  by Maelee Hoot ([id number]) on 10/28/2022 5:31:20 AM         Imaging Studies ordered: I ordered imaging studies including chest x-ray I independently visualized and interpreted imaging. I agree with the radiologist interpretation  CT head, C-spine, chest pending   Medicines ordered and prescription drug management: Meds ordered this encounter  Medications   lidocaine (PF) (XYLOCAINE) 1 % injection    Luecke, Arielle: cabinet override    -I have reviewed the patients home medicines and have made adjustments as needed  Critical interventions Oxygen supplementation    Cardiac Monitoring: The patient was maintained on a cardiac monitor.  I personally viewed and interpreted the cardiac monitored which showed an underlying rhythm of: Sinus tachycardia  Social Determinants of Health:  Factors impacting patients care include: none   Reevaluation: After the interventions noted above, I reevaluated the patient and found that they have :improved  Co morbidities that complicate the patient evaluation  Past Medical History:  Diagnosis Date   Achilles rupture    Acid reflux    Back pain    Carpal tunnel syndrome    Chest pain    Constipation    Dizzy spells    Joint pain    Lactose intolerance    Lipoma    Numbness    legs, hands   Obesity    Plantar fasciitis    Shoulder pain    rotator cuff   SOB (shortness of breath) on exertion       Dispostion: I considered admission for this patient, and disposition pending imaging and laboratory work-up.  Please see provider signout for continuation of work-up.     Final Clinical Impression(s) / ED Diagnoses Final diagnoses:  None     '@PCDICTATION'$ @    Teressa Lower, MD 10/28/22 [phone]

## 2022-10-28 NOTE — ED Notes (Signed)
Walked patient to the bathroom patient did well patient is now back in bed on the monitor with call bell in reach  

## 2022-10-28 NOTE — ED Notes (Signed)
Pt ambulatory to bathroom

## 2022-10-28 NOTE — Procedures (Signed)
Patient Name: Bryan Lee  MRN: [id number]  Epilepsy Attending: Lora Havens  Referring Physician/Provider: Iona Beard, MD Date: 10/28/2022 Duration: 21.29 mins  Patient history: 61 yo male history of hypoventilation presents after new onset grand mal seizure about 4 minutes with prolonged post ictal phase. EEG to evaluate for seizure  Level of alertness: Awake, asleep  AEDs during EEG study: None  Technical aspects: This EEG study was done with scalp electrodes positioned according to the 10-20 International system of electrode placement. Electrical activity was reviewed with band pass filter of 1-'70Hz'$ , sensitivity of 7 uV/mm, display speed of 47m/sec with a '60Hz'$  notched filter applied as appropriate. EEG data were recorded continuously and digitally stored.  Video monitoring was available and reviewed as appropriate.  Description: The posterior dominant rhythm consists of 9 Hz activity of moderate voltage (25-35 uV) seen predominantly in posterior head regions, symmetric and reactive to eye opening and eye closing. Sleep was characterized by vertex waves, sleep spindles (12 to 14 Hz), maximal frontocentral region. At 1549, eeg showed rhythmic low amplitude 13-'14Hz'$  beta activity in  left frontotemporal region which at times appeared sharply contoured. Hyperventilation and photic stimulation were not performed.     IMPRESSION: This study is within normal limits. No seizures or epileptiform discharges were seen throughout the recording.  EEG showed rhythmic sharply contoured beta activity arising from left frontal region at 1529  which could be due to arousal. However, cannot completely exclude ictal activity. Please consider LTM eeg if concern for seizure persists.  Dr HJaquita Foldsand Dr LCheral Markerwere notified.   Lon Klippel OBarbra Sarks

## 2022-10-28 NOTE — ED Provider Notes (Signed)
Sign out from Dr. Matilde Sprang 61 yo male history of hypoventilation presents after new onset grand mal seizure about 4 minutes with prolonged post ictal phase  Physical Exam  BP (!) 101/58   Pulse 100   Resp 18   Ht 1.676 m ('5\' 6"'$ )   Wt 127 kg   SpO2 93%   BMI 45.19 kg/m   Physical Exam  Procedures  Procedures  ED Course / MDM   Clinical Course as of 10/28/22 1551  Wed Oct 28, 2022  0707 CT Head Wo Contrast [DR]  [phone] CT Chest Wo Contrast [DR]  2263 CT head, maxillofacial, cervical spine reviewed without any evidence of acute abnormalities noted [DR]  [phone] Updated patient and family on CT results and that neurology is being consulted [DR]  0925 AST(!): 57 [DR]    Clinical Course User Index [DR] Pattricia Boss, MD   Medical Decision Making Amount and/or Complexity of Data Reviewed Labs: ordered. Decision-making details documented in ED Course. Radiology: ordered. Decision-making details documented in ED Course.  Risk Decision regarding hospitalization.   Patient had versed 5 mg On 2 l n.c No o2 at home Ct pending and reassess Arrived about 2 hours prior to shift change Plan neuro consult Discussed with internal medicine teaching service and they will see for admission        Pattricia Boss, MD 10/28/22 1551

## 2022-10-28 NOTE — ED Triage Notes (Signed)
Pt bib [facility] from home after witnessed fall out of bed. Pt hit head on nightstand. Per wife, pt began having agonal breaths and she began CPR for 4 minutes until fire came. Pt altered and lethargic upon EMS arrival, became combative when transferring to stretcher, given '5mg'$  Versed.   BP 106/50 RR 26 SPO2 99% 4L   HR 110 CBG 133

## 2022-10-28 NOTE — ED Notes (Signed)
Patient transported to MRI 

## 2022-10-28 NOTE — Consult Note (Addendum)
NEURO HOSPITALIST CONSULT NOTE   Requestig physician: Dr. Heber [place]  Reason for Consult: New onset seizure  History obtained from:  Patient, Wife and Chart     HPI:                                                                                                                                          Bryan Lee is an 61 y.o. male with a PMHx of OSA, obesity, carpal tunnel syndrome and other conditions as listed below who presents from home after a first time seizure. He was sleeping in bed with his wife when he rolled out of bed, struck his head and then began exhibiting seizure activity. Prior to rolling out of bed his wife heard sonorous breathing. The seizure involved flailing of his limbs and unresponsiveness with foaming at the mouth lasting for 2-3 minutes, after which the movements stopped but with continued unresponsiveness and loud sonorous breathing. He started to come to about 8 minutes after the seizure stopped. He appeared to be awake at that point but states that he does not remember any interactions with or transport by EMS, with first memory being of him in the hospital. The last thing he remembers is rolling out of bed. He did bite his tongue but there was no incontinence. He has no prior history of seizure-like events.   Past Medical History:  Diagnosis Date   Achilles rupture    Acid reflux    Back pain    Carpal tunnel syndrome    Chest pain    Constipation    Dizzy spells    Joint pain    Lactose intolerance    Lipoma    Numbness    legs, hands   Obesity    Plantar fasciitis    Shoulder pain    rotator cuff   SOB (shortness of breath) on exertion     Past Surgical History:  Procedure Laterality Date   ACHILLES TENDON REPAIR     left   HERNIA REPAIR     lump removal     VASECTOMY      Family History  Problem Relation Age of Onset   Coronary artery disease Father    Heart failure Father    Hypertension Father    Sleep apnea  Father    Heart disease Father    Arthritis Mother    Obesity Mother    Diabetes Unknown        grandmother   Cancer Sister        GYN-type cancer   GER disease Daughter    GER disease Son    Diabetes Maternal Grandmother    Cancer Maternal Grandmother        ovarian   Cancer Paternal Grandmother  lung cancer, + tobacco and colon   Seizures Sister              Social History:  reports that he has never smoked. He has never used smokeless tobacco. He reports current alcohol use. He reports that he does not use drugs.  Allergies  Allergen Reactions   Lactose Intolerance (Gi) Other (See Comments)    Cramping and gas pain    MEDICATIONS:                                                                                                                     No current facility-administered medications on file prior to encounter.   Current Outpatient Medications on File Prior to Encounter  Medication Sig Dispense Refill   atorvastatin (LIPITOR) 40 MG tablet Take 40 mg by mouth daily.     omega-3 acid ethyl esters (LOVAZA) 1 g capsule Take 2 g by mouth 2 (two) times daily.     phentermine 37.5 MG capsule Take 37.5 mg by mouth daily.     topiramate (TOPAMAX) 25 MG tablet Take 25 mg by mouth daily.     valACYclovir (VALTREX) 1000 MG tablet Take 1,000 mg by mouth 2 (two) times daily.     Vitamin D, Ergocalciferol, (DRISDOL) 1.25 MG (50000 UT) CAPS capsule Take 1 capsule (50,000 Units total) by mouth every 7 (seven) days. (Patient taking differently: Take 50,000 Units by mouth every 7 (seven) days. Monday) 4 capsule 1   Insulin Pen Needle (BD PEN NEEDLE NANO 2ND GEN) 32G X 4 MM MISC 1 Package by Does not apply route 2 (two) times daily. 100 each 0   TURMERIC PO Take 1 capsule by mouth daily. Mix contents with drink of choice       ROS:                                                                                                                                       As per HPI. Does  not endorse any additional symptoms.    Blood pressure 127/70, pulse 92, temperature 98 F (36.7 C), resp. rate 13, height '5\' 6"'$  (1.676 m), weight 127 kg, SpO2 94 %.   General Examination:  Physical Exam  HEENT-  Bruising/abrasions with blood to face and forehead. .   Lungs- Respirations unlabored Extremities- Warm and well perfused   Neurological Examination Mental Status: Awake and alert. Oriented x 5. Speech fluent with intact naming and comprehension. Complex reasoning intact. Normal affect.  Cranial Nerves: II: Visual fields intact bilaterally. No extinction to DSS.   III,IV, VI: No ptosis. EOMI. No nystagmus.  V: Temp sensation equal bilaterally VII: Smile symmetric VIII: Hearing intact to conversation IX,X: No hoarseness or hypophonia XI: Symmetric XII: Midline tongue extension Motor: Right : Upper extremity   5/5    Left:     Upper extremity   5/5  Lower extremity   5/5     Lower extremity   5/5 No pronator drift Sensory: Temp and light touch intact throughout, bilaterally Deep Tendon Reflexes: 2+ and symmetric throughout Cerebellar: No ataxia with FNF bilaterally Gait: Deferred   Lab Results: Basic Metabolic Panel: Recent Labs  Lab 10/28/22 0525 10/28/22 0535 10/28/22 1418  NA 133* 135  --   K 3.2* 3.2*  --   CL 93*  --   --   CO2 17*  --   --   GLUCOSE 225*  --   --   BUN 16  --   --   CREATININE 1.29*  --   --   CALCIUM 8.8*  --   --   MG  --   --  2.4    CBC: Recent Labs  Lab 10/28/22 0525 10/28/22 0535  WBC 18.5*  --   NEUTROABS 14.9*  --   HGB 15.4 16.3  HCT 46.4 48.0  MCV 86.7  --   PLT 312  --     Cardiac Enzymes: No results for input(s): "CKTOTAL", "CKMB", "CKMBINDEX", "TROPONINI" in the last 168 hours.  Lipid Panel: No results for input(s): "CHOL", "TRIG", "HDL", "CHOLHDL", "VLDL", "LDLCALC" in the last 168 hours.  Imaging: MR  BRAIN WO CONTRAST  Result Date: 10/28/2022 CLINICAL DATA:  Provided history: Seizure, generalized, normal neuro exam. Additional history provided: Altered mental status, seizure-like activity. EXAM: MRI HEAD WITHOUT CONTRAST TECHNIQUE: Multiplanar, multiecho pulse sequences of the brain and surrounding structures were obtained without intravenous contrast. COMPARISON:  Head CT 10/28/2022. FINDINGS: Intermittently motion degraded examination (with up to moderate motion degradation of the acquired sequences). Brain: Cerebral volume is normal. Mild multifocal T2 FLAIR hyperintense signal abnormality within the cerebral white matter, nonspecific but most often secondary to chronic small vessel ischemia. No cortical encephalomalacia is identified. No evidence of mesial temporal sclerosis. There is no acute infarct. No evidence of an intracranial mass. No chronic intracranial blood products. No extra-axial fluid collection. No midline shift. Vascular: Maintained flow voids within the proximal large arterial vessels. Skull and upper cervical spine: No focal suspicious marrow lesion. Sinuses/Orbits: Age-indeterminate right orbital floor fracture. 1.9 cm mucous retention cyst within the left maxillary sinus. Mild mucosal thickening, and small mucous retention cyst, within the left sphenoid sinus. Small fluid level within the right sphenoid sinus. IMPRESSION: 1. No evidence of acute intracranial abnormality. 2. No specific seizure focus is identified. 3. Mild multifocal T2 FLAIR hyperintense signal abnormality within the cerebral white matter, nonspecific but most often secondary to chronic small vessel ischemia. 4. Age-indeterminate right orbital floor fracture. 5. Paranasal sinus disease, as described. Electronically Signed   By: Kellie Simmering D.O.   On: 10/28/2022 13:54   CT Head Wo Contrast  Result Date: 10/28/2022 CLINICAL DATA:  Fall.  Delirium. EXAM: CT HEAD  WITHOUT CONTRAST CT MAXILLOFACIAL WITHOUT CONTRAST CT  CERVICAL SPINE WITHOUT CONTRAST TECHNIQUE: Multidetector CT imaging of the head, cervical spine, and maxillofacial structures were performed using the standard protocol without intravenous contrast. Multiplanar CT image reconstructions of the cervical spine and maxillofacial structures were also generated. RADIATION DOSE REDUCTION: This exam was performed according to the departmental dose-optimization program which includes automated exposure control, adjustment of the mA and/or kV according to patient size and/or use of iterative reconstruction technique. COMPARISON:  None Available. FINDINGS: CT HEAD FINDINGS Brain: No evidence of acute infarction, hemorrhage, hydrocephalus, extra-axial collection or mass lesion/mass effect. Vascular: No hyperdense vessel or unexpected calcification. Skull: Normal. Negative for fracture or focal lesion. Other: Signs of frontal scalp contusion noted. CT MAXILLOFACIAL FINDINGS Osseous: Age-indeterminate right floor of orbit fracture identified, image 43/12 and image 27/11. The remaining osseous structures appear intact. Orbits: Negative. No traumatic or inflammatory finding. Sinuses: Mucous retention cyst versus polyp noted in the superior aspect of the left maxillary sinus measuring 1.8 cm. No air-fluid levels identified within the sinuses. Soft tissues: Negative. CT CERVICAL SPINE FINDINGS Alignment: Mild reversal of normal cervical lordosis may reflect patient positioning or muscle spasm. No sign of acute posttraumatic malalignment of the cervical spine. Skull base and vertebrae: No acute fracture. No primary bone lesion or focal pathologic process. Soft tissues and spinal canal: No prevertebral fluid or swelling. No visible canal hematoma. Disc levels: Disc spaces are well preserved. No significant degenerative change. Upper chest: Negative. Other: None IMPRESSION: 1. No acute intracranial abnormalities. 2. Age-indeterminate right floor of orbit fracture. 3. No evidence for  cervical spine fracture or subluxation. 4. Frontal scalp contusion. Electronically Signed   By: Kerby Moors M.D.   On: 10/28/2022 08:08   CT Maxillofacial Wo Contrast  Result Date: 10/28/2022 CLINICAL DATA:  Fall.  Delirium. EXAM: CT HEAD WITHOUT CONTRAST CT MAXILLOFACIAL WITHOUT CONTRAST CT CERVICAL SPINE WITHOUT CONTRAST TECHNIQUE: Multidetector CT imaging of the head, cervical spine, and maxillofacial structures were performed using the standard protocol without intravenous contrast. Multiplanar CT image reconstructions of the cervical spine and maxillofacial structures were also generated. RADIATION DOSE REDUCTION: This exam was performed according to the departmental dose-optimization program which includes automated exposure control, adjustment of the mA and/or kV according to patient size and/or use of iterative reconstruction technique. COMPARISON:  None Available. FINDINGS: CT HEAD FINDINGS Brain: No evidence of acute infarction, hemorrhage, hydrocephalus, extra-axial collection or mass lesion/mass effect. Vascular: No hyperdense vessel or unexpected calcification. Skull: Normal. Negative for fracture or focal lesion. Other: Signs of frontal scalp contusion noted. CT MAXILLOFACIAL FINDINGS Osseous: Age-indeterminate right floor of orbit fracture identified, image 43/12 and image 27/11. The remaining osseous structures appear intact. Orbits: Negative. No traumatic or inflammatory finding. Sinuses: Mucous retention cyst versus polyp noted in the superior aspect of the left maxillary sinus measuring 1.8 cm. No air-fluid levels identified within the sinuses. Soft tissues: Negative. CT CERVICAL SPINE FINDINGS Alignment: Mild reversal of normal cervical lordosis may reflect patient positioning or muscle spasm. No sign of acute posttraumatic malalignment of the cervical spine. Skull base and vertebrae: No acute fracture. No primary bone lesion or focal pathologic process. Soft tissues and spinal canal: No  prevertebral fluid or swelling. No visible canal hematoma. Disc levels: Disc spaces are well preserved. No significant degenerative change. Upper chest: Negative. Other: None IMPRESSION: 1. No acute intracranial abnormalities. 2. Age-indeterminate right floor of orbit fracture. 3. No evidence for cervical spine fracture or subluxation. 4. Frontal scalp contusion. Electronically Signed  By: Kerby Moors M.D.   On: 10/28/2022 08:08   CT Cervical Spine Wo Contrast  Result Date: 10/28/2022 CLINICAL DATA:  Fall.  Delirium. EXAM: CT HEAD WITHOUT CONTRAST CT MAXILLOFACIAL WITHOUT CONTRAST CT CERVICAL SPINE WITHOUT CONTRAST TECHNIQUE: Multidetector CT imaging of the head, cervical spine, and maxillofacial structures were performed using the standard protocol without intravenous contrast. Multiplanar CT image reconstructions of the cervical spine and maxillofacial structures were also generated. RADIATION DOSE REDUCTION: This exam was performed according to the departmental dose-optimization program which includes automated exposure control, adjustment of the mA and/or kV according to patient size and/or use of iterative reconstruction technique. COMPARISON:  None Available. FINDINGS: CT HEAD FINDINGS Brain: No evidence of acute infarction, hemorrhage, hydrocephalus, extra-axial collection or mass lesion/mass effect. Vascular: No hyperdense vessel or unexpected calcification. Skull: Normal. Negative for fracture or focal lesion. Other: Signs of frontal scalp contusion noted. CT MAXILLOFACIAL FINDINGS Osseous: Age-indeterminate right floor of orbit fracture identified, image 43/12 and image 27/11. The remaining osseous structures appear intact. Orbits: Negative. No traumatic or inflammatory finding. Sinuses: Mucous retention cyst versus polyp noted in the superior aspect of the left maxillary sinus measuring 1.8 cm. No air-fluid levels identified within the sinuses. Soft tissues: Negative. CT CERVICAL SPINE FINDINGS  Alignment: Mild reversal of normal cervical lordosis may reflect patient positioning or muscle spasm. No sign of acute posttraumatic malalignment of the cervical spine. Skull base and vertebrae: No acute fracture. No primary bone lesion or focal pathologic process. Soft tissues and spinal canal: No prevertebral fluid or swelling. No visible canal hematoma. Disc levels: Disc spaces are well preserved. No significant degenerative change. Upper chest: Negative. Other: None IMPRESSION: 1. No acute intracranial abnormalities. 2. Age-indeterminate right floor of orbit fracture. 3. No evidence for cervical spine fracture or subluxation. 4. Frontal scalp contusion. Electronically Signed   By: Kerby Moors M.D.   On: 10/28/2022 08:08   CT Chest Wo Contrast  Result Date: 10/28/2022 CLINICAL DATA:  Evaluate for rib fracture. EXAM: CT CHEST WITHOUT CONTRAST TECHNIQUE: Multidetector CT imaging of the chest was performed following the standard protocol without IV contrast. RADIATION DOSE REDUCTION: This exam was performed according to the departmental dose-optimization program which includes automated exposure control, adjustment of the mA and/or kV according to patient size and/or use of iterative reconstruction technique. COMPARISON:  CT AP 02/07/2021. FINDINGS: Cardiovascular: Normal heart size. No pericardial effusion. Mild aortic atherosclerotic calcification. Mediastinum/Nodes: No enlarged axillary or mediastinal lymph nodes. Thyroid gland, and trachea are unremarkable. Patulous esophagus noted which may reflect chronic dysmotility. Lungs/Pleura: Unchanged chronic asymmetric elevation of the right hemidiaphragm. Mild subsegmental atelectasis is identified within the right middle lobe and both lung bases. No pleural effusion, airspace consolidation, or signs of pneumothorax Upper Abdomen: Diffuse hepatic steatosis. Cyst within the right lobe of liver is again measuring 1.5 cm, image 114/4. Musculoskeletal: No chest  wall mass or suspicious bone lesions identified. No rib fractures identified. IMPRESSION: 1. No rib fractures identified. 2. Unchanged chronic asymmetric elevation of the right hemidiaphragm. 3. Hepatic steatosis. 4. 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 5.  Aortic Atherosclerosis (ICD10-I70.0). Electronically Signed   By: Kerby Moors M.D.   On: 10/28/2022 07:52   DG Chest Portable 1 View  Result Date: 10/28/2022 CLINICAL DATA:  Fall, rule out fracture EXAM: PORTABLE CHEST 1 VIEW COMPARISON:  06/20/2011 FINDINGS: Limited low volume chest with extensive artifact from EKG leads. Prominent heart size accentuated by technique. Indistinct density at the bases favoring atelectasis. No visible  effusion or pneumothorax. No gross fracture. IMPRESSION: Limited low volume chest with presumed atelectasis. No focal finding Electronically Signed   By: Jorje Guild M.D.   On: 10/28/2022 06:05     Assessment: 61 year old male presenting after seizure activity at home beginning during sleep. No prior history of seizures.  - The patient is back to baseline. Neurological exam is normal.  - CT head:  1. No acute intracranial abnormalities. 2. Age-indeterminate right floor of orbit fracture. 3. No evidence for cervical spine fracture or subluxation. 4. Frontal scalp contusion. - MRI brain: 1. No evidence of acute intracranial abnormality. 2. No specific seizure focus is identified. 3. Mild multifocal T2 FLAIR hyperintense signal abnormality within the cerebral white matter, nonspecific but most often secondary to chronic small vessel ischemia. 4. Age-indeterminate right orbital floor fracture. 5. Paranasal sinus disease, as described - Lactate > 9. Na 133 on initial labs with K 3.2. Glucose 225. Cr 1.29. Ca 8.8 in the setting of low albumin. Mild elevations of AST and ALT. Mg 2.4. WBC 18.5. UDS positive for benzos only.   Recommendations: - EEG - Inpatient seizure precautions - Keppra 2000 mg IV  x 1, then 500 mg po BID (ordered)  Addendum: Preliminary read of EEG by Dr. Hortense Ramal reveals rhythmic pattern in the left frontal region, but not definite enough to be defined as an electrographic seizure   Electronically signed: Dr. Kerney Elbe 10/28/2022, 5:51 PM

## 2022-10-28 NOTE — ED Notes (Signed)
Neurology at bedside.

## 2022-10-29 DIAGNOSIS — R7989 Other specified abnormal findings of blood chemistry: Secondary | ICD-10-CM | POA: Diagnosis not present

## 2022-10-29 DIAGNOSIS — R0789 Other chest pain: Secondary | ICD-10-CM | POA: Diagnosis not present

## 2022-10-29 DIAGNOSIS — R569 Unspecified convulsions: Secondary | ICD-10-CM | POA: Diagnosis not present

## 2022-10-29 LAB — BLOOD CULTURE ID PANEL (REFLEXED) - BCID2

## 2022-10-29 LAB — BASIC METABOLIC PANEL
Anion gap: 12 (ref 5–15)
BUN: 8 mg/dL (ref 6–20)
CO2: 31 mmol/L (ref 22–32)
Calcium: 9.1 mg/dL (ref 8.9–10.3)
Chloride: 98 mmol/L (ref 98–111)
Creatinine, Ser: 0.99 mg/dL (ref 0.61–1.24)
GFR, Estimated: >60 mL/min (ref >60)
Glucose, Bld: 114 mg/dL — ABNORMAL HIGH (ref 70–99)
Potassium: 3.6 mmol/L (ref 3.5–5.1)
Sodium: 141 mmol/L (ref 135–145)

## 2022-10-29 LAB — CBC
HCT: 46.5 % (ref 39.0–52.0)
Hemoglobin: 15.6 g/dL (ref 13.0–17.0)
MCH: 28.8 pg (ref 26.0–34.0)
MCHC: 33.5 g/dL (ref 30.0–36.0)
MCV: 86 fL (ref 80.0–100.0)
Platelets: 281 10*3/uL (ref 150–400)
RBC: 5.41 MIL/uL (ref 4.22–5.81)
RDW: 14 % (ref 11.5–15.5)
WBC: 12.5 10*3/uL — ABNORMAL HIGH (ref 4.0–10.5)
nRBC: 0 % (ref 0.0–0.2)

## 2022-10-29 MED ORDER — LEVETIRACETAM 500 MG PO TABS: 500.0000 mg | ORAL_TABLET | Freq: Two times a day (BID) | ORAL | 0 refills | Status: DC

## 2022-10-29 MED ORDER — GUAIFENESIN-DM 100-10 MG/5ML PO SYRP
5.0000 mL | ORAL_SOLUTION | ORAL | Status: DC | PRN
  Administered 2022-10-29: 5 mL via ORAL
  Filled 2022-10-29: qty 5

## 2022-10-29 NOTE — Discharge Summary (Addendum)
Name: Bryan Lee MRN: [id number] DOB: Nov 03, 1961 61 y.o. PCP: Midge Minium, MD  Date of Admission: 10/28/2022  5:09 AM Date of Discharge: 10/29/2022 Attending Physician: Dr. Angelia Mould  Discharge Diagnosis: Seizure-like activity/syncope with convulsions Chest wall pain Hypokalemia AKI URI OSA HLD History of shingles  Discharge Medications: Allergies as of 10/29/2022       Reactions   Lactose Intolerance (gi) Other (See Comments)   Cramping and gas pain        Medication List     STOP taking these medications    phentermine 37.5 MG capsule   topiramate 25 MG tablet Commonly known as: TOPAMAX       TAKE these medications    atorvastatin 40 MG tablet Commonly known as: LIPITOR Take 40 mg by mouth daily.   Insulin Pen Needle 32G X 4 MM Misc Commonly known as: BD Pen Needle Nano 2nd Gen 1 Package by Does not apply route 2 (two) times daily.   levETIRAcetam 500 MG tablet Commonly known as: KEPPRA Take 1 tablet (500 mg total) by mouth 2 (two) times daily.   omega-3 acid ethyl esters 1 g capsule Commonly known as: LOVAZA Take 2 g by mouth 2 (two) times daily.   TURMERIC PO Take 1 capsule by mouth daily. Mix contents with drink of choice   valACYclovir 1000 MG tablet Commonly known as: VALTREX Take 1,000 mg by mouth 2 (two) times daily.   Vitamin D (Ergocalciferol) 1.25 MG (50000 UNIT) Caps capsule Commonly known as: DRISDOL Take 1 capsule (50,000 Units total) by mouth every 7 (seven) days. What changed: additional instructions               Discharge Care Instructions  (From admission, onward)           Start     Ordered   10/29/22 0000  No dressing needed        10/29/22 1506            Disposition and follow-up:   Bryan Lee was discharged from [facility] in Good condition.  At the hospital follow up visit please address:  1.  Follow-up:  a.  Monitor for any further seizure-like  activity and for any issues with Keppra.    b.  Monitor for resolution of this chest wall pain.   c.  Determine whether to restart topiramate and phentermine  2.  Labs / imaging needed at time of follow-up: None  3.  Pending labs/ test needing follow-up: None  4.  Medication Changes Keppra 500 mg twice daily  Follow-up Appointments:  Follow-up Information     Midge Minium, MD. Schedule an appointment as soon as possible for a visit.   Specialty: Family Medicine Contact information: [name] [place] [address] [phone]         [facility]. Schedule an appointment as soon as possible for a visit.   Contact information: [address]     [address] [place] [address] [phone]                Hospital Course by problem list: Bryan Lee is a 61 year old male with PMH of HLD, right diaphragmatic palsy, OSA, class III obesity, and shingles who presented after falling out of bed with reported convulsions and is admitted for seizure work-up.  Seizure-like activity Syncope with convulsions Patient presented after falling out of his bed and striking his head with  subsequent convulsions with rhythmic movement of his upper extremities.  Lasted 2 to 3 minutes.  No bowel or bladder incontinence.  He did have a prolonged postictal state which was improved with lorazepam.  No prior history of seizures in the past.  Now back at baseline.  No obvious cause of his seizure.  Did take Robitussin-DM as well as an over-the-counter sleep aid which may have made him more difficult to arouse.  No alcohol use or illicit drug use per patient. Lactic acid noted to be 9 improved to 2.  UA consistent with myoglobinuria. CT scan of the head/maxillofacial/C-spine and MRI of the head without acute abnormalities.  Neurology was consulted.  EEG showed rhythmic sharply contoured beta activity arising from the left frontal region which  could be due to arousal but ictal activity cannot be excluded.  Patient quickly returned to baseline after arriving to the emergency department and receiving another dose of lorazepam and stayed baseline after this.  Patient was started on Keppra and remained at baseline.  He will follow-up with his primary care and neurology and will remain on Keppra.  Phentermine and topiramate held on discharge until decision of follow-up.  Seizure precautions including driving discussed.   Chest wall pain Elevated Troponin Patient got chest compressions during his downtime described above. Patient reported a dull aching sensation that is constant but minimal currently. Troponin noted to be elevated with a peak of 82.  EKG with tachycardia which quickly resolved.  No signs of ischemic changes.  Pain progressively improved with Tylenol.   Hypokalemia Potassium of 3.2 and Mag is 2.4.  Patient with a known history of hypokalemia and takes potassium at home.  This was repleted with oral potassium and normalized quickly.   AKI Serum creatinine of 1.29 on admission likely secondary to decreased renal perfusion during convulsions.  Creatinine 0.99 following morning.  No decreased urine output or other urinary symptoms.   URI Patient reported cough and nasal drainage for the past 1.5 weeks.  Felt that this was improving.  Home COVID test was negative.  Leukocytosis of 18.5 which decreased to 12.  He remained afebrile and leukocytosis likely secondary to convulsions.    OSA He wears a CPAP at home and we continued with CPAP at night during his admission.   Hyperlipidemia We continued his home statin during admission.   History of shingles Shingles outbreak 1 month ago was taking valacyclovir which is now completed.  Does have some hyperpigmentation of the left shoulder.  No vesicular lesions    Discharge Subjective: Patient is doing well this morning without any new or worsening complaints.  He says that his  headache has resolved besides from the pain where he hit his head which is mostly painful whenever he presses on it.  He also states that his chest pain is improving.  Him and his wife believe he is still at baseline.  Discharge Exam:   BP 116/76 (BP Location: Left Arm)   Pulse 80   Temp 98.6 F (37 C) (Oral)   Resp 16   Ht '5\' 6"'$  (1.676 m)   Wt 127 kg   SpO2 97%   BMI 45.19 kg/m  Constitutional: well-appearing obese male sitting in bed, in no acute distress HENT: normocephalic 2 cm clean and stitched laceration on the glabella Eyes: conjunctiva non-erythematous Neck: supple Cardiovascular: regular rate and rhythm Pulmonary/Chest: normal work of breathing on room air, lungs clear to auscultation bilaterally Abdominal: soft, non-tender, non-distended MSK: normal bulk and  tone Neurological: alert & oriented x 3, grossly intact strength in bilateral upper and lower extremities, normal gait  Pertinent Labs, Studies, and Procedures:     Latest Ref Rng & Units 10/29/2022    4:04 AM 10/28/2022    5:35 AM 10/28/2022    5:25 AM  CBC  WBC 4.0 - 10.5 K/uL 12.5   18.5   Hemoglobin 13.0 - 17.0 g/dL 15.6  16.3  15.4   Hematocrit 39.0 - 52.0 % 46.5  48.0  46.4   Platelets 150 - 400 K/uL 281   312        Latest Ref Rng & Units 10/29/2022    4:04 AM 10/28/2022    5:35 AM 10/28/2022    5:25 AM  CMP  Glucose 70 - 99 mg/dL 114   225   BUN 6 - 20 mg/dL 8   16   Creatinine 0.61 - 1.24 mg/dL 0.99   1.29   Sodium 135 - 145 mmol/L 141  135  133   Potassium 3.5 - 5.1 mmol/L 3.6  3.2  3.2   Chloride 98 - 111 mmol/L 98   93   CO2 22 - 32 mmol/L 31   17   Calcium 8.9 - 10.3 mg/dL 9.1   8.8   Total Protein 6.5 - 8.1 g/dL   7.8   Total Bilirubin 0.3 - 1.2 mg/dL   0.7   Alkaline Phos 38 - 126 U/L   53   AST 15 - 41 U/L   57   ALT 0 - 44 U/L   53     MR BRAIN WO CONTRAST  Result Date: 10/28/2022 CLINICAL DATA:  Provided history: Seizure, generalized, normal neuro exam. Additional history provided:  Altered mental status, seizure-like activity. EXAM: MRI HEAD WITHOUT CONTRAST TECHNIQUE: Multiplanar, multiecho pulse sequences of the brain and surrounding structures were obtained without intravenous contrast. COMPARISON:  Head CT 10/28/2022. FINDINGS: Intermittently motion degraded examination (with up to moderate motion degradation of the acquired sequences). Brain: Cerebral volume is normal. Mild multifocal T2 FLAIR hyperintense signal abnormality within the cerebral white matter, nonspecific but most often secondary to chronic small vessel ischemia. No cortical encephalomalacia is identified. No evidence of mesial temporal sclerosis. There is no acute infarct. No evidence of an intracranial mass. No chronic intracranial blood products. No extra-axial fluid collection. No midline shift. Vascular: Maintained flow voids within the proximal large arterial vessels. Skull and upper cervical spine: No focal suspicious marrow lesion. Sinuses/Orbits: Age-indeterminate right orbital floor fracture. 1.9 cm mucous retention cyst within the left maxillary sinus. Mild mucosal thickening, and small mucous retention cyst, within the left sphenoid sinus. Small fluid level within the right sphenoid sinus. IMPRESSION: 1. No evidence of acute intracranial abnormality. 2. No specific seizure focus is identified. 3. Mild multifocal T2 FLAIR hyperintense signal abnormality within the cerebral white matter, nonspecific but most often secondary to chronic small vessel ischemia. 4. Age-indeterminate right orbital floor fracture. 5. Paranasal sinus disease, as described. Electronically Signed   By: Kellie Simmering D.O.   On: 10/28/2022 13:54   EEG adult  Result Date: 10/28/2022 Lora Havens, MD     10/28/2022  9:38 PM Patient Name: Bryan Lee MRN: [id number] Epilepsy Attending: Lora Havens Referring Physician/Provider: Iona Beard, MD Date: 10/28/2022 Duration: 21.29 mins Patient history: 61 yo male history of  hypoventilation presents after new onset grand mal seizure about 4 minutes with prolonged post ictal phase. EEG to evaluate for seizure Level of alertness:  Awake, asleep AEDs during EEG study: None Technical aspects: This EEG study was done with scalp electrodes positioned according to the 10-20 International system of electrode placement. Electrical activity was reviewed with band pass filter of 1-'70Hz'$ , sensitivity of 7 uV/mm, display speed of 80m/sec with a '60Hz'$  notched filter applied as appropriate. EEG data were recorded continuously and digitally stored.  Video monitoring was available and reviewed as appropriate. Description: The posterior dominant rhythm consists of 9 Hz activity of moderate voltage (25-35 uV) seen predominantly in posterior head regions, symmetric and reactive to eye opening and eye closing. Sleep was characterized by vertex waves, sleep spindles (12 to 14 Hz), maximal frontocentral region. At 1549, eeg showed rhythmic low amplitude 13-'14Hz'$  beta activity in  left frontotemporal region which at times appeared sharply contoured. Hyperventilation and photic stimulation were not performed.   IMPRESSION: This study is within normal limits. No seizures or epileptiform discharges were seen throughout the recording. EEG showed rhythmic sharply contoured beta activity arising from left frontal region at 1529  which could be due to arousal. However, cannot completely exclude ictal activity. Please consider LTM eeg if concern for seizure persists. Dr HJaquita Foldsand Dr LCheral Markerwere notified. PLora Havens  CT Head Wo Contrast  Result Date: 10/28/2022 CLINICAL DATA:  Fall.  Delirium. EXAM: CT HEAD WITHOUT CONTRAST CT MAXILLOFACIAL WITHOUT CONTRAST CT CERVICAL SPINE WITHOUT CONTRAST TECHNIQUE: Multidetector CT imaging of the head, cervical spine, and maxillofacial structures were performed using the standard protocol without intravenous contrast. Multiplanar CT image reconstructions of the cervical  spine and maxillofacial structures were also generated. RADIATION DOSE REDUCTION: This exam was performed according to the departmental dose-optimization program which includes automated exposure control, adjustment of the mA and/or kV according to patient size and/or use of iterative reconstruction technique. COMPARISON:  None Available. FINDINGS: CT HEAD FINDINGS Brain: No evidence of acute infarction, hemorrhage, hydrocephalus, extra-axial collection or mass lesion/mass effect. Vascular: No hyperdense vessel or unexpected calcification. Skull: Normal. Negative for fracture or focal lesion. Other: Signs of frontal scalp contusion noted. CT MAXILLOFACIAL FINDINGS Osseous: Age-indeterminate right floor of orbit fracture identified, image 43/12 and image 27/11. The remaining osseous structures appear intact. Orbits: Negative. No traumatic or inflammatory finding. Sinuses: Mucous retention cyst versus polyp noted in the superior aspect of the left maxillary sinus measuring 1.8 cm. No air-fluid levels identified within the sinuses. Soft tissues: Negative. CT CERVICAL SPINE FINDINGS Alignment: Mild reversal of normal cervical lordosis may reflect patient positioning or muscle spasm. No sign of acute posttraumatic malalignment of the cervical spine. Skull base and vertebrae: No acute fracture. No primary bone lesion or focal pathologic process. Soft tissues and spinal canal: No prevertebral fluid or swelling. No visible canal hematoma. Disc levels: Disc spaces are well preserved. No significant degenerative change. Upper chest: Negative. Other: None IMPRESSION: 1. No acute intracranial abnormalities. 2. Age-indeterminate right floor of orbit fracture. 3. No evidence for cervical spine fracture or subluxation. 4. Frontal scalp contusion. Electronically Signed   By: TKerby MoorsM.D.   On: 10/28/2022 08:08   CT Maxillofacial Wo Contrast  Result Date: 10/28/2022 CLINICAL DATA:  Fall.  Delirium. EXAM: CT HEAD WITHOUT  CONTRAST CT MAXILLOFACIAL WITHOUT CONTRAST CT CERVICAL SPINE WITHOUT CONTRAST TECHNIQUE: Multidetector CT imaging of the head, cervical spine, and maxillofacial structures were performed using the standard protocol without intravenous contrast. Multiplanar CT image reconstructions of the cervical spine and maxillofacial structures were also generated. RADIATION DOSE REDUCTION: This exam was performed according to the departmental  dose-optimization program which includes automated exposure control, adjustment of the mA and/or kV according to patient size and/or use of iterative reconstruction technique. COMPARISON:  None Available. FINDINGS: CT HEAD FINDINGS Brain: No evidence of acute infarction, hemorrhage, hydrocephalus, extra-axial collection or mass lesion/mass effect. Vascular: No hyperdense vessel or unexpected calcification. Skull: Normal. Negative for fracture or focal lesion. Other: Signs of frontal scalp contusion noted. CT MAXILLOFACIAL FINDINGS Osseous: Age-indeterminate right floor of orbit fracture identified, image 43/12 and image 27/11. The remaining osseous structures appear intact. Orbits: Negative. No traumatic or inflammatory finding. Sinuses: Mucous retention cyst versus polyp noted in the superior aspect of the left maxillary sinus measuring 1.8 cm. No air-fluid levels identified within the sinuses. Soft tissues: Negative. CT CERVICAL SPINE FINDINGS Alignment: Mild reversal of normal cervical lordosis may reflect patient positioning or muscle spasm. No sign of acute posttraumatic malalignment of the cervical spine. Skull base and vertebrae: No acute fracture. No primary bone lesion or focal pathologic process. Soft tissues and spinal canal: No prevertebral fluid or swelling. No visible canal hematoma. Disc levels: Disc spaces are well preserved. No significant degenerative change. Upper chest: Negative. Other: None IMPRESSION: 1. No acute intracranial abnormalities. 2. Age-indeterminate right  floor of orbit fracture. 3. No evidence for cervical spine fracture or subluxation. 4. Frontal scalp contusion. Electronically Signed   By: Kerby Moors M.D.   On: 10/28/2022 08:08   CT Cervical Spine Wo Contrast  Result Date: 10/28/2022 CLINICAL DATA:  Fall.  Delirium. EXAM: CT HEAD WITHOUT CONTRAST CT MAXILLOFACIAL WITHOUT CONTRAST CT CERVICAL SPINE WITHOUT CONTRAST TECHNIQUE: Multidetector CT imaging of the head, cervical spine, and maxillofacial structures were performed using the standard protocol without intravenous contrast. Multiplanar CT image reconstructions of the cervical spine and maxillofacial structures were also generated. RADIATION DOSE REDUCTION: This exam was performed according to the departmental dose-optimization program which includes automated exposure control, adjustment of the mA and/or kV according to patient size and/or use of iterative reconstruction technique. COMPARISON:  None Available. FINDINGS: CT HEAD FINDINGS Brain: No evidence of acute infarction, hemorrhage, hydrocephalus, extra-axial collection or mass lesion/mass effect. Vascular: No hyperdense vessel or unexpected calcification. Skull: Normal. Negative for fracture or focal lesion. Other: Signs of frontal scalp contusion noted. CT MAXILLOFACIAL FINDINGS Osseous: Age-indeterminate right floor of orbit fracture identified, image 43/12 and image 27/11. The remaining osseous structures appear intact. Orbits: Negative. No traumatic or inflammatory finding. Sinuses: Mucous retention cyst versus polyp noted in the superior aspect of the left maxillary sinus measuring 1.8 cm. No air-fluid levels identified within the sinuses. Soft tissues: Negative. CT CERVICAL SPINE FINDINGS Alignment: Mild reversal of normal cervical lordosis may reflect patient positioning or muscle spasm. No sign of acute posttraumatic malalignment of the cervical spine. Skull base and vertebrae: No acute fracture. No primary bone lesion or focal pathologic  process. Soft tissues and spinal canal: No prevertebral fluid or swelling. No visible canal hematoma. Disc levels: Disc spaces are well preserved. No significant degenerative change. Upper chest: Negative. Other: None IMPRESSION: 1. No acute intracranial abnormalities. 2. Age-indeterminate right floor of orbit fracture. 3. No evidence for cervical spine fracture or subluxation. 4. Frontal scalp contusion. Electronically Signed   By: Kerby Moors M.D.   On: 10/28/2022 08:08   CT Chest Wo Contrast  Result Date: 10/28/2022 CLINICAL DATA:  Evaluate for rib fracture. EXAM: CT CHEST WITHOUT CONTRAST TECHNIQUE: Multidetector CT imaging of the chest was performed following the standard protocol without IV contrast. RADIATION DOSE REDUCTION: This exam was performed  according to the departmental dose-optimization program which includes automated exposure control, adjustment of the mA and/or kV according to patient size and/or use of iterative reconstruction technique. COMPARISON:  CT AP 02/07/2021. FINDINGS: Cardiovascular: Normal heart size. No pericardial effusion. Mild aortic atherosclerotic calcification. Mediastinum/Nodes: No enlarged axillary or mediastinal lymph nodes. Thyroid gland, and trachea are unremarkable. Patulous esophagus noted which may reflect chronic dysmotility. Lungs/Pleura: Unchanged chronic asymmetric elevation of the right hemidiaphragm. Mild subsegmental atelectasis is identified within the right middle lobe and both lung bases. No pleural effusion, airspace consolidation, or signs of pneumothorax Upper Abdomen: Diffuse hepatic steatosis. Cyst within the right lobe of liver is again measuring 1.5 cm, image 114/4. Musculoskeletal: No chest wall mass or suspicious bone lesions identified. No rib fractures identified. IMPRESSION: 1. No rib fractures identified. 2. Unchanged chronic asymmetric elevation of the right hemidiaphragm. 3. Hepatic steatosis. 4. 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 5.  Aortic Atherosclerosis (ICD10-I70.0). Electronically Signed   By: Kerby Moors M.D.   On: 10/28/2022 07:52   DG Chest Portable 1 View  Result Date: 10/28/2022 CLINICAL DATA:  Fall, rule out fracture EXAM: PORTABLE CHEST 1 VIEW COMPARISON:  06/20/2011 FINDINGS: Limited low volume chest with extensive artifact from EKG leads. Prominent heart size accentuated by technique. Indistinct density at the bases favoring atelectasis. No visible effusion or pneumothorax. No gross fracture. IMPRESSION: Limited low volume chest with presumed atelectasis. No focal finding Electronically Signed   By: Jorje Guild M.D.   On: 10/28/2022 06:05     Discharge Instructions: Discharge Instructions     Call MD for:  difficulty breathing, headache or visual disturbances   Complete by: As directed    Call MD for:  extreme fatigue   Complete by: As directed    Call MD for:  persistant dizziness or light-headedness   Complete by: As directed    Call MD for:  persistant nausea and vomiting   Complete by: As directed    Call MD for:  redness, tenderness, or signs of infection (pain, swelling, redness, odor or green/yellow discharge around incision site)   Complete by: As directed    Call MD for:  severe uncontrolled pain   Complete by: As directed    Call MD for:  temperature >100.4   Complete by: As directed    Diet - low sodium heart healthy   Complete by: As directed    Increase activity slowly   Complete by: As directed    No dressing needed   Complete by: As directed        Signed: Johny Blamer, DO 10/29/2022, 3:56 PM   Pager: [phone]

## 2022-10-29 NOTE — Discharge Instructions (Addendum)
You were hospitalized for a seizure like spell. Thank you for allowing [name] to be part of your care.   Please note these changes made to your medications:   Please START taking:  Keppra (Levitericetam)  Please STOP taking:  Topiramate and phentermine. Until you are able to follow up with your primary care provider and neurology to determine if they should be restarted.   Please make sure to follow up with your primary care provider and neurology.  Please call our clinic if you have any questions or concerns, we may be able to help and keep you from a long and expensive emergency room wait. Our clinic and after hours phone number is [phone], the best time to call is Monday through Friday 9 am to 4 pm but there is always someone available 24/7 if you have an emergency. If you need medication refills please notify your pharmacy one week in advance and they will send [name] a request.       Per [facility] statutes, patients with seizures are not allowed to drive until they have been seizure-free for six months.    Use caution when using heavy equipment or power tools. Avoid working on ladders or at heights. Take showers instead of baths. Ensure the water temperature is not too high on the home water heater. Do not go swimming alone. Do not lock yourself in a room alone (i.e. bathroom). When caring for infants or small children, sit down when holding, feeding, or changing them to minimize risk of injury to the child in the event you have a seizure. Maintain good sleep hygiene. Avoid alcohol.    If patient has another seizure, call 911 and bring them back to the ED if: A.  The seizure lasts longer than 5 minutes.      B.  The patient doesn't wake shortly after the seizure or has new problems such as difficulty seeing, speaking or moving following the seizure C.  The patient was injured during the seizure D.  The patient has a temperature over 102 F (39C) E.  The patient vomited during the  seizure and now is having trouble breathing

## 2022-10-29 NOTE — Hospital Course (Addendum)
Bryan Lee is a 61 year old male with PMH of HLD, right diaphragmatic palsy, OSA, class III obesity, and shingles who presented after falling out of bed with reported convulsions and is admitted for seizure work-up.  Seizure-like activity Syncope with convulsions Patient presented after falling out of his bed and striking his head with subsequent convulsions with rhythmic movement of his upper extremities.  Lasted 2 to 3 minutes.  No bowel or bladder incontinence.  He did have a prolonged postictal state which was improved with lorazepam.  No prior history of seizures in the past.  Now back at baseline.  No obvious cause of his seizure.  Did take Robitussin-DM as well as an over-the-counter sleep aid which may have made him more difficult to arouse.  No alcohol use or illicit drug use per patient. Lactic acid noted to be 9 improved to 2.  UA consistent with myoglobinuria. CT scan of the head/maxillofacial/C-spine and MRI of the head without acute abnormalities.  Neurology was consulted.  EEG showed rhythmic sharply contoured beta activity arising from the left frontal region which could be due to arousal but ictal activity cannot be excluded.  Patient quickly returned to baseline after arriving to the emergency department and receiving another dose of lorazepam and stayed baseline after this.  Patient was started on Keppra and remained at baseline.  He will follow-up with his primary care and neurology and will remain on Keppra.  Phentermine and topiramate held on discharge until decision of follow-up.  Seizure precautions including driving discussed.   Chest wall pain Patient got chest compressions during his downtime described above. Patient reported a dull aching sensation that is constant but minimal currently. Troponin noted to be elevated with a peak of 82.  EKG with tachycardia which quickly resolved.  No signs of ischemic changes.  Pain progressively improved with Tylenol.    Hypokalemia Potassium of 3.2 and Mag is 2.4.  Patient with a known history of hypokalemia and takes potassium at home.  This was repleted with oral potassium and normalized quickly.   AKI Serum creatinine of 1.29 on admission likely secondary to decreased renal perfusion during convulsions.  Creatinine 0.99 following morning.  No decreased urine output or other urinary symptoms.   URI Patient reported cough and nasal drainage for the past 1.5 weeks.  Felt that this was improving.  Home COVID test was negative.  Leukocytosis of 18.5 which decreased to 12.  He remained afebrile and leukocytosis likely secondary to convulsions.    OSA He wears a CPAP at home and we continued with CPAP at night during his admission.   Hyperlipidemia We continued his home statin during admission.   History of shingles Shingles outbreak 1 month ago was taking valacyclovir which is now completed.  Does have some hyperpigmentation of the left shoulder.  No vesicular lesions

## 2022-10-29 NOTE — Progress Notes (Signed)
[name] to be D/C'd home per MD order. Discussed with the patient and all questions fully answered.  Skin clean, dry and intact without evidence of skin break down, no evidence of skin tears noted.  IV catheters discontinued intact. Site without signs and symptoms of complications. Dressing and pressure applied.  An After Visit Summary was printed and given to the patient.  Patient escorted via [place], and D/C home via private auto.  Bryan Lee  10/29/2022 4:28 PM

## 2022-10-29 NOTE — TOC Transition Note (Signed)
Transition of Care [facility]) - CM/SW Discharge Note   Patient Details  Name: Bryan Lee MRN: [id number] Date of Birth: 1961/12/27  [facility] CM/SW Contact:  Pollie Friar, RN Phone Number: 10/29/2022, 4:07 PM   Clinical Narrative:    Pt is discharging home with self care. No needs per TOC.   Final next level of care: Home/Self Care Barriers to Discharge: No Barriers Identified   Patient Goals and CMS Choice        Discharge Placement                       Discharge Plan and Services                                     Social Determinants of Health (SDOH) Interventions     Readmission Risk Interventions     No data to display

## 2022-10-29 NOTE — Progress Notes (Signed)
PHARMACY - PHYSICIAN COMMUNICATION CRITICAL VALUE ALERT - BLOOD CULTURE IDENTIFICATION (BCID)  Bryan Lee is an 62 y.o. male who presented to [facility] on 10/28/2022 with a chief complaint of seizure  Assessment:  WBC 18.5>>>12.5, elevation was likely reactive due to seizures, coming down without anti-biotics  Name of physician (or Provider) Contacted: Dr. Sanjuana Mae  Current antibiotics: None  Changes to prescribed antibiotics recommended:  Cont to monitor off anti-biotics   Results for orders placed or performed during the hospital encounter of 10/28/22  Blood Culture ID Panel (Reflexed) (Collected: 10/28/2022  5:42 AM)  Result Value Ref Range   Enterococcus faecalis NOT DETECTED NOT DETECTED   Enterococcus Faecium NOT DETECTED NOT DETECTED   Listeria monocytogenes NOT DETECTED NOT DETECTED   Staphylococcus species DETECTED (A) NOT DETECTED   Staphylococcus aureus (BCID) NOT DETECTED NOT DETECTED   Staphylococcus epidermidis DETECTED (A) NOT DETECTED   Staphylococcus lugdunensis NOT DETECTED NOT DETECTED   Streptococcus species NOT DETECTED NOT DETECTED   Streptococcus agalactiae NOT DETECTED NOT DETECTED   Streptococcus pneumoniae NOT DETECTED NOT DETECTED   Streptococcus pyogenes NOT DETECTED NOT DETECTED   A.calcoaceticus-baumannii NOT DETECTED NOT DETECTED   Bacteroides fragilis NOT DETECTED NOT DETECTED   Enterobacterales NOT DETECTED NOT DETECTED   Enterobacter cloacae complex NOT DETECTED NOT DETECTED   Escherichia coli NOT DETECTED NOT DETECTED   Klebsiella aerogenes NOT DETECTED NOT DETECTED   Klebsiella oxytoca NOT DETECTED NOT DETECTED   Klebsiella pneumoniae NOT DETECTED NOT DETECTED   Proteus species NOT DETECTED NOT DETECTED   Salmonella species NOT DETECTED NOT DETECTED   Serratia marcescens NOT DETECTED NOT DETECTED   Haemophilus influenzae NOT DETECTED NOT DETECTED   Neisseria meningitidis NOT DETECTED NOT DETECTED   Pseudomonas aeruginosa NOT DETECTED  NOT DETECTED   Stenotrophomonas maltophilia NOT DETECTED NOT DETECTED   Candida albicans NOT DETECTED NOT DETECTED   Candida auris NOT DETECTED NOT DETECTED   Candida glabrata NOT DETECTED NOT DETECTED   Candida krusei NOT DETECTED NOT DETECTED   Candida parapsilosis NOT DETECTED NOT DETECTED   Candida tropicalis NOT DETECTED NOT DETECTED   Cryptococcus neoformans/gattii NOT DETECTED NOT DETECTED   Methicillin resistance mecA/C NOT DETECTED NOT DETECTED    Narda Bonds 10/29/2022  5:31 AM

## 2022-10-30 ENCOUNTER — Other Ambulatory Visit (HOSPITAL_COMMUNITY): Payer: BC Managed Care – PPO

## 2022-10-31 LAB — CULTURE, BLOOD (ROUTINE X 2): Special Requests: ADEQUATE

## 2022-11-02 ENCOUNTER — Telehealth: Payer: Self-pay

## 2022-11-02 LAB — CULTURE, BLOOD (ROUTINE X 2)
Culture: NO GROWTH
Special Requests: ADEQUATE

## 2022-11-02 NOTE — Telephone Encounter (Signed)
error 

## 2022-11-09 ENCOUNTER — Ambulatory Visit: Payer: BC Managed Care – PPO | Admitting: Neurology

## 2022-11-09 ENCOUNTER — Encounter: Payer: Self-pay | Admitting: Neurology

## 2022-11-09 VITALS — BP 137/68 | HR 69 | Ht 66.0 in | Wt 289.0 lb

## 2022-11-09 DIAGNOSIS — G40909 Epilepsy, unspecified, not intractable, without status epilepticus: Secondary | ICD-10-CM

## 2022-11-09 DIAGNOSIS — Z5181 Encounter for therapeutic drug level monitoring: Secondary | ICD-10-CM | POA: Diagnosis not present

## 2022-11-09 MED ORDER — LEVETIRACETAM 500 MG PO TABS: 500.0000 mg | ORAL_TABLET | Freq: Two times a day (BID) | ORAL | 11 refills | Status: DC

## 2022-11-09 NOTE — Patient Instructions (Signed)
Continue with Keppra 500 mg twice daily  We will check a Keppra level today  Continue your other medications  Follow up in 6 months, also discussed driving restriction for the next 6 months

## 2022-11-09 NOTE — Progress Notes (Signed)
[facility]  PATIENT: Bryan Lee DOB: 1961/10/10  REQUESTING CLINICIAN: Midge Minium, Lee HISTORY FROM: Patient  REASON FOR VISIT: New onset seizure    HISTORICAL  CHIEF COMPLAINT:  Chief Complaint  Patient presents with   New Patient (Initial Visit)    RM 59, alone  NP Bryan Lee ok per Dr Bryan Lee for new onset seizure Reports no new seizures     HISTORY OF PRESENT ILLNESS:  This is a 61 year old gentleman past medical history of obstructive sleep apnea on CPAP, obesity, hyperlipidemia who is presenting after his first lifetime seizure.  Patient reports on the night of November 1, he went to bed fine, like his normal self but he took a cold medication and also decongestant because he was having trouble breathing,nasal congestion.  He reports that he woke up on the floor, wife found him unresponsive bleeding because he fell of the bed and hit his head on the nightstand.  Daughter was on the phone with EMS, he was having labored breathing therefore CPR started.  He had tongue biting but no urinary incontinence. Patient said that the next and that he remembers clearly is being in the hospital and being asked questions.  He was told that he walked himself to the ambulance gurney. He denies any previous history of seizure but stated that he had 3 events in the past where he described it as the world slowing down.  Denies any other seizure risk factors. Since leaving the hospital he has been started on Keppra 500 mg twice daily, report side effect of somnolence and fatigue but feels like he is almost back to 90% of his normal self.   Handedness: Right handed   Onset:10/28/22  Seizure Type: unresponsiveness, generalized seizure  Current frequency: Only once but had 3 events in the past described as the world slowing down   Any injuries from seizures: tongue bite   Seizure risk factors: Denies   Previous ASMs: None   Currenty ASMs: Keppra 500  mg BID  ASMs side effects: fatigue  Brain Images: Normal   Previous EEGs: Normal     Hospital course.  Patient presented after falling out of his bed and striking his head with subsequent convulsions with rhythmic movement of his upper extremities.  Lasted 2 to 3 minutes.  No bowel or bladder incontinence.  He did have a prolonged postictal state which was improved with lorazepam.  No prior history of seizures in the past.  Now back at baseline.  No obvious cause of his seizure.  Did take Robitussin-DM as well as an over-the-counter sleep aid which may have made him more difficult to arouse.  No alcohol use or illicit drug use per patient. Lactic acid noted to be 9 improved to 2.  UA consistent with myoglobinuria. CT scan of the head/maxillofacial/C-spine and MRI of the head without acute abnormalities.  Neurology was consulted.  EEG showed rhythmic sharply contoured beta activity arising from the left frontal region which could be due to arousal but ictal activity cannot be excluded.  Patient quickly returned to baseline after arriving to the emergency department and receiving another dose of lorazepam and stayed baseline after this.  Patient was started on Keppra and remained at baseline.  He will follow-up with his primary care and neurology and will remain on Keppra.  Phentermine and topiramate held on discharge until decision of follow-up.  Seizure precautions including driving discussed   OTHER MEDICAL CONDITIONS: OSA on CPAP, Obesity, Hyperlipidemia  REVIEW OF SYSTEMS: Full 14 system review of systems performed and negative with exception of: As noted in the HPI   ALLERGIES: Allergies  Allergen Reactions   Lactose Intolerance (Gi) Other (See Comments)    Cramping and gas pain    HOME MEDICATIONS: Outpatient Medications Prior to Visit  Medication Sig Dispense Refill   atorvastatin (LIPITOR) 40 MG tablet Take 40 mg by mouth daily.     omega-3 acid ethyl esters (LOVAZA) 1 g capsule  Take 2 g by mouth 2 (two) times daily.     TURMERIC PO Take 1 capsule by mouth daily. Mix contents with drink of choice     Vitamin D, Ergocalciferol, (DRISDOL) 1.25 MG (50000 UT) CAPS capsule Take 1 capsule (50,000 Units total) by mouth every 7 (seven) days. (Patient taking differently: Take 50,000 Units by mouth every 7 (seven) days. Monday) 4 capsule 1   levETIRAcetam (KEPPRA) 500 MG tablet Take 1 tablet (500 mg total) by mouth 2 (two) times daily. 60 tablet 0   Insulin Pen Needle (BD PEN NEEDLE NANO 2ND GEN) 32G X 4 MM MISC 1 Package by Does not apply route 2 (two) times daily. 100 each 0   valACYclovir (VALTREX) 1000 MG tablet Take 1,000 mg by mouth 2 (two) times daily.     No facility-administered medications prior to visit.    PAST MEDICAL HISTORY: Past Medical History:  Diagnosis Date   Achilles rupture    Acid reflux    Back pain    Carpal tunnel syndrome    Chest pain    Constipation    Dizzy spells    Joint pain    Lactose intolerance    Lipoma    Numbness    legs, hands   Obesity    Plantar fasciitis    Shoulder pain    rotator cuff   SOB (shortness of breath) on exertion     PAST SURGICAL HISTORY: Past Surgical History:  Procedure Laterality Date   ACHILLES TENDON REPAIR     left   HERNIA REPAIR     lump removal     VASECTOMY      FAMILY HISTORY: Family History  Problem Relation Age of Onset   Coronary artery disease Father    Heart failure Father    Hypertension Father    Sleep apnea Father    Heart disease Father    Arthritis Mother    Obesity Mother    Diabetes Unknown        grandmother   Cancer Sister        GYN-type cancer   GER disease Daughter    GER disease Son    Diabetes Maternal Grandmother    Cancer Maternal Grandmother        ovarian   Cancer Paternal Grandmother        lung cancer, + tobacco and colon   Seizures Sister     SOCIAL HISTORY: Social History   Socioeconomic History   Marital status: Married    Spouse name:  Systems developer   Number of children: 2   Years of education: Master's Degree   Highest education level: Not on file  Occupational History   Occupation: VP Operations    Comment: AT [employer]  Tobacco Use   Smoking status: Never   Smokeless tobacco: Never  Vaping Use   Vaping Use: Never used  Substance and Sexual Activity   Alcohol use: Yes    Comment: social   Drug use: No  Sexual activity: Yes    Partners: Female    Birth control/protection: Surgical  Other Topics Concern   Not on file  Social History Narrative   Lives with his wife and children.   Social Determinants of Health   Financial Resource Strain: Not on file  Food Insecurity: Not on file  Transportation Needs: Not on file  Physical Activity: Not on file  Stress: Not on file  Social Connections: Not on file  Intimate Partner Violence: Not on file    PHYSICAL EXAM  GENERAL EXAM/CONSTITUTIONAL: Vitals:  Vitals:   11/09/22 1004  BP: 137/68  Pulse: 69  Weight: 289 lb (131.1 kg)  Height: '5\' 6"'$  (1.676 m)   Body mass index is 46.65 kg/m. Wt Readings from Last 3 Encounters:  11/09/22 289 lb (131.1 kg)  10/28/22 280 lb (127 kg)  01/16/19 241 lb (109.3 kg)   Patient is in no distress; well developed, nourished and groomed; neck is supple  EYES: Pupils round and reactive to light, Visual fields full to confrontation, Extraocular movements intacts,  No results found.  MUSCULOSKELETAL: Gait, strength, tone, movements noted in Neurologic exam below  NEUROLOGIC: MENTAL STATUS:      No data to display         awake, alert, oriented to person, place and time recent and remote memory intact normal attention and concentration language fluent, comprehension intact, naming intact fund of knowledge appropriate  CRANIAL NERVE:  2nd, 3rd, 4th, 6th - pupils equal and reactive to light, visual fields full to confrontation, extraocular muscles intact, no nystagmus 5th - facial sensation symmetric 7th - facial  strength symmetric 8th - hearing intact 9th - palate elevates symmetrically, uvula midline 11th - shoulder shrug symmetric 12th - tongue protrusion midline  MOTOR:  normal bulk and tone, full strength in the BUE, BLE  SENSORY:  normal and symmetric to light touch  COORDINATION:  finger-nose-finger, fine finger movements normal  REFLEXES:  deep tendon reflexes present and symmetric  GAIT/STATION:  normal   DIAGNOSTIC DATA (LABS, IMAGING, TESTING) - I reviewed patient records, labs, notes, testing and imaging myself where available.  Lab Results  Component Value Date   WBC 12.5 (H) 10/29/2022   HGB 15.6 10/29/2022   HCT 46.5 10/29/2022   MCV 86.0 10/29/2022   PLT 281 10/29/2022      Component Value Date/Time   NA 141 10/29/2022 0404   NA 142 10/11/2018 0829   K 3.6 10/29/2022 0404   CL 98 10/29/2022 0404   CO2 31 10/29/2022 0404   GLUCOSE 114 (H) 10/29/2022 0404   BUN 8 10/29/2022 0404   BUN 16 10/11/2018 0829   CREATININE 0.99 10/29/2022 0404   CREATININE 0.89 09/08/2013 1631   CALCIUM 9.1 10/29/2022 0404   PROT 7.8 10/28/2022 0525   PROT 7.3 10/11/2018 0829   ALBUMIN 3.4 (L) 10/28/2022 0525   ALBUMIN 4.0 10/11/2018 0829   AST 57 (H) 10/28/2022 0525   ALT 53 (H) 10/28/2022 0525   ALKPHOS 53 10/28/2022 0525   BILITOT 0.7 10/28/2022 0525   BILITOT 0.4 10/11/2018 0829   GFRNONAA >60 10/29/2022 0404   GFRAA 94 10/11/2018 0829   Lab Results  Component Value Date   CHOL 223 (H) 10/11/2018   HDL 42 10/11/2018   LDLCALC 167 (H) 10/11/2018   TRIG 69 10/11/2018   Lab Results  Component Value Date   HGBA1C 5.2 10/11/2018   Lab Results  Component Value Date   VITAMINB12 1,102 06/07/2018   Lab  Results  Component Value Date   TSH 0.826 06/07/2018    MRI Brain 10/28/22 1. No evidence of acute intracranial abnormality. 2. No specific seizure focus is identified. 3. Mild multifocal T2 FLAIR hyperintense signal abnormality within the cerebral white matter,  nonspecific but most often secondary to chronic small vessel ischemia. 4. Age-indeterminate right orbital floor fracture. 5. Paranasal sinus disease, as described.   Routine EEG 10/28/22 This study is within normal limits. No seizures or epileptiform discharges were seen throughout the recording. EEG showed rhythmic sharply contoured beta activity arising from left frontal region at 1529  which could be due to arousal. However, cannot completely exclude ictal activity. Please consider LTM eeg if concern for seizure persists.   I personally reviewed brain Images and previous EEG reports.   ASSESSMENT AND PLAN  61 y.o. year old male  with history of sleep apnea, obesity, dyslipidemia who is presenting with after his first lifetime seizure described as falling off the bed, unresponsiveness, tongue biting with postictal confusion.  He also did report 3 episodes in the past where he described as the world slowing down.  He has been started on Keppra 500 mg twice daily and tolerating the medication well now.  I will check a Keppra level but we will continue patient on Keppra.  We also discussed driving restriction for the next 6 months and he voices understanding.  Continue your other medications, continue with exercise and follow-up in 6 months or sooner if worse    1. Seizure disorder ([place])   2. Therapeutic drug monitoring     Patient Instructions  Continue with Keppra 500 mg twice daily  We will check a Keppra level today  Continue your other medications  Follow up in 6 months, also discussed driving restriction for the next 6 months    Per [facility] statutes, patients with seizures are not allowed to drive until they have been seizure-free for six months.  Other recommendations include using caution when using heavy equipment or power tools. Avoid working on ladders or at heights. Take showers instead of baths.  Do not swim alone.  Ensure the water temperature is not too high on  the home water heater. Do not go swimming alone. Do not lock yourself in a room alone (i.e. bathroom). When caring for infants or small children, sit down when holding, feeding, or changing them to minimize risk of injury to the child in the event you have a seizure. Maintain good sleep hygiene. Avoid alcohol.  Also recommend adequate sleep, hydration, good diet and minimize stress.   During the Seizure  - First, ensure adequate ventilation and place patients on the floor on their left side  Loosen clothing around the neck and ensure the airway is patent. If the patient is clenching the teeth, do not force the mouth open with any object as this can cause severe damage - Remove all items from the surrounding that can be hazardous. The patient may be oblivious to what's happening and may not even know what he or she is doing. If the patient is confused and wandering, either gently guide him/her away and block access to outside areas - Reassure the individual and be comforting - Call 911. In most cases, the seizure ends before EMS arrives. However, there are cases when seizures may last over 3 to 5 minutes. Or the individual may have developed breathing difficulties or severe injuries. If a pregnant patient or a person with diabetes develops a seizure, it  is prudent to call an ambulance. - Finally, if the patient does not regain full consciousness, then call EMS. Most patients will remain confused for about 45 to 90 minutes after a seizure, so you must use judgment in calling for help. - Avoid restraints but make sure the patient is in a bed with padded side rails - Place the individual in a lateral position with the neck slightly flexed; this will help the saliva drain from the mouth and prevent the tongue from falling backward - Remove all nearby furniture and other hazards from the area - Provide verbal assurance as the individual is regaining consciousness - Provide the patient with privacy if  possible - Call for help and start treatment as ordered by the caregiver   After the Seizure (Postictal Stage)  After a seizure, most patients experience confusion, fatigue, muscle pain and/or a headache. Thus, one should permit the individual to sleep. For the next few days, reassurance is essential. Being calm and helping reorient the person is also of importance.  Most seizures are painless and end spontaneously. Seizures are not harmful to others but can lead to complications such as stress on the lungs, brain and the heart. Individuals with prior lung problems may develop labored breathing and respiratory distress.     Orders Placed This Encounter  Procedures   Levetiracetam level    Meds ordered this encounter  Medications   levETIRAcetam (KEPPRA) 500 MG tablet    Sig: Take 1 tablet (500 mg total) by mouth 2 (two) times daily.    Dispense:  60 tablet    Refill:  11    Return in about 6 months (around 05/10/2023).    Alric Ran, Lee 11/09/2022, 10:59 AM  [facility] [address] [place], [place] [address] [phone]

## 2022-11-11 LAB — LEVETIRACETAM LEVEL: Levetiracetam Lvl: 5 ug/mL — ABNORMAL LOW (ref 10.0–40.0)

## 2023-05-11 ENCOUNTER — Encounter: Payer: Self-pay | Admitting: Neurology

## 2023-05-11 ENCOUNTER — Ambulatory Visit: Payer: BC Managed Care – PPO | Admitting: Neurology

## 2023-05-11 VITALS — BP 124/82 | Ht 66.0 in | Wt 277.0 lb

## 2023-05-11 DIAGNOSIS — G40909 Epilepsy, unspecified, not intractable, without status epilepticus: Secondary | ICD-10-CM | POA: Diagnosis not present

## 2023-05-11 MED ORDER — LEVETIRACETAM 500 MG PO TABS: 500.0000 mg | ORAL_TABLET | Freq: Two times a day (BID) | ORAL | 4 refills | Status: DC

## 2023-05-11 NOTE — Progress Notes (Signed)
[facility]  PATIENT: Bryan Lee DOB: [birth year]  REQUESTING CLINICIAN: [facility] HISTORY FROM: Patient  REASON FOR VISIT: Seizure follow up    HISTORICAL  CHIEF COMPLAINT:  Chief Complaint  Patient presents with   Follow-up    RM 16, alone, F/U seizures, no seizures since last visit   INTERVAL HISTORY 05/11/2023:  Patient presents today for follow-up, he is alone.  Last visit was in November, since then he has not had any seizure or seizure activity.  He is compliant with the Keppra 500 mg twice daily but do acknowledge that sometimes he will forget to take his medication.  He denies any seizure or seizure activity.  He also reports the episode of world slowing down has not happened while he is on the Keppra.  Denies any side effect from the medicine.  His last Keppra level was at 5 but again he stated sometimes he will forget to take his medication no breakthrough seizures.   HISTORY OF PRESENT ILLNESS:  This is a 62 year old gentleman past medical history of obstructive sleep apnea on CPAP, obesity, hyperlipidemia who is presenting after his first lifetime seizure.  Patient reports on the night of November 1, he went to bed fine, like his normal self but he took a cold medication and also decongestant because he was having trouble breathing,nasal congestion.  He reports that he woke up on the floor, wife found him unresponsive bleeding because he fell of the bed and hit his head on the nightstand.  Daughter was on the phone with EMS, he was having labored breathing therefore CPR started.  He had tongue biting but no urinary incontinence. Patient said that the next and that he remembers clearly is being in the hospital and being asked questions.  He was told that he walked himself to the ambulance gurney. He denies any previous history of seizure but stated that he had 3 events in the past where he described it as the world slowing down.  Denies any  other seizure risk factors. Since leaving the hospital he has been started on Keppra 500 mg twice daily, report side effect of somnolence and fatigue but feels like he is almost back to 90% of his normal self.   Handedness: Right handed   Onset:10/28/22  Seizure Type: unresponsiveness, generalized seizure  Current frequency: Only once but had 3 events in the past described as the world slowing down   Any injuries from seizures: tongue bite   Seizure risk factors: Denies   Previous ASMs: None   Currenty ASMs: Keppra 500 mg BID  ASMs side effects: fatigue  Brain Images: Normal   Previous EEGs: Normal     Hospital course.  Patient presented after falling out of his bed and striking his head with subsequent convulsions with rhythmic movement of his upper extremities.  Lasted 2 to 3 minutes.  No bowel or bladder incontinence.  He did have a prolonged postictal state which was improved with lorazepam.  No prior history of seizures in the past.  Now back at baseline.  No obvious cause of his seizure.  Did take Robitussin-DM as well as an over-the-counter sleep aid which may have made him more difficult to arouse.  No alcohol use or illicit drug use per patient. Lactic acid noted to be 9 improved to 2.  UA consistent with myoglobinuria. CT scan of the head/maxillofacial/C-spine and MRI of the head without acute abnormalities.  Neurology was consulted.  EEG showed rhythmic sharply  contoured beta activity arising from the left frontal region which could be due to arousal but ictal activity cannot be excluded.  Patient quickly returned to baseline after arriving to the emergency department and receiving another dose of lorazepam and stayed baseline after this.  Patient was started on Keppra and remained at baseline.  He will follow-up with his primary care and neurology and will remain on Keppra.  Phentermine and topiramate held on discharge until decision of follow-up.  Seizure precautions  including driving discussed   OTHER MEDICAL CONDITIONS: OSA on CPAP, Obesity, Hyperlipidemia   REVIEW OF SYSTEMS: Full 14 system review of systems performed and negative with exception of: As noted in the HPI   ALLERGIES: Allergies  Allergen Reactions   Lactose Intolerance (Gi) Other (See Comments)    Cramping and gas pain    HOME MEDICATIONS: Outpatient Medications Prior to Visit  Medication Sig Dispense Refill   atorvastatin (LIPITOR) 40 MG tablet Take 40 mg by mouth daily.     omega-3 acid ethyl esters (LOVAZA) 1 g capsule Take 2 g by mouth 2 (two) times daily.     potassium chloride (MICRO-K) 10 MEQ CR capsule Take 10 mEq by mouth 2 (two) times daily.     TURMERIC PO Take 1 capsule by mouth daily. Mix contents with drink of choice     Vitamin D, Ergocalciferol, (DRISDOL) 1.25 MG (50000 UT) CAPS capsule Take 1 capsule (50,000 Units total) by mouth every 7 (seven) days. (Patient taking differently: Take 50,000 Units by mouth every 7 (seven) days. Monday) 4 capsule 1   levETIRAcetam (KEPPRA) 500 MG tablet Take 1 tablet (500 mg total) by mouth 2 (two) times daily. 60 tablet 11   No facility-administered medications prior to visit.    PAST MEDICAL HISTORY: Past Medical History:  Diagnosis Date   Achilles rupture    Acid reflux    Back pain    Carpal tunnel syndrome    Chest pain    Constipation    Dizzy spells    Joint pain    Lactose intolerance    Lipoma    Numbness    legs, hands   Obesity    Plantar fasciitis    Seizures (HCC)    Shoulder pain    rotator cuff   SOB (shortness of breath) on exertion     PAST SURGICAL HISTORY: Past Surgical History:  Procedure Laterality Date   ACHILLES TENDON REPAIR     left   HERNIA REPAIR     lump removal     VASECTOMY      FAMILY HISTORY: Family History  Problem Relation Age of Onset   Coronary artery disease Father    Heart failure Father    Hypertension Father    Sleep apnea Father    Heart disease Father     Arthritis Mother    Obesity Mother    Diabetes Unknown        grandmother   Cancer Sister        GYN-type cancer   GER disease Daughter    GER disease Son    Diabetes Maternal Grandmother    Cancer Maternal Grandmother        ovarian   Cancer Paternal Grandmother        lung cancer, + tobacco and colon   Seizures Sister     SOCIAL HISTORY: Social History   Socioeconomic History   Marital status: Married    Spouse name: Debarah Crape   Number of  children: 2   Years of education: Master's Degree   Highest education level: Not on file  Occupational History   Occupation: VP Operations    Comment: AT [employer]  Tobacco Use   Smoking status: Never   Smokeless tobacco: Never  Vaping Use   Vaping Use: Never used  Substance and Sexual Activity   Alcohol use: Yes   Drug use: No   Sexual activity: Yes    Partners: Female    Birth control/protection: Surgical  Other Topics Concern   Not on file  Social History Narrative   Lives with his wife and child.   Right handed   Caffeine-16-24oz daily   Social Determinants of Health   Financial Resource Strain: Not on file  Food Insecurity: Not on file  Transportation Needs: Not on file  Physical Activity: Not on file  Stress: Not on file  Social Connections: Not on file  Intimate Partner Violence: Not on file    PHYSICAL EXAM  GENERAL EXAM/CONSTITUTIONAL: Vitals:  Vitals:   05/11/23 1016  BP: 124/82  Weight: 277 lb (125.6 kg)  Height: 5\' 6"  (1.676 m)   Body mass index is 44.71 kg/m. Wt Readings from Last 3 Encounters:  05/11/23 277 lb (125.6 kg)  11/09/22 289 lb (131.1 kg)  10/28/22 280 lb (127 kg)   Patient is in no distress; well developed, nourished and groomed; neck is supple  MUSCULOSKELETAL: Gait, strength, tone, movements noted in Neurologic exam below  NEUROLOGIC: MENTAL STATUS:      No data to display         awake, alert, oriented to person, place and time recent and remote memory intact normal  attention and concentration language fluent, comprehension intact, naming intact fund of knowledge appropriate  CRANIAL NERVE:  2nd, 3rd, 4th, 6th - visual fields full to confrontation, extraocular muscles intact, no nystagmus 5th - facial sensation symmetric 7th - facial strength symmetric 8th - hearing intact 9th - palate elevates symmetrically, uvula midline 11th - shoulder shrug symmetric 12th - tongue protrusion midline  MOTOR:  normal bulk and tone, full strength in the BUE, BLE  SENSORY:  normal and symmetric to light touch  COORDINATION:  finger-nose-finger, fine finger movements normal  REFLEXES:  deep tendon reflexes present and symmetric  GAIT/STATION:  normal   DIAGNOSTIC DATA (LABS, IMAGING, TESTING) - I reviewed patient records, labs, notes, testing and imaging myself where available.  Lab Results  Component Value Date   WBC 12.5 (H) 10/29/2022   HGB 15.6 10/29/2022   HCT 46.5 10/29/2022   MCV 86.0 10/29/2022   PLT 281 10/29/2022      Component Value Date/Time   NA 141 10/29/2022 0404   NA 142 10/11/2018 0829   K 3.6 10/29/2022 0404   CL 98 10/29/2022 0404   CO2 31 10/29/2022 0404   GLUCOSE 114 (H) 10/29/2022 0404   BUN 8 10/29/2022 0404   BUN 16 10/11/2018 0829   CREATININE 0.99 10/29/2022 0404   CREATININE 0.89 09/08/2013 1631   CALCIUM 9.1 10/29/2022 0404   PROT 7.8 10/28/2022 0525   PROT 7.3 10/11/2018 0829   ALBUMIN 3.4 (L) 10/28/2022 0525   ALBUMIN 4.0 10/11/2018 0829   AST 57 (H) 10/28/2022 0525   ALT 53 (H) 10/28/2022 0525   ALKPHOS 53 10/28/2022 0525   BILITOT 0.7 10/28/2022 0525   BILITOT 0.4 10/11/2018 0829   GFRNONAA >60 10/29/2022 0404   GFRAA 94 10/11/2018 0829   Lab Results  Component Value Date  CHOL 223 (H) 10/11/2018   HDL 42 10/11/2018   LDLCALC 167 (H) 10/11/2018   TRIG 69 10/11/2018   Lab Results  Component Value Date   HGBA1C 5.2 10/11/2018   Lab Results  Component Value Date   VITAMINB12 1,102  06/07/2018   Lab Results  Component Value Date   TSH 0.826 06/07/2018    MRI Brain 10/28/22 1. No evidence of acute intracranial abnormality. 2. No specific seizure focus is identified. 3. Mild multifocal T2 FLAIR hyperintense signal abnormality within the cerebral white matter, nonspecific but most often secondary to chronic small vessel ischemia. 4. Age-indeterminate right orbital floor fracture. 5. Paranasal sinus disease, as described.   Routine EEG 10/28/22 This study is within normal limits. No seizures or epileptiform discharges were seen throughout the recording. EEG showed rhythmic sharply contoured beta activity arising from left frontal region at 1529  which could be due to arousal. However, cannot completely exclude ictal activity. Please consider LTM eeg if concern for seizure persists.   I personally reviewed brain Images and previous EEG reports.   ASSESSMENT AND PLAN  62 y.o. year old male  with history of sleep apnea, obesity, dyslipidemia who is presenting for seizure follow up.  He is doing well on Keppra 500 mg twice daily, denies any seizures, denies any side effect of the medication.  Plan will be to continue patient on Keppra 500 mg twice daily and to see him in 1 year for follow-up.  He understands to contact us if he does have a breakthrough seizure.  I informed patient that since his last seizure was 6 months ago, he can resume driving.    1. Seizure disorder [facility])      Patient Instructions  Continue with Keppra 500 mg twice daily, Can resume driving, your last seizure was more than 6 months ago Continue to follow with PCP Follow-up in 1 year or sooner if worse   Per [facility] statutes, patients with seizures are not allowed to drive until they have been seizure-free for six months.  Other recommendations include using caution when using heavy equipment or power tools. Avoid working on ladders or at heights. Take showers instead of baths.  Do  not swim alone.  Ensure the water temperature is not too high on the home water heater. Do not go swimming alone. Do not lock yourself in a room alone (i.e. bathroom). When caring for infants or small children, sit down when holding, feeding, or changing them to minimize risk of injury to the child in the event you have a seizure. Maintain good sleep hygiene. Avoid alcohol.  Also recommend adequate sleep, hydration, good diet and minimize stress.   During the Seizure  - First, ensure adequate ventilation and place patients on the floor on their left side  Loosen clothing around the neck and ensure the airway is patent. If the patient is clenching the teeth, do not force the mouth open with any object as this can cause severe damage - Remove all items from the surrounding that can be hazardous. The patient may be oblivious to what's happening and may not even know what he or she is doing. If the patient is confused and wandering, either gently guide him/her away and block access to outside areas - Reassure the individual and be comforting - Call 911. In most cases, the seizure ends before EMS arrives. However, there are cases when seizures may last over 3 to 5 minutes. Or the individual may have developed  breathing difficulties or severe injuries. If a pregnant patient or a person with diabetes develops a seizure, it is prudent to call an ambulance. - Finally, if the patient does not regain full consciousness, then call EMS. Most patients will remain confused for about 45 to 90 minutes after a seizure, so you must use judgment in calling for help. - Avoid restraints but make sure the patient is in a bed with padded side rails - Place the individual in a lateral position with the neck slightly flexed; this will help the saliva drain from the mouth and prevent the tongue from falling backward - Remove all nearby furniture and other hazards from the area - Provide verbal assurance as the individual is  regaining consciousness - Provide the patient with privacy if possible - Call for help and start treatment as ordered by the caregiver   After the Seizure (Postictal Stage)  After a seizure, most patients experience confusion, fatigue, muscle pain and/or a headache. Thus, one should permit the individual to sleep. For the next few days, reassurance is essential. Being calm and helping reorient the person is also of importance.  Most seizures are painless and end spontaneously. Seizures are not harmful to others but can lead to complications such as stress on the lungs, brain and the heart. Individuals with prior lung problems may develop labored breathing and respiratory distress.     No orders of the defined types were placed in this encounter.   Meds ordered this encounter  Medications   levETIRAcetam (KEPPRA) 500 MG tablet    Sig: Take 1 tablet (500 mg total) by mouth 2 (two) times daily.    Dispense:  180 tablet    Refill:  4    Return in about 1 year (around 05/10/2024).    Windell Norfolk, MD 05/11/2023, 10:39 AM  [facility] [address], [address] [place], [place] [address] [phone]

## 2023-05-11 NOTE — Patient Instructions (Signed)
Continue with Keppra 500 mg twice daily, Can resume driving, your last seizure was more than 6 months ago Continue to follow with PCP Follow-up in 1 year or sooner if worse

## 2024-05-15 NOTE — Progress Notes (Deleted)
 No chief complaint on file.   HISTORY OF PRESENT ILLNESS:  05/15/24 ALL:  Bryan Lee is a 63 y.o. male here today for follow up for seizures. He was last seen by Dr Samara Crest 04/2023 and doing well on levetiracetam  500mg  BID. Since,    HISTORY (copied from Dr Elspeth Hals previous note)  INTERVAL HISTORY 05/11/2023:  Patient presents today for follow-up, he is alone.  Last visit was in November, since then he has not had any seizure or seizure activity.  He is compliant with the Keppra  500 mg twice daily but do acknowledge that sometimes he will forget to take his medication.  He denies any seizure or seizure activity.  He also reports the episode of world slowing down has not happened while he is on the Keppra .  Denies any side effect from the medicine.  His last Keppra  level was at 5 but again he stated sometimes he will forget to take his medication no breakthrough seizures.   HISTORY OF PRESENT ILLNESS:  This is a 63 year old gentleman past medical history of obstructive sleep apnea on CPAP, obesity, hyperlipidemia who is presenting after his first lifetime seizure.  Patient reports on the night of November 1, he went to bed fine, like his normal self but he took a cold medication and also decongestant because he was having trouble breathing,nasal congestion.  He reports that he woke up on the floor, wife found him unresponsive bleeding because he fell of the bed and hit his head on the nightstand.  Daughter was on the phone with EMS, he was having labored breathing therefore CPR started.  He had tongue biting but no urinary incontinence. Patient said that the next and that he remembers clearly is being in the hospital and being asked questions.  He was told that he walked himself to the ambulance gurney. He denies any previous history of seizure but stated that he had 3 events in the past where he described it as the world slowing down.  Denies any other seizure risk factors. Since leaving  the hospital he has been started on Keppra  500 mg twice daily, report side effect of somnolence and fatigue but feels like he is almost back to 90% of his normal self.    Handedness: Right handed    Onset:10/28/22   Seizure Type: unresponsiveness, generalized seizure   Current frequency: Only once but had 3 events in the past described as the world slowing down    Any injuries from seizures: tongue bite    Seizure risk factors: Denies    Previous ASMs: None    Currenty ASMs: Keppra  500 mg BID   ASMs side effects: fatigue   Brain Images: Normal    Previous EEGs: Normal      Hospital course.  Patient presented after falling out of his bed and striking his head with subsequent convulsions with rhythmic movement of his upper extremities.  Lasted 2 to 3 minutes.  No bowel or bladder incontinence.  He did have a prolonged postictal state which was improved with lorazepam.  No prior history of seizures in the past.  Now back at baseline.  No obvious cause of his seizure.  Did take Robitussin-DM as well as an over-the-counter sleep aid which may have made him more difficult to arouse.  No alcohol use or illicit drug use per patient. Lactic acid noted to be 9 improved to 2.  UA consistent with myoglobinuria. CT scan of the head/maxillofacial/C-spine and MRI of the head  without acute abnormalities.  Neurology was consulted.  EEG showed rhythmic sharply contoured beta activity arising from the left frontal region which could be due to arousal but ictal activity cannot be excluded.  Patient quickly returned to baseline after arriving to the emergency department and receiving another dose of lorazepam and stayed baseline after this.  Patient was started on Keppra  and remained at baseline.  He will follow-up with his primary care and neurology and will remain on Keppra .  Phentermine  and topiramate  held on discharge until decision of follow-up.  Seizure precautions including driving discussed    OTHER  MEDICAL CONDITIONS: OSA on CPAP, Obesity, Hyperlipidemia    REVIEW OF SYSTEMS: Out of a complete 14 system review of symptoms, the patient complains only of the following symptoms, and all other reviewed systems are negative.   ALLERGIES: Allergies  Allergen Reactions   Lactose Intolerance (Gi) Other (See Comments)    Cramping and gas pain     HOME MEDICATIONS: Outpatient Medications Prior to Visit  Medication Sig Dispense Refill   atorvastatin  (LIPITOR) 40 MG tablet Take 40 mg by mouth daily.     levETIRAcetam  (KEPPRA ) 500 MG tablet Take 1 tablet (500 mg total) by mouth 2 (two) times daily. 180 tablet 4   omega-3 acid ethyl esters (LOVAZA) 1 g capsule Take 2 g by mouth 2 (two) times daily.     potassium chloride  (MICRO-K ) 10 MEQ CR capsule Take 10 mEq by mouth 2 (two) times daily.     TURMERIC PO Take 1 capsule by mouth daily. Mix contents with drink of choice     Vitamin D , Ergocalciferol , (DRISDOL ) 1.25 MG (50000 UT) CAPS capsule Take 1 capsule (50,000 Units total) by mouth every 7 (seven) days. (Patient taking differently: Take 50,000 Units by mouth every 7 (seven) days. Monday) 4 capsule 1   No facility-administered medications prior to visit.     PAST MEDICAL HISTORY: Past Medical History:  Diagnosis Date   Achilles rupture    Acid reflux    Back pain    Carpal tunnel syndrome    Chest pain    Constipation    Dizzy spells    Joint pain    Lactose intolerance    Lipoma    Numbness    legs, hands   Obesity    Plantar fasciitis    Seizures (HCC)    Shoulder pain    rotator cuff   SOB (shortness of breath) on exertion      PAST SURGICAL HISTORY: Past Surgical History:  Procedure Laterality Date   ACHILLES TENDON REPAIR     left   HERNIA REPAIR     lump removal     VASECTOMY       FAMILY HISTORY: Family History  Problem Relation Age of Onset   Coronary artery disease Father    Heart failure Father    Hypertension Father    Sleep apnea Father     Heart disease Father    Arthritis Mother    Obesity Mother    Diabetes Unknown        grandmother   Cancer Sister        GYN-type cancer   GER disease Daughter    GER disease Son    Diabetes Maternal Grandmother    Cancer Maternal Grandmother        ovarian   Cancer Paternal Grandmother        lung cancer, + tobacco and colon   Seizures Sister  SOCIAL HISTORY: Social History   Socioeconomic History   Marital status: Married    Spouse name: Claudia   Number of children: 2   Years of education: Master's Degree   Highest education level: Not on file  Occupational History   Occupation: VP Operations    Comment: AT [employer]  Tobacco Use   Smoking status: Never   Smokeless tobacco: Never  Vaping Use   Vaping status: Never Used  Substance and Sexual Activity   Alcohol use: Yes   Drug use: No   Sexual activity: Yes    Partners: Female    Birth control/protection: Surgical  Other Topics Concern   Not on file  Social History Narrative   Lives with his wife and child.   Right handed   Caffeine-16-24oz daily   Social Drivers of Health   Financial Resource Strain: Low Risk  (03/05/2023)   Received from [facility], [facility]   Overall Financial Resource Strain (CARDIA)    Difficulty of Paying Living Expenses: Not hard at all  Food Insecurity: No Food Insecurity (03/05/2023)   Received from [facility], [facility]   Hunger Vital Sign    Worried About Running Out of Food in the Last Year: Never true    Ran Out of Food in the Last Year: Never true  Transportation Needs: No Transportation Needs (03/05/2023)   Received from [facility], [facility]   PRAPARE - Transportation    Lack of Transportation (Medical): No    Lack of Transportation (Non-Medical): No  Physical Activity: Insufficiently Active (03/05/2023)   Received from [facility], [facility]   Exercise Vital Sign    Days of Exercise per Week: 1 day    Minutes of Exercise per Session: 10 min   Stress: No Stress Concern Present (03/05/2023)   Received from [facility], [facility] - Occupational Stress Questionnaire    Feeling of Stress : Not at all  Social Connections: Socially Integrated (03/05/2023)   Received from [facility], [facility]   Social Network    How would you rate your social network (family, work, friends)?: Good participation with social networks  Intimate Partner Violence: Not At Risk (03/05/2023)   Received from [facility], [facility]   HITS    Over the last 12 months how often did your partner physically hurt you?: Never    Over the last 12 months how often did your partner insult you or talk down to you?: Never    Over the last 12 months how often did your partner threaten you with physical harm?: Never    Over the last 12 months how often did your partner scream or curse at you?: Never     PHYSICAL EXAM  There were no vitals filed for this visit. There is no height or weight on file to calculate BMI.  Generalized: Well developed, in no acute distress  Cardiology: normal rate and rhythm, no murmur auscultated  Respiratory: clear to auscultation bilaterally    Neurological examination  Mentation: Alert oriented to time, place, history taking. Follows all commands speech and language fluent Cranial nerve II-XII: Pupils were equal round reactive to light. Extraocular movements were full, visual field were full on confrontational test. Facial sensation and strength were normal. Uvula tongue midline. Head turning and shoulder shrug  were normal and symmetric. Motor: The motor testing reveals 5 over 5 strength of all 4 extremities. Good symmetric motor tone is noted throughout.  Sensory: Sensory testing is intact to soft touch on all 4 extremities. No evidence of extinction is noted.  Coordination: Cerebellar testing reveals good finger-nose-finger and heel-to-shin bilaterally.  Gait and station: Gait is  normal. Tandem gait is normal. Romberg is negative. No drift is seen.  Reflexes: Deep tendon reflexes are symmetric and normal bilaterally.    DIAGNOSTIC DATA (LABS, IMAGING, TESTING) - I reviewed patient records, labs, notes, testing and imaging myself where available.  Lab Results  Component Value Date   WBC 12.5 (H) 10/29/2022   HGB 15.6 10/29/2022   HCT 46.5 10/29/2022   MCV 86.0 10/29/2022   PLT 281 10/29/2022      Component Value Date/Time   NA 141 10/29/2022 0404   NA 142 10/11/2018 0829   K 3.6 10/29/2022 0404   CL 98 10/29/2022 0404   CO2 31 10/29/2022 0404   GLUCOSE 114 (H) 10/29/2022 0404   BUN 8 10/29/2022 0404   BUN 16 10/11/2018 0829   CREATININE 0.99 10/29/2022 0404   CREATININE 0.89 09/08/2013 1631   CALCIUM  9.1 10/29/2022 0404   PROT 7.8 10/28/2022 0525   PROT 7.3 10/11/2018 0829   ALBUMIN 3.4 (L) 10/28/2022 0525   ALBUMIN 4.0 10/11/2018 0829   AST 57 (H) 10/28/2022 0525   ALT 53 (H) 10/28/2022 0525   ALKPHOS 53 10/28/2022 0525   BILITOT 0.7 10/28/2022 0525   BILITOT 0.4 10/11/2018 0829   GFRNONAA >60 10/29/2022 0404   GFRAA 94 10/11/2018 0829   Lab Results  Component Value Date   CHOL 223 (H) 10/11/2018   HDL 42 10/11/2018   LDLCALC 167 (H) 10/11/2018   TRIG 69 10/11/2018   CHOLHDL 6 11/07/2015   Lab Results  Component Value Date   HGBA1C 5.2 10/11/2018   Lab Results  Component Value Date   VITAMINB12 1,102 06/07/2018   Lab Results  Component Value Date   TSH 0.826 06/07/2018        No data to display               No data to display           ASSESSMENT AND PLAN  63 y.o. year old male  has a past medical history of Achilles rupture, Acid reflux, Back pain, Carpal tunnel syndrome, Chest pain, Constipation, Dizzy spells, Joint pain, Lactose intolerance, Lipoma, Numbness, Obesity, Plantar fasciitis, Seizures (HCC), Shoulder pain, and SOB (shortness of breath) on exertion. here with    No diagnosis found.  Ruther Cower ***.  Healthy lifestyle habits encouraged. *** will follow up with PCP as directed. *** will return to see me in ***, sooner if needed. *** verbalizes understanding and agreement with this plan.   No orders of the defined types were placed in this encounter.    No orders of the defined types were placed in this encounter.    Terrilyn Fick, MSN, FNP-C 05/15/2024, 3:51 PM  [facility] [address] [place], [place] [address] [phone]

## 2024-05-15 NOTE — Patient Instructions (Incomplete)
 Below is our plan:  We will continue levetiracetam 500mg  twice daily. I have placed orders for PT evaluation. Try to exercise for 30 minutes at least 5 days a week.   Please make sure you are consistent with timing of seizure medication. I recommend annual visit with primary care provider (PCP) for complete physical and routine blood work. I recommend daily intake of vitamin D (400-800iu) and calcium (800-1000mg ) for bone health. Discuss Dexa screening with PCP.   According to [place] law, you can not drive unless you are seizure / syncope free for at least 6 months and under physician's care.  Please maintain precautions. Do not participate in activities where a loss of awareness could harm you or someone else. No swimming alone, no tub bathing, no hot tubs, no driving, no operating motorized vehicles (cars, ATVs, motocycles, etc), lawnmowers, power tools or firearms. No standing at heights, such as rooftops, ladders or stairs. Avoid hot objects such as stoves, heaters, open fires. Wear a helmet when riding a bicycle, scooter, skateboard, etc. and avoid areas of traffic. Set your water heater to 120 degrees or less.  SUDEP is the sudden, unexpected death of someone with epilepsy, who was otherwise healthy. In SUDEP cases, no other cause of death is found when an autopsy is done. Each year, more than 1 in 1,000 people with epilepsy die from SUDEP. This is the leading cause of death in people with uncontrolled seizures. Until further answers are available, the best way to prevent SUDEP is to lower your risk by controlling seizures. Research has found that people with all types of epilepsy that experience convulsive seizures can be at risk.  Please make sure you are staying well hydrated. I recommend 50-60 ounces daily. Well balanced diet and regular exercise encouraged. Consistent sleep schedule with 6-8 hours recommended.   Please continue follow up with care team as directed.   Follow up with me in 1  year   You may receive a survey regarding today's visit. I encourage you to leave honest feed back as I do use this information to improve patient care. Thank you for seeing me today!

## 2024-05-16 ENCOUNTER — Ambulatory Visit: Payer: BC Managed Care – PPO | Admitting: Family Medicine

## 2024-06-06 ENCOUNTER — Other Ambulatory Visit: Payer: Self-pay | Admitting: Neurology

## 2024-06-07 ENCOUNTER — Telehealth: Payer: Self-pay

## 2024-06-07 NOTE — Telephone Encounter (Signed)
 Mychart message sent, needs appt fro future refills after this 30 day supply

## 2024-07-03 ENCOUNTER — Other Ambulatory Visit: Payer: Self-pay | Admitting: Neurology

## 2024-07-29 ENCOUNTER — Other Ambulatory Visit: Payer: Self-pay | Admitting: Neurology

## 2024-07-31 ENCOUNTER — Other Ambulatory Visit: Payer: Self-pay | Admitting: Neurology

## 2024-09-22 ENCOUNTER — Emergency Department (HOSPITAL_COMMUNITY)
Admission: EM | Admit: 2024-09-22 | Discharge: 2024-09-27 | Disposition: E | Attending: Emergency Medicine | Admitting: Emergency Medicine

## 2024-09-22 DIAGNOSIS — I469 Cardiac arrest, cause unspecified: Secondary | ICD-10-CM | POA: Insufficient documentation

## 2024-09-22 LAB — I-STAT VENOUS BLOOD GAS, ED
Acid-base deficit: 19 mmol/L — ABNORMAL HIGH (ref 0.0–2.0)
Bicarbonate: 17.4 mmol/L — ABNORMAL LOW (ref 20.0–28.0)
Calcium, Ion: 1.14 mmol/L — ABNORMAL LOW (ref 1.15–1.40)
HCT: 44 % (ref 39.0–52.0)
Hemoglobin: 15 g/dL (ref 13.0–17.0)
O2 Saturation: 27 %
Potassium: 5.7 mmol/L — ABNORMAL HIGH (ref 3.5–5.1)
Sodium: 144 mmol/L (ref 135–145)
TCO2: 20 mmol/L — ABNORMAL LOW (ref 22–32)
pCO2, Ven: 100.4 mmHg (ref 44–60)
pH, Ven: 6.848 — CL (ref 7.25–7.43)
pO2, Ven: 33 mmHg (ref 32–45)

## 2024-09-22 LAB — I-STAT CG4 LACTIC ACID, ED: Lactic Acid, Venous: >15 mmol/L (ref 0.5–1.9)

## 2024-09-22 LAB — I-STAT CHEM 8, ED
BUN: 17 mg/dL (ref 8–23)
Calcium, Ion: 1.14 mmol/L — ABNORMAL LOW (ref 1.15–1.40)
Chloride: 108 mmol/L (ref 98–111)
Creatinine, Ser: 1.5 mg/dL — ABNORMAL HIGH (ref 0.61–1.24)
Glucose, Bld: 104 mg/dL — ABNORMAL HIGH (ref 70–99)
HCT: 45 % (ref 39.0–52.0)
Hemoglobin: 15.3 g/dL (ref 13.0–17.0)
Potassium: 5.7 mmol/L — ABNORMAL HIGH (ref 3.5–5.1)
Sodium: 144 mmol/L (ref 135–145)
TCO2: 20 mmol/L — ABNORMAL LOW (ref 22–32)

## 2024-09-22 MED ORDER — EPINEPHRINE 1 MG/10ML IV SOSY
PREFILLED_SYRINGE | INTRAVENOUS | Status: AC | PRN
Start: 2024-09-22 — End: 2024-09-22
  Administered 2024-09-22: 1 mg via INTRAVENOUS

## 2024-09-22 MED ORDER — EPINEPHRINE 1 MG/10ML IV SOSY
PREFILLED_SYRINGE | INTRAVENOUS | Status: AC | PRN
  Administered 2024-09-22 (×2): 1 mg via INTRAVENOUS

## 2024-09-22 MED ORDER — SODIUM BICARBONATE 8.4 % IV SOLN
INTRAVENOUS | Status: AC | PRN
  Administered 2024-09-22: 50 meq via INTRAVENOUS

## 2024-09-22 MED ORDER — SODIUM BICARBONATE 8.4 % IV SOLN
INTRAVENOUS | Status: AC | PRN
Start: 2024-09-22 — End: 2024-09-22
  Administered 2024-09-22: 100 meq via INTRAVENOUS

## 2024-09-27 NOTE — ED Provider Notes (Addendum)
 [facility]PT [facility] Emergency Department Provider Note MRN:  [id number]  Arrival date & time: 09/26/2024     Chief Complaint   Cardiac arrest History of Present Illness   Bryan Lee is a 63 y.o. year-old male with a history of obesity, OSA presenting to the ED with chief complaint of cardiac arrest.  Patient rolled and fell out of bed, witnessed by wife.  Was unresponsive.  Became pulseless with EMS.  Review of Systems  A thorough review of systems was obtained and all systems are negative except as noted in the HPI and PMH.   Patient's Health History   No past medical history on file.    No family history on file.  Social History   Socioeconomic History   Marital status: Married    Spouse name: Not on file   Number of children: Not on file   Years of education: Not on file   Highest education level: Not on file  Occupational History   Not on file  Tobacco Use   Smoking status: Not on file   Smokeless tobacco: Not on file  Substance and Sexual Activity   Alcohol use: Not on file   Drug use: Not on file   Sexual activity: Not on file  Other Topics Concern   Not on file  Social History Narrative   Not on file   Social Drivers of Health   Financial Resource Strain: Not on file  Food Insecurity: Not on file  Transportation Needs: Not on file  Physical Activity: Not on file  Stress: Not on file  Social Connections: Not on file  Intimate Partner Violence: Not on file     Physical Exam  There were no vitals filed for this visit.  CONSTITUTIONAL: ill-appearing NEURO/PSYCH: Unresponsive EYES: Fixed gaze ENT/NECK:  no LAD, no JVD CARDIO: Pulseless, poorly perfused PULM:  CTAB no wheezing or rhonchi GI/GU:  non-distended MSK/SPINE:  No gross deformities, no edema SKIN:  no rash, atraumatic   *Additional and/or pertinent findings included in MDM below  Diagnostic and Interventional Summary    EKG Interpretation Date/Time:     Ventricular Rate:    PR Interval:    QRS Duration:    QT Interval:    QTC Calculation:   R Axis:      Text Interpretation:         Labs Reviewed  I-STAT CHEM 8, ED - Abnormal; Notable for the following components:      Result Value   Potassium 5.7 (*)    Creatinine, Ser 1.50 (*)    Glucose, Bld 104 (*)    Calcium , Ion 1.14 (*)    TCO2 20 (*)    All other components within normal limits  I-STAT VENOUS BLOOD GAS, ED - Abnormal; Notable for the following components:   pH, Ven 6.848 (*)    pCO2, Ven 100.4 (*)    Bicarbonate 17.4 (*)    TCO2 20 (*)    Acid-base deficit 19.0 (*)    Potassium 5.7 (*)    Calcium , Ion 1.14 (*)    All other components within normal limits  I-STAT CG4 LACTIC ACID, ED - Abnormal; Notable for the following components:   Lactic Acid, Venous >15.0 (*)    All other components within normal limits    No orders to display    Medications  EPINEPHrine  (ADRENALIN ) 1 MG/10ML injection (1 mg Intravenous Given 09/26/2024 0112)  sodium bicarbonate  injection (50 mEq Intravenous Given 09/09/2024 0108)  EPINEPHrine  (  ADRENALIN ) 1 MG/10ML injection (1 mg Intravenous Given 09/03/2024 0108)  EPINEPHrine  (ADRENALIN ) 1 MG/10ML injection (1 mg Intravenous Given 09/24/2024 0114)  sodium bicarbonate  injection (100 mEq Intravenous Given 09/21/2024 0120)     Procedures  /  Critical Care .Critical Care  Performed by: Theadore Ozell HERO, MD Authorized by: Theadore Ozell HERO, MD   Critical care provider statement:    Critical care time (minutes):  48   Critical care was necessary to treat or prevent imminent or life-threatening deterioration of the following conditions:  Cardiac failure   Critical care was time spent personally by me on the following activities:  Development of treatment plan with patient or surrogate, discussions with consultants, evaluation of patient's response to treatment, examination of patient, ordering and review of laboratory studies, ordering and review of  radiographic studies, ordering and performing treatments and interventions, pulse oximetry, re-evaluation of patient's condition and review of old charts CPR  Date/Time: 09/11/2024 1:54 AM  Performed by: Theadore Ozell HERO, MD Authorized by: Theadore Ozell HERO, MD  CPR Procedure Details:      Amount of time prior to administration of ACLS/BLS (minutes):  0   ACLS/BLS initiated by EMS: Yes     CPR/ACLS performed in the ED: Yes     Duration of CPR (minutes):  29   Outcome: Pt declared dead    CPR performed via ACLS guidelines under my direct supervision.  See RN documentation for details including defibrillator use, medications, doses and timing. Ultrasound ED Echo  Date/Time: 08/31/2024 1:55 AM  Performed by: Theadore Ozell HERO, MD Authorized by: Theadore Ozell HERO, MD   Procedure details:    Indications: cardiac arrest     Views: apical 4 chamber view     Limitations:  Acoustic shadowing and body habitus Findings:    Cardiac Activity: no cardiac activity   Procedure Name: Intubation Date/Time: 09/14/2024 2:22 AM  Performed by: Theadore Ozell HERO, MDPre-anesthesia Checklist: Patient identified, Patient being monitored, Emergency Drugs available, Timeout performed and Suction available Oxygen Delivery Method: Non-rebreather mask Preoxygenation: Pre-oxygenation with 100% oxygen Induction Type: Rapid sequence Ventilation: Mask ventilation without difficulty Laryngoscope Size: Glidescope and 4 Grade View: Grade I Tube size: 7.5 mm Number of attempts: 1 Airway Equipment and Method: Rigid stylet Placement Confirmation: ETT inserted through vocal cords under direct vision, CO2 detector and Breath sounds checked- equal and bilateral Secured at: 25 cm Tube secured with: ETT holder Comments: ET tube placed successfully during CPR, no medications required.      ED Course and Medical Decision Making  Initial Impression and Ddx Patient presenting with cardiac arrest, unclear cause.  History of  obesity, sleep apnea, uses CPAP at night.  No significant evidence of trauma on exam.  CPR initiated promptly by EMS upon loss of pulses.  Difficulty obtaining IV access on the way here.  Also difficulty obtaining airway support on the way here given patient's body habitus.  Unable to pass Igel per EMS report.  Immediately upon arrival, CPR was continued, initial rhythm PEA.  Able to quickly pass the ET tube without issue.  Past medical/surgical history that increases complexity of ED encounter: OSA, obesity  Interpretation of Diagnostics I personally reviewed the Laboratory Testing and my interpretation is as follows: Profound lactic acidosis    Patient Reassessment and Ultimate Disposition/Management     CPR continued in the emergency department from 1:04 AM until 1:33 AM.  Patient had consistent asystole during this time during pulse checks, received epinephrine  x 4.  Bicarb  x 2.  Slow downtrending of end-tidal CO2.  And on multiple checks cardiac standstill on bedside ultrasound.  Family at bedside.  Patient deceased, time of death 1:33 AM.  Patient management required discussion with the following services or consulting groups:  None  Complexity of Problems Addressed Acute illness or injury that poses threat of life of bodily function  Additional Data Reviewed and Analyzed Further history obtained from: EMS on arrival and Further history from spouse/family member  Additional Factors Impacting ED Encounter Risk Consideration of hospitalization  Ozell HERO. Theadore, MD [facility] Emergency Medicine [facility] [email or web address]  Final Clinical Impressions(s) / ED Diagnoses     ICD-10-CM   1. Cardiac arrest [facility])  I46.9       ED Discharge Orders     None        Discharge Instructions Discussed with and Provided to Patient:   Discharge Instructions   None      Theadore Ozell HERO, MD 09/04/2024 [id number]    Theadore Ozell HERO, MD 09/10/2024 [phone]

## 2024-09-27 NOTE — ED Triage Notes (Signed)
 Pt BIB [facility] CPR in progress. Pt was in bed and took his sleep medications. Per wife, pt rolled out of bed and pt became unresponsive and apneic. EMS started bagging on arrival but pt had a pulse. Pulse was lost en route.

## 2024-09-27 NOTE — Code Documentation (Signed)
Pulse check. Asystole on monitor. 

## 2024-09-27 NOTE — ED Notes (Signed)
 Called [facility]. Case # [id number]

## 2024-09-27 DEATH — deceased
